# Patient Record
Sex: Male | Born: 1975 | Race: White | Hispanic: No | Smoking: Current every day smoker
Health system: Southern US, Community
[De-identification: ages and names within clinical notes are randomized; demographics above are authoritative.]

## PROBLEM LIST (undated history)

## (undated) DIAGNOSIS — K219 Gastro-esophageal reflux disease without esophagitis: Secondary | ICD-10-CM

## (undated) DIAGNOSIS — M533 Sacrococcygeal disorders, not elsewhere classified: Secondary | ICD-10-CM

## (undated) DIAGNOSIS — M5126 Other intervertebral disc displacement, lumbar region: Secondary | ICD-10-CM

## (undated) DIAGNOSIS — M47816 Spondylosis without myelopathy or radiculopathy, lumbar region: Secondary | ICD-10-CM

## (undated) DIAGNOSIS — IMO0002 Reserved for concepts with insufficient information to code with codable children: Secondary | ICD-10-CM

## (undated) DIAGNOSIS — M545 Low back pain, unspecified: Secondary | ICD-10-CM

## (undated) HISTORY — DX: Sacrococcygeal disorders, not elsewhere classified: M53.3

## (undated) HISTORY — DX: Reserved for concepts with insufficient information to code with codable children: IMO0002

## (undated) HISTORY — DX: Low back pain, unspecified: M54.50

## (undated) HISTORY — DX: Other intervertebral disc displacement, lumbar region: M51.26

## (undated) HISTORY — DX: Low back pain: M54.5

## (undated) HISTORY — DX: Spondylosis without myelopathy or radiculopathy, lumbar region: M47.816

---

## 2002-11-18 ENCOUNTER — Emergency Department (HOSPITAL_COMMUNITY): Admission: EM | Admit: 2002-11-18 | Discharge: 2002-11-19 | Payer: Self-pay | Admitting: Emergency Medicine

## 2002-11-19 ENCOUNTER — Encounter: Payer: Self-pay | Admitting: Emergency Medicine

## 2005-04-06 ENCOUNTER — Emergency Department (HOSPITAL_COMMUNITY): Admission: EM | Admit: 2005-04-06 | Discharge: 2005-04-06 | Payer: Self-pay | Admitting: Emergency Medicine

## 2005-07-11 ENCOUNTER — Ambulatory Visit (HOSPITAL_COMMUNITY): Admission: RE | Admit: 2005-07-11 | Discharge: 2005-07-11 | Payer: Self-pay | Admitting: Family Medicine

## 2005-10-04 ENCOUNTER — Ambulatory Visit: Payer: Self-pay | Admitting: Physical Medicine & Rehabilitation

## 2005-10-04 ENCOUNTER — Encounter
Admission: RE | Admit: 2005-10-04 | Discharge: 2006-01-02 | Payer: Self-pay | Admitting: Physical Medicine & Rehabilitation

## 2005-10-14 ENCOUNTER — Encounter
Admission: RE | Admit: 2005-10-14 | Discharge: 2005-11-18 | Payer: Self-pay | Admitting: Physical Medicine & Rehabilitation

## 2005-11-08 ENCOUNTER — Ambulatory Visit: Payer: Self-pay | Admitting: Physical Medicine & Rehabilitation

## 2005-12-09 ENCOUNTER — Ambulatory Visit: Payer: Self-pay | Admitting: Physical Medicine & Rehabilitation

## 2006-02-03 ENCOUNTER — Encounter
Admission: RE | Admit: 2006-02-03 | Discharge: 2006-05-04 | Payer: Self-pay | Admitting: Physical Medicine & Rehabilitation

## 2006-02-03 ENCOUNTER — Ambulatory Visit: Payer: Self-pay | Admitting: Physical Medicine & Rehabilitation

## 2006-05-01 ENCOUNTER — Ambulatory Visit: Payer: Self-pay | Admitting: Physical Medicine & Rehabilitation

## 2006-05-01 ENCOUNTER — Encounter
Admission: RE | Admit: 2006-05-01 | Discharge: 2006-07-30 | Payer: Self-pay | Admitting: Physical Medicine & Rehabilitation

## 2006-05-27 ENCOUNTER — Ambulatory Visit: Payer: Self-pay | Admitting: Physical Medicine & Rehabilitation

## 2006-07-23 ENCOUNTER — Ambulatory Visit: Payer: Self-pay | Admitting: Physical Medicine & Rehabilitation

## 2006-08-13 ENCOUNTER — Encounter
Admission: RE | Admit: 2006-08-13 | Discharge: 2006-11-11 | Payer: Self-pay | Admitting: Physical Medicine & Rehabilitation

## 2006-09-17 ENCOUNTER — Ambulatory Visit: Payer: Self-pay | Admitting: Physical Medicine & Rehabilitation

## 2006-11-12 ENCOUNTER — Encounter
Admission: RE | Admit: 2006-11-12 | Discharge: 2007-02-10 | Payer: Self-pay | Admitting: Physical Medicine & Rehabilitation

## 2006-11-12 ENCOUNTER — Ambulatory Visit: Payer: Self-pay | Admitting: Physical Medicine & Rehabilitation

## 2007-01-07 ENCOUNTER — Emergency Department (HOSPITAL_COMMUNITY): Admission: EM | Admit: 2007-01-07 | Discharge: 2007-01-07 | Payer: Self-pay | Admitting: Emergency Medicine

## 2007-01-09 ENCOUNTER — Ambulatory Visit: Payer: Self-pay | Admitting: Physical Medicine & Rehabilitation

## 2007-01-27 ENCOUNTER — Emergency Department (HOSPITAL_COMMUNITY): Admission: EM | Admit: 2007-01-27 | Discharge: 2007-01-27 | Payer: Self-pay | Admitting: Emergency Medicine

## 2007-02-01 ENCOUNTER — Ambulatory Visit (HOSPITAL_COMMUNITY): Admission: RE | Admit: 2007-02-01 | Discharge: 2007-02-01 | Payer: Self-pay | Admitting: Family Medicine

## 2007-02-10 ENCOUNTER — Inpatient Hospital Stay (HOSPITAL_COMMUNITY): Admission: RE | Admit: 2007-02-10 | Discharge: 2007-02-12 | Payer: Self-pay | Admitting: Orthopedic Surgery

## 2007-02-25 ENCOUNTER — Encounter
Admission: RE | Admit: 2007-02-25 | Discharge: 2007-03-02 | Payer: Self-pay | Admitting: Physical Medicine & Rehabilitation

## 2007-03-02 ENCOUNTER — Ambulatory Visit: Payer: Self-pay | Admitting: Physical Medicine & Rehabilitation

## 2007-03-30 ENCOUNTER — Encounter
Admission: RE | Admit: 2007-03-30 | Discharge: 2007-06-28 | Payer: Self-pay | Admitting: Physical Medicine & Rehabilitation

## 2007-03-30 ENCOUNTER — Ambulatory Visit: Payer: Self-pay | Admitting: Physical Medicine & Rehabilitation

## 2007-05-08 ENCOUNTER — Ambulatory Visit (HOSPITAL_COMMUNITY): Admission: RE | Admit: 2007-05-08 | Discharge: 2007-05-08 | Payer: Self-pay | Admitting: Orthopedic Surgery

## 2007-05-08 ENCOUNTER — Encounter (INDEPENDENT_AMBULATORY_CARE_PROVIDER_SITE_OTHER): Payer: Self-pay | Admitting: Orthopedic Surgery

## 2007-05-08 ENCOUNTER — Ambulatory Visit: Payer: Self-pay | Admitting: Surgery

## 2007-05-18 ENCOUNTER — Ambulatory Visit: Payer: Self-pay | Admitting: Physical Medicine & Rehabilitation

## 2007-06-29 ENCOUNTER — Encounter: Admission: RE | Admit: 2007-06-29 | Discharge: 2007-09-27 | Payer: Self-pay | Admitting: Urology

## 2007-07-01 ENCOUNTER — Ambulatory Visit: Payer: Self-pay | Admitting: Physical Medicine & Rehabilitation

## 2007-07-29 ENCOUNTER — Ambulatory Visit: Payer: Self-pay | Admitting: Physical Medicine & Rehabilitation

## 2007-08-27 ENCOUNTER — Ambulatory Visit: Payer: Self-pay | Admitting: Physical Medicine & Rehabilitation

## 2007-09-29 ENCOUNTER — Encounter
Admission: RE | Admit: 2007-09-29 | Discharge: 2007-11-27 | Payer: Self-pay | Admitting: Physical Medicine & Rehabilitation

## 2007-09-30 ENCOUNTER — Ambulatory Visit: Payer: Self-pay | Admitting: Physical Medicine & Rehabilitation

## 2007-10-28 ENCOUNTER — Ambulatory Visit: Payer: Self-pay | Admitting: Physical Medicine & Rehabilitation

## 2007-11-27 ENCOUNTER — Ambulatory Visit: Payer: Self-pay | Admitting: Physical Medicine & Rehabilitation

## 2007-12-25 ENCOUNTER — Encounter
Admission: RE | Admit: 2007-12-25 | Discharge: 2008-03-24 | Payer: Self-pay | Admitting: Physical Medicine & Rehabilitation

## 2007-12-28 ENCOUNTER — Ambulatory Visit: Payer: Self-pay | Admitting: Physical Medicine & Rehabilitation

## 2008-01-27 ENCOUNTER — Ambulatory Visit: Payer: Self-pay | Admitting: Physical Medicine & Rehabilitation

## 2008-02-24 ENCOUNTER — Ambulatory Visit: Payer: Self-pay | Admitting: Physical Medicine & Rehabilitation

## 2008-03-11 HISTORY — PX: KNEE SURGERY: SHX244

## 2008-03-30 ENCOUNTER — Ambulatory Visit: Payer: Self-pay | Admitting: Physical Medicine & Rehabilitation

## 2008-03-30 ENCOUNTER — Encounter
Admission: RE | Admit: 2008-03-30 | Discharge: 2008-06-15 | Payer: Self-pay | Admitting: Physical Medicine & Rehabilitation

## 2008-04-27 ENCOUNTER — Ambulatory Visit: Payer: Self-pay | Admitting: Physical Medicine & Rehabilitation

## 2008-05-25 ENCOUNTER — Ambulatory Visit: Payer: Self-pay | Admitting: Physical Medicine & Rehabilitation

## 2008-06-15 ENCOUNTER — Encounter
Admission: RE | Admit: 2008-06-15 | Discharge: 2008-06-21 | Payer: Self-pay | Admitting: Physical Medicine & Rehabilitation

## 2008-06-21 ENCOUNTER — Ambulatory Visit: Payer: Self-pay | Admitting: Physical Medicine & Rehabilitation

## 2008-08-10 ENCOUNTER — Encounter
Admission: RE | Admit: 2008-08-10 | Discharge: 2008-11-08 | Payer: Self-pay | Admitting: Physical Medicine & Rehabilitation

## 2008-08-10 ENCOUNTER — Ambulatory Visit: Payer: Self-pay | Admitting: Physical Medicine & Rehabilitation

## 2008-08-16 IMAGING — CR DG CHEST 2V
2 series · 2 of 2 positions shown · non-contrast
Comparison: none

CLINICAL DATA: PCL avulsion, preop. Tobacco use.

Chest 2 view:
Comparison CT 01/27/2007. The heart size and mediastinal contours are within
normal limits.  Both lungs are clear.  The visualized skeletal structures are
unremarkable.

[view not recorded (1 of 2)]
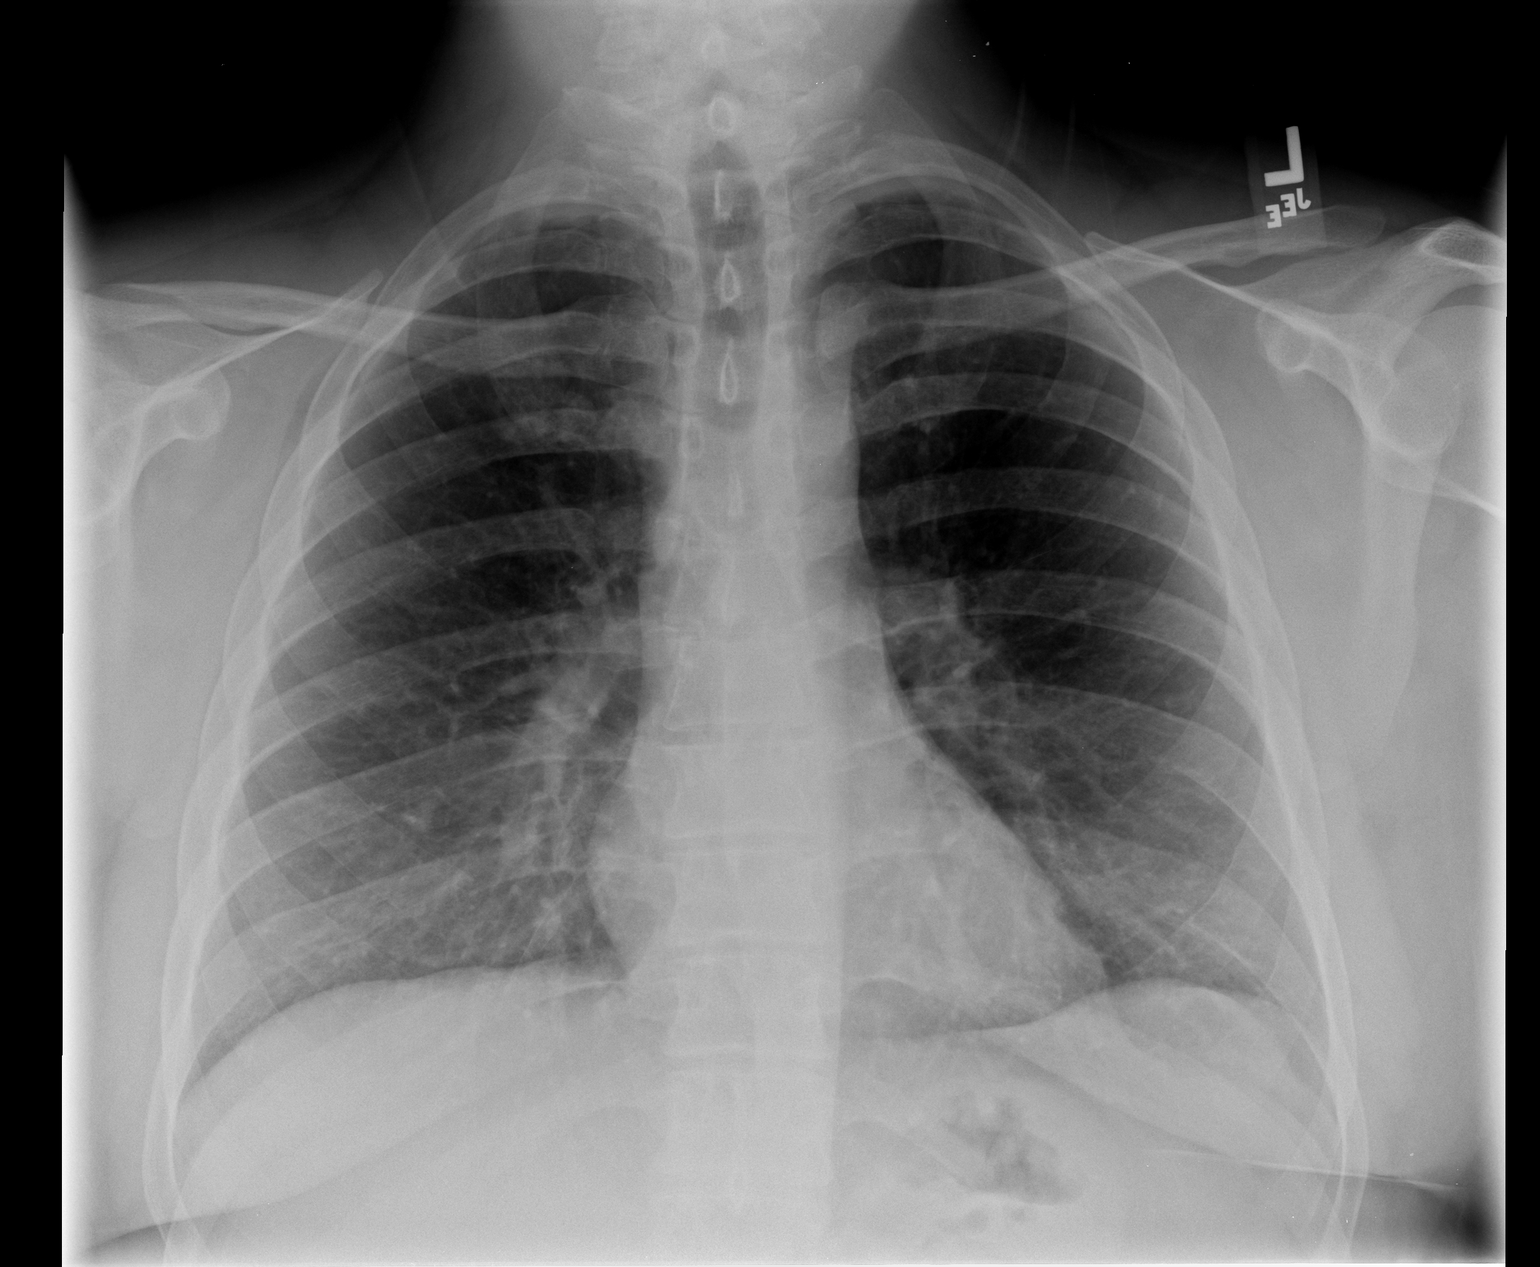

[view not recorded (2 of 2)]
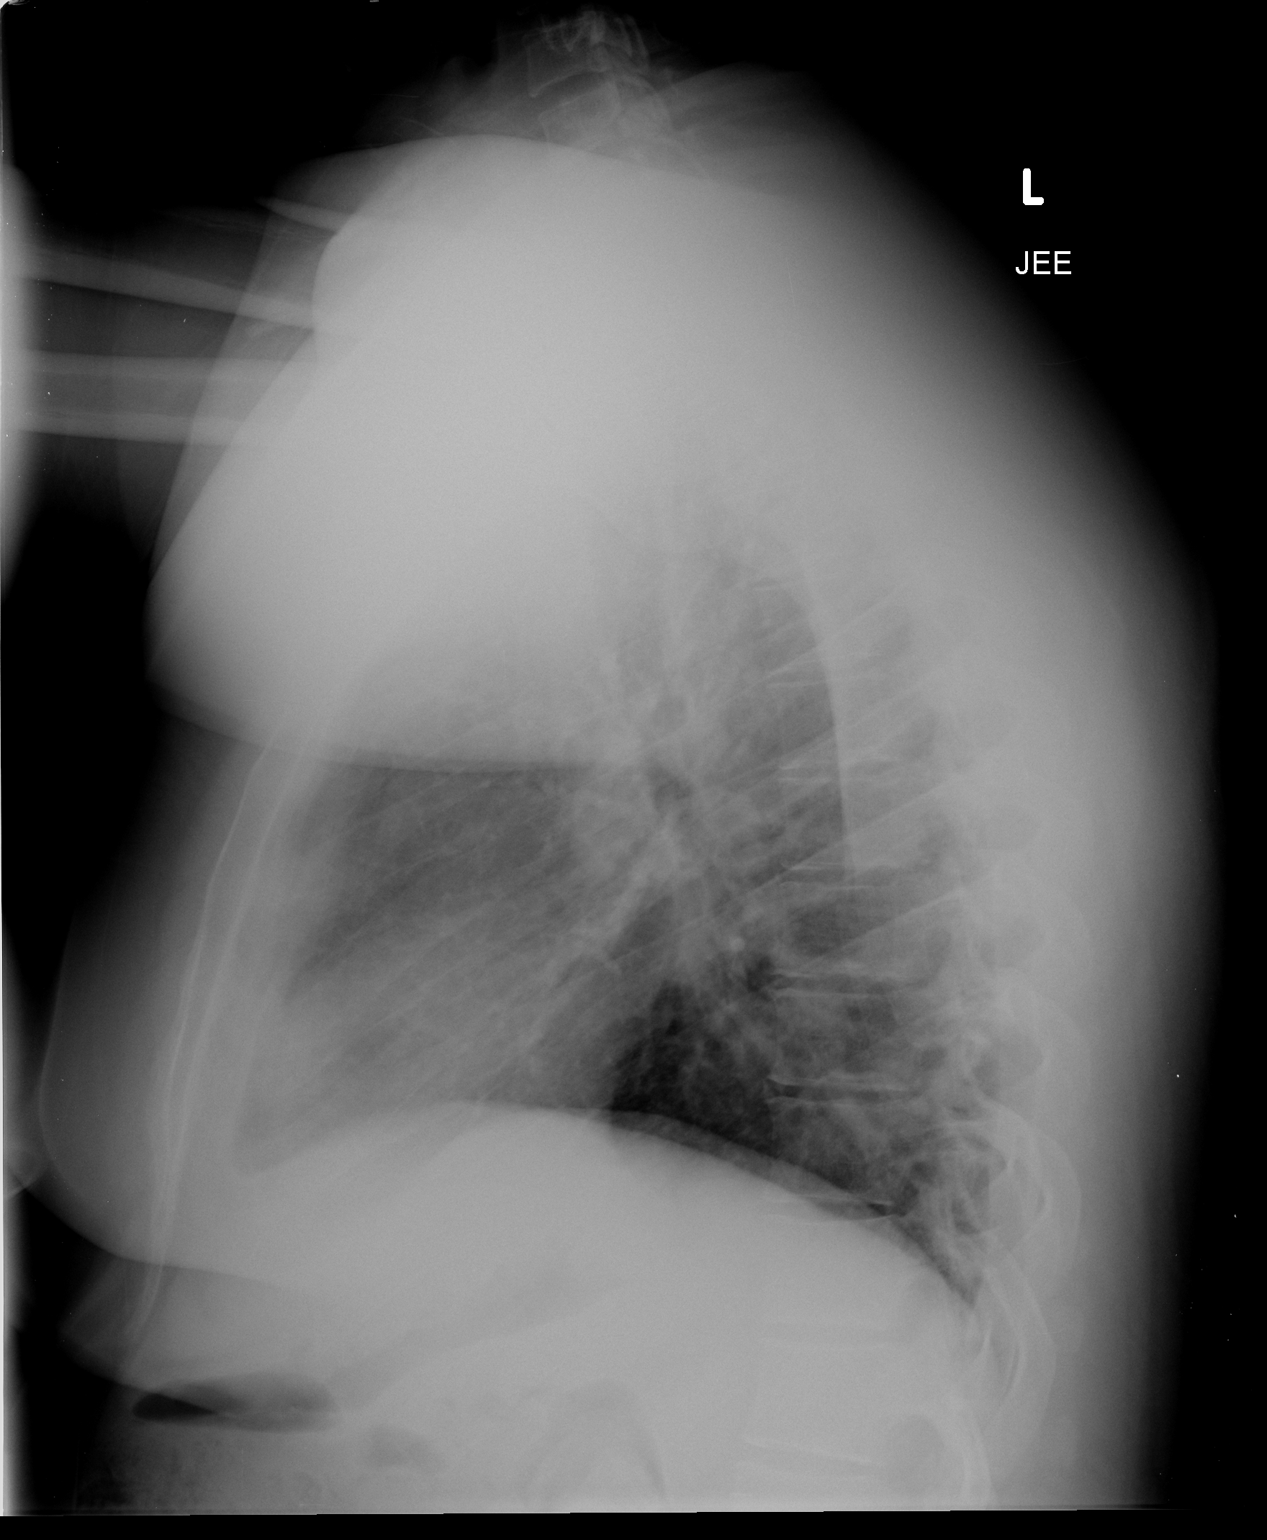

[2 of 2 positions shown; findings below may reference images not displayed]

IMPRESSION: 1. No active cardiopulmonary disease.

## 2008-08-22 IMAGING — CR DG KNEE 1-2V PORT*L*
2 series · 2 of 2 positions shown · non-contrast
Comparison: none

CLINICAL DATA: Left PCL avulsion.  Post-op.
 LEFT KNEE ? 2 VIEWS:
 Limb is in a plaster cast.  There is a fully-threaded screw extending from posterior to anterior within the proximal left tibial metaphysis.  Screw extends slightly downward and medially.

[ap/obl knee]
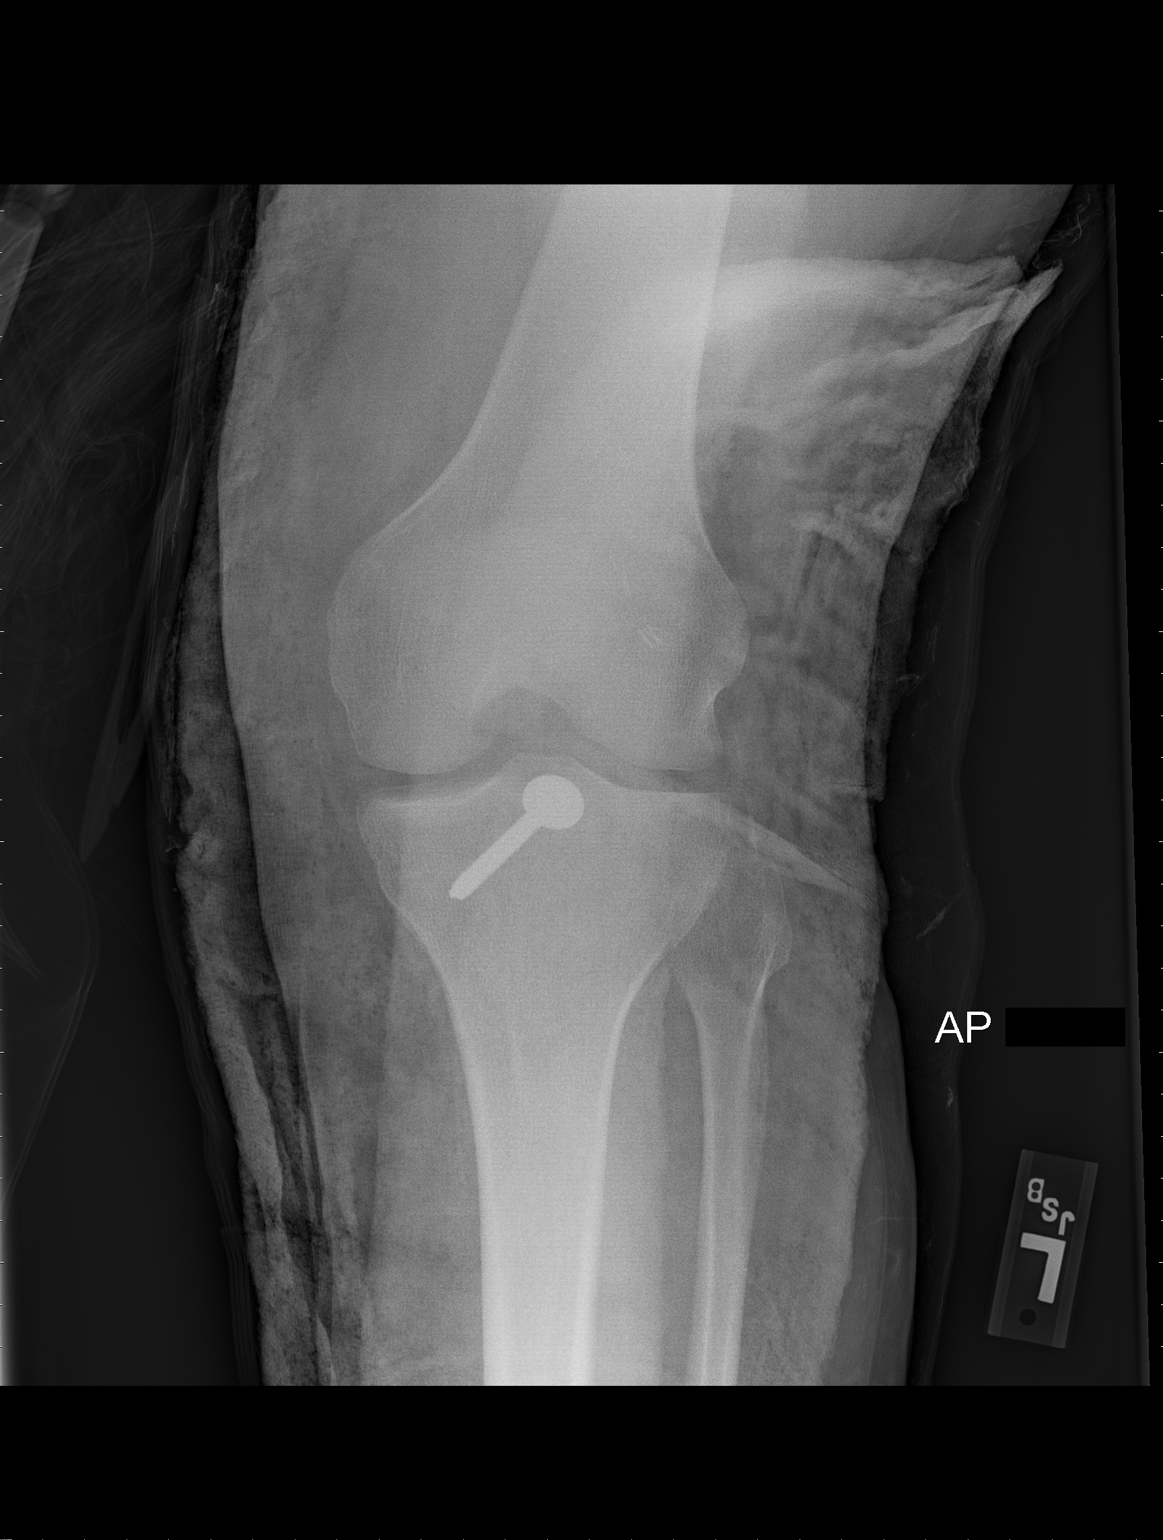

[knee lat]
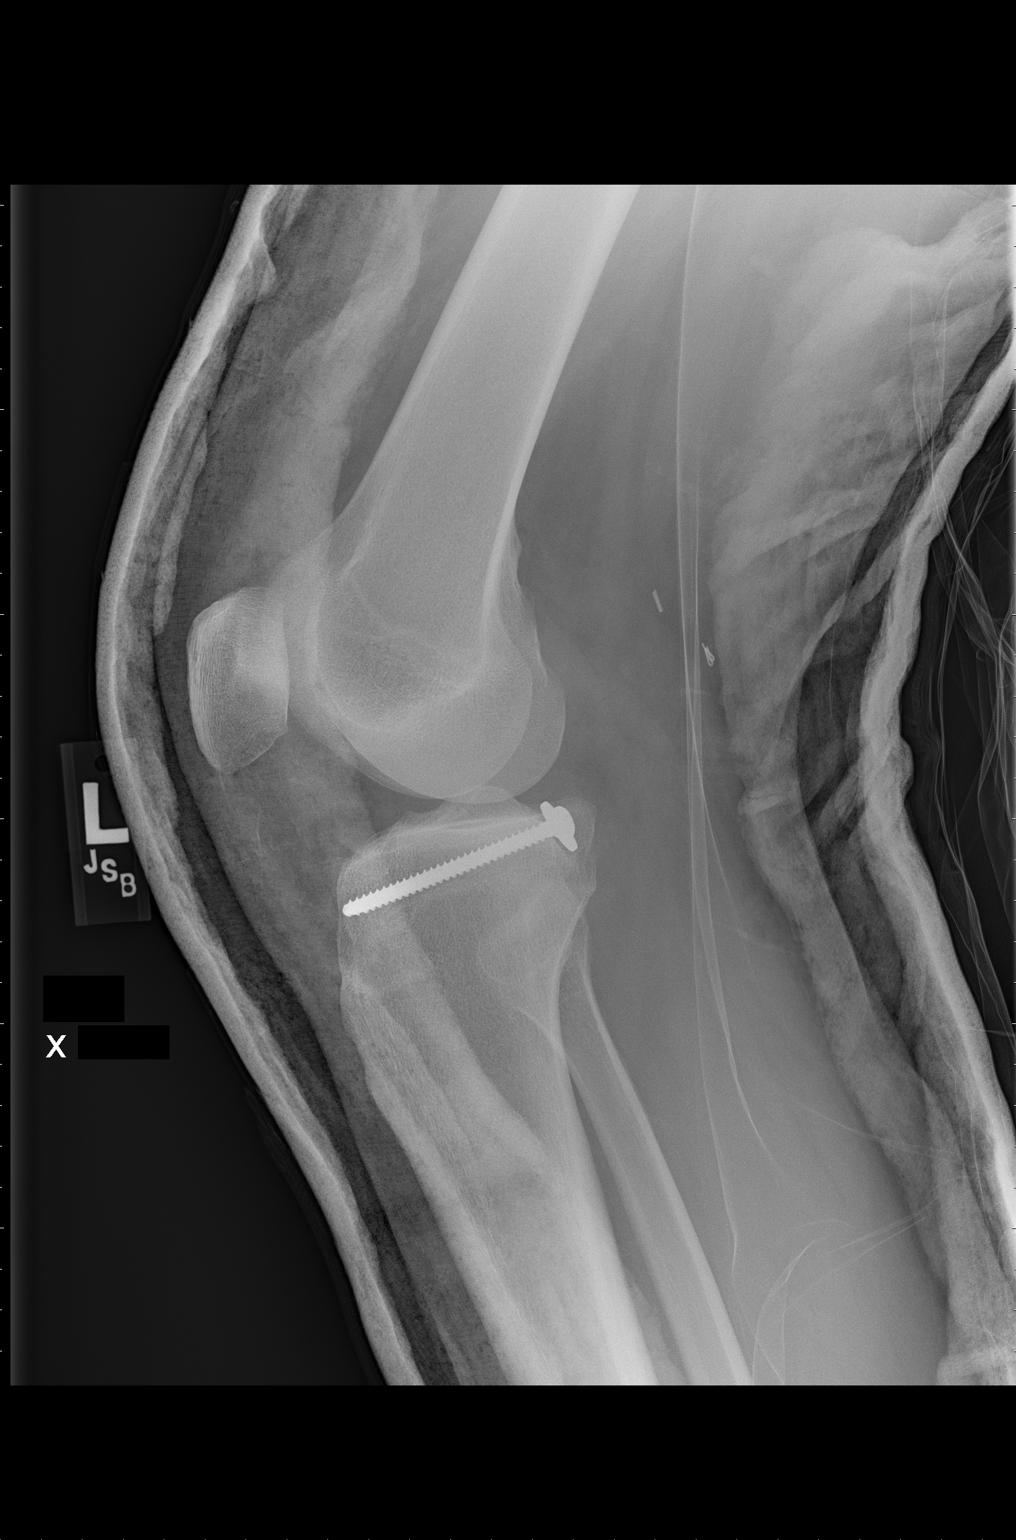

[2 of 2 positions shown; findings below may reference images not displayed]

IMPRESSION: Status post placement of proximal left tibial metaphyseal screw for reduction of reported PCL avulsion.

## 2008-08-22 IMAGING — RF DG TIBIA/FIBULA 2V*L*
1 series · 2 of 2 positions shown · non-contrast
Comparison: none

CLINICAL DATA: Posterior cruciate ligament avulsion.
 LEFT TIBIA/FIBULA ? 2 VIEW:

[Series 1: run · 2 of 2 slices shown]
[im 1/2]
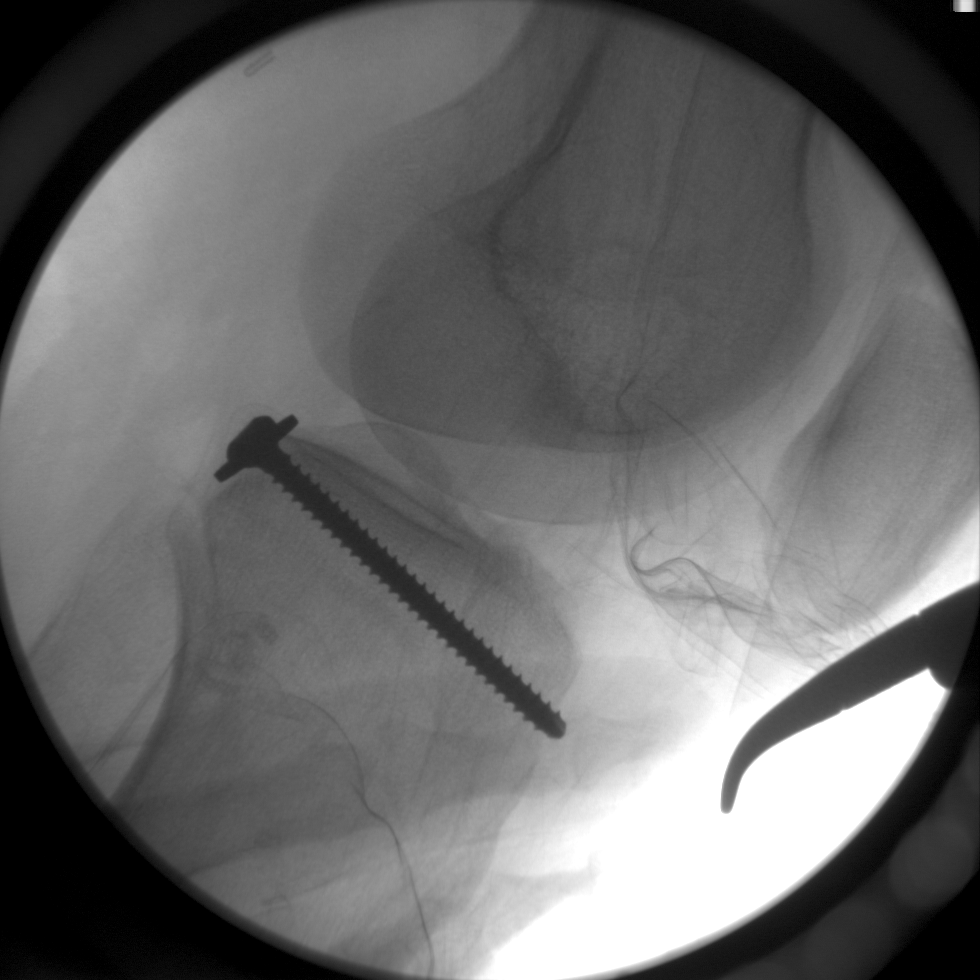
[im 2/2]
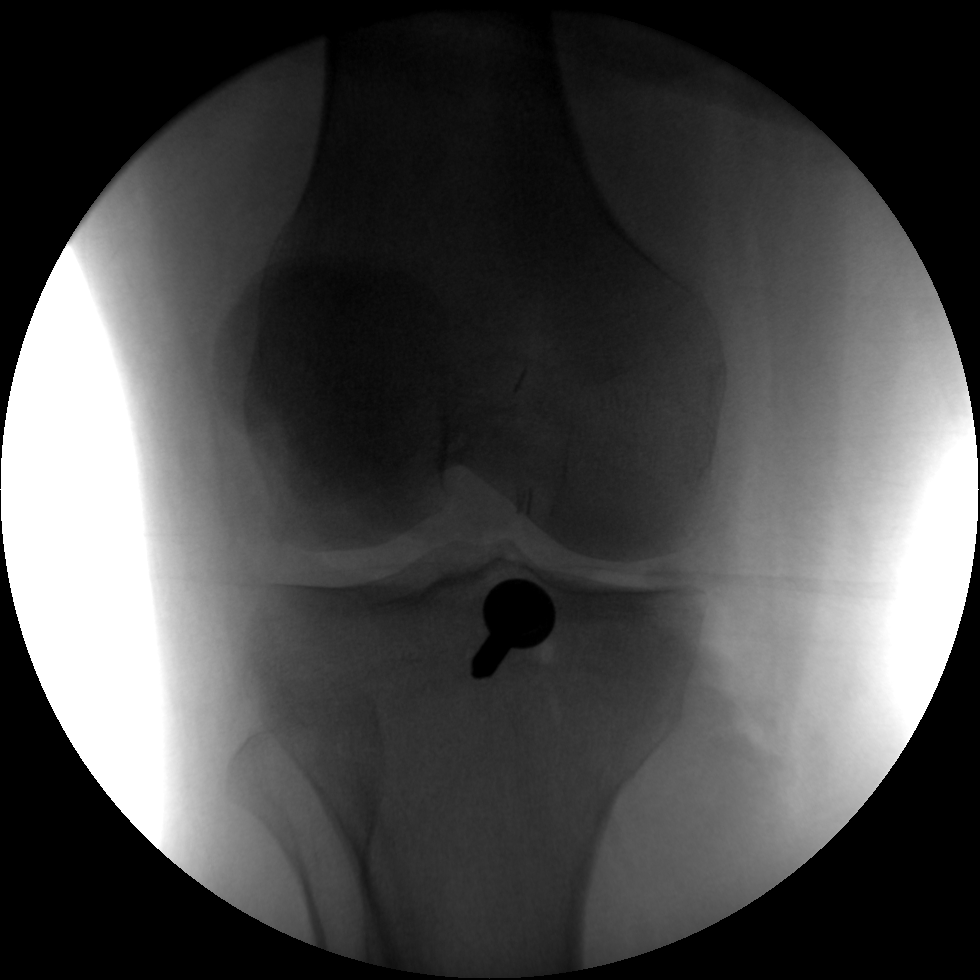

[2 of 2 positions shown; findings below may reference images not displayed]

FINDINGS: Two C-arm spot films of the left knee were obtained.  These show placement of screw for fixation of the left posterior cruciate ligament.
IMPRESSION: Fixation screw posteriorly within the proximal left tibia.

## 2008-09-07 ENCOUNTER — Ambulatory Visit: Payer: Self-pay | Admitting: Physical Medicine & Rehabilitation

## 2008-10-06 ENCOUNTER — Ambulatory Visit: Payer: Self-pay | Admitting: Physical Medicine & Rehabilitation

## 2008-11-04 ENCOUNTER — Encounter
Admission: RE | Admit: 2008-11-04 | Discharge: 2009-01-27 | Payer: Self-pay | Admitting: Physical Medicine & Rehabilitation

## 2008-11-07 ENCOUNTER — Ambulatory Visit: Payer: Self-pay | Admitting: Physical Medicine & Rehabilitation

## 2008-12-06 ENCOUNTER — Ambulatory Visit: Payer: Self-pay | Admitting: Physical Medicine & Rehabilitation

## 2009-01-03 ENCOUNTER — Ambulatory Visit: Payer: Self-pay | Admitting: Physical Medicine & Rehabilitation

## 2009-01-27 ENCOUNTER — Encounter
Admission: RE | Admit: 2009-01-27 | Discharge: 2009-03-01 | Payer: Self-pay | Admitting: Physical Medicine & Rehabilitation

## 2009-01-30 ENCOUNTER — Ambulatory Visit: Payer: Self-pay | Admitting: Physical Medicine & Rehabilitation

## 2009-03-01 ENCOUNTER — Ambulatory Visit: Payer: Self-pay | Admitting: Physical Medicine & Rehabilitation

## 2009-03-23 ENCOUNTER — Encounter
Admission: RE | Admit: 2009-03-23 | Discharge: 2009-06-20 | Payer: Self-pay | Admitting: Physical Medicine & Rehabilitation

## 2009-03-29 ENCOUNTER — Ambulatory Visit: Payer: Self-pay | Admitting: Physical Medicine & Rehabilitation

## 2009-04-26 ENCOUNTER — Ambulatory Visit: Payer: Self-pay | Admitting: Physical Medicine & Rehabilitation

## 2009-05-25 ENCOUNTER — Ambulatory Visit: Payer: Self-pay | Admitting: Physical Medicine & Rehabilitation

## 2009-06-20 ENCOUNTER — Encounter
Admission: RE | Admit: 2009-06-20 | Discharge: 2009-09-12 | Payer: Self-pay | Admitting: Physical Medicine & Rehabilitation

## 2009-06-22 ENCOUNTER — Ambulatory Visit: Payer: Self-pay | Admitting: Physical Medicine & Rehabilitation

## 2009-07-20 ENCOUNTER — Ambulatory Visit: Payer: Self-pay | Admitting: Physical Medicine & Rehabilitation

## 2009-08-21 ENCOUNTER — Ambulatory Visit: Payer: Self-pay | Admitting: Physical Medicine & Rehabilitation

## 2009-09-12 ENCOUNTER — Encounter
Admission: RE | Admit: 2009-09-12 | Discharge: 2009-12-11 | Payer: Self-pay | Admitting: Physical Medicine & Rehabilitation

## 2009-09-18 ENCOUNTER — Ambulatory Visit: Payer: Self-pay | Admitting: Physical Medicine & Rehabilitation

## 2009-11-02 ENCOUNTER — Ambulatory Visit: Payer: Self-pay | Admitting: Physical Medicine & Rehabilitation

## 2009-11-30 ENCOUNTER — Ambulatory Visit: Payer: Self-pay | Admitting: Physical Medicine & Rehabilitation

## 2009-12-21 ENCOUNTER — Encounter
Admission: RE | Admit: 2009-12-21 | Discharge: 2010-02-22 | Payer: Self-pay | Source: Home / Self Care | Attending: Physical Medicine & Rehabilitation | Admitting: Physical Medicine & Rehabilitation

## 2009-12-29 ENCOUNTER — Ambulatory Visit: Payer: Self-pay | Admitting: Physical Medicine & Rehabilitation

## 2010-01-25 ENCOUNTER — Ambulatory Visit: Payer: Self-pay | Admitting: Physical Medicine & Rehabilitation

## 2010-02-22 ENCOUNTER — Ambulatory Visit: Payer: Self-pay | Admitting: Physical Medicine & Rehabilitation

## 2010-03-26 ENCOUNTER — Encounter
Admission: RE | Admit: 2010-03-26 | Discharge: 2010-04-10 | Payer: Self-pay | Source: Home / Self Care | Attending: Physical Medicine & Rehabilitation | Admitting: Physical Medicine & Rehabilitation

## 2010-03-28 ENCOUNTER — Ambulatory Visit: Admit: 2010-03-28 | Payer: Self-pay | Admitting: Physical Medicine & Rehabilitation

## 2010-04-24 ENCOUNTER — Ambulatory Visit (HOSPITAL_BASED_OUTPATIENT_CLINIC_OR_DEPARTMENT_OTHER): Payer: Self-pay | Admitting: Physical Medicine & Rehabilitation

## 2010-04-24 ENCOUNTER — Encounter: Payer: Self-pay | Attending: Physical Medicine & Rehabilitation

## 2010-04-24 DIAGNOSIS — M5126 Other intervertebral disc displacement, lumbar region: Secondary | ICD-10-CM

## 2010-04-24 DIAGNOSIS — M79609 Pain in unspecified limb: Secondary | ICD-10-CM | POA: Insufficient documentation

## 2010-04-24 DIAGNOSIS — G5600 Carpal tunnel syndrome, unspecified upper limb: Secondary | ICD-10-CM | POA: Insufficient documentation

## 2010-04-24 DIAGNOSIS — E669 Obesity, unspecified: Secondary | ICD-10-CM | POA: Insufficient documentation

## 2010-04-24 DIAGNOSIS — IMO0002 Reserved for concepts with insufficient information to code with codable children: Secondary | ICD-10-CM

## 2010-04-24 DIAGNOSIS — G8929 Other chronic pain: Secondary | ICD-10-CM | POA: Insufficient documentation

## 2010-04-24 DIAGNOSIS — M545 Low back pain, unspecified: Secondary | ICD-10-CM | POA: Insufficient documentation

## 2010-04-24 DIAGNOSIS — M129 Arthropathy, unspecified: Secondary | ICD-10-CM | POA: Insufficient documentation

## 2010-04-24 DIAGNOSIS — M47817 Spondylosis without myelopathy or radiculopathy, lumbosacral region: Secondary | ICD-10-CM | POA: Insufficient documentation

## 2010-04-24 DIAGNOSIS — M533 Sacrococcygeal disorders, not elsewhere classified: Secondary | ICD-10-CM

## 2010-04-24 DIAGNOSIS — M5380 Other specified dorsopathies, site unspecified: Secondary | ICD-10-CM

## 2010-06-21 ENCOUNTER — Encounter: Payer: Self-pay | Attending: Physical Medicine & Rehabilitation

## 2010-06-21 DIAGNOSIS — M47817 Spondylosis without myelopathy or radiculopathy, lumbosacral region: Secondary | ICD-10-CM | POA: Insufficient documentation

## 2010-06-21 DIAGNOSIS — M5380 Other specified dorsopathies, site unspecified: Secondary | ICD-10-CM

## 2010-06-21 DIAGNOSIS — IMO0002 Reserved for concepts with insufficient information to code with codable children: Secondary | ICD-10-CM | POA: Insufficient documentation

## 2010-06-21 DIAGNOSIS — M5126 Other intervertebral disc displacement, lumbar region: Secondary | ICD-10-CM

## 2010-06-21 DIAGNOSIS — E669 Obesity, unspecified: Secondary | ICD-10-CM | POA: Insufficient documentation

## 2010-06-21 DIAGNOSIS — M533 Sacrococcygeal disorders, not elsewhere classified: Secondary | ICD-10-CM

## 2010-06-21 DIAGNOSIS — M549 Dorsalgia, unspecified: Secondary | ICD-10-CM | POA: Insufficient documentation

## 2010-07-24 NOTE — Assessment & Plan Note (Signed)
Joshua [name] is back regarding his low back pain.  Pain has been generally  stable.  He is out of work currently due to economy.  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  He is  keeping up with his exercise and activities.  Left knee is doing well.  He stopped taking the Flexeril due to cough and the fact that that  really was not helping him that much.  He rates his pain 5/10.  Described as stabbing and constant.  Pain in the low back with some  radiation to the left posterior leg.  Sleep is fair.  Pain interferes  with his general activity, relations with others, and enjoyment of life  on a moderate level.   REVIEW OF SYSTEMS:  Notable for the above.  He does report some  constipation which is managed with diet and occasional medications.  Full review is in the written health and history section.   SOCIAL HISTORY:  As noted above.  He is single and lives with male.  He does smoke about 1 pack a day.   PHYSICAL EXAMINATION:  VITAL SIGNS:  Blood pressure is 137/85, pulse 69,  respiratory rate 18, and he is saturating 97% on room air.  GENERAL:  The patient is pleasant, alert, and oriented x3.  Weight is  improved.  NEUROLOGIC:  He is walking more evenly with less antalgia to the left.  He had minimal crepitus at the left knee with full range of motion  today.  Strength is 5/5.  Sensory exam is intact.  Straight leg testing  was negative.  HEART:  Regular rate.  CHEST:  Clear.  ABDOMEN:  Soft and nontender.   ASSESSMENT:  1. Chronic low back pain related to facet arthropathy and lumbar      spondylosis.  He has a mild radiculitis in the left as well.  2. Obesity.  3. Carpal tunnel syndrome.  4. Anterior cruciate ligament tear with avulsion injury.   PLAN:  1. Continue range of motion exercising.  2. Refill methadone and Percocet 20 b.i.d. and 1 every 6 hours p.r.n.      scheduling, #120 and #90 respectively.  3. Follow up with nursing next  month with urine drug screen.  I will      see him back in 3 months.      Ranelle Oyster, M.D.  Electronically Signed     ZTS/MedQ  D:  09/30/2007 09:30:39  T:  10/01/2007 00:20:27  Job #:  [id number]

## 2010-07-24 NOTE — Assessment & Plan Note (Signed)
Joshua [name] is back regarding his back pain.  Over the last 2-1/2 weeks, he  notes that his back pain is increased with pain radiating more severely  down the back of the leg and now to his foot.  He does not note any  specific events that caused increase in his pain.  He has tried to  stretch and walk, but it seems to be making his pain worse.  He remains  on methadone 10 mg 2 tablets b.i.d. as well as Percocet 7.5 one q.6 h.  p.r.n.  He rates his pain as 7-9/10 described as stabbing, aching.  Pain  interferes with general activity, relations with others, enjoyment of  life on a moderate to severe level.  Sleep is poor.  He can walk about  20 minutes at a time without having to stop.  He last worked  approximately a year ago.   REVIEW OF SYSTEMS:  Notable for occasional spasms, limb swelling.  Weight is not changed dramatically.  Full review is in the written  health and history section of the chart.   SOCIAL HISTORY:  The patient is single, living with mother.  He smokes  one-pack of cigarettes per day.  He is not having luck in finding work  specifically due to the fact that he is on narcotic medications.   PHYSICAL EXAMINATION:  VITAL SIGNS:  Blood pressure 126/72, pulse is 80,  respiratory rate 18.  He is sating 98% on room air.  GENERAL:  The patient is generally alert and appropriate and he remains  significantly overweight.  EXTREMITIES:  Sensation is intact in both extremities with 2+ reflexes.  Coordination is normal.  The patient bents at the waist and had pain in  the low back and left leg.  He has pain also with lateral bending  rotating and extension to a lesser extent.  Straight leg testing was  positive on the left side today.  The patient had lower lumbosacral  paraspinal discomfort with palpation and stretching.  Left PSIS area was  tender today.  HEART:  Regular.  CHEST:  Clear.  ABDOMEN:  Soft, nontender.   ASSESSMENT:  1. Chronic lumbar spondylosis with facet  arthropathy.  The patient now      with increased left leg pain concerning for radiculitis.  He has      had some radicular symptoms the past, but they have not been as      persistent and in fact had improved as of recent times.  2. Obesity.  3. Carpal tunnel syndrome.  4. History of anterior cruciate ligament tear and avulsion injury,      left lower extremity.   PLAN:  1. The patient cannot afford MRI at this point.  We will check his      last MRI again to verify results.  2. We will place the patient on oral prednisone taper over 2 weeks'      time.  3. I will refill his methadone and Percocet for February.  4. He needs to continue working on weight loss and range of motion as      well as aerobic exercise tolerance.  5. 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  He may      require training and education to meet responsibilities of      sedentary job.      Ranelle Oyster, M.D.  Electronically Signed     ZTS/MedQ  D:  03/30/2008 11:05:26  T:  03/31/2008  00:22:13  Job #:  [id number]

## 2010-07-24 NOTE — Assessment & Plan Note (Signed)
HISTORY OF PRESENT ILLNESS:  Joshua [name] is back regarding his chronic low back  pain and radiculopathy.  He states he has been generally stable on his  current regimen.  He feels the Tegretol and Elavil helped his left leg  quite a bit with spasms.  He is sleeping a bit better at night.  His  pain is 5/10 today.  He is on methadone 20 mg b.i.d. and Percocet 1 q.6  h p.r.n.  He has not found any work yet.  He still is trying, however.  He is  trying to exercise on a regular basis in the form of stretches that he  has learned in therapy.   REVIEW OF SYSTEMS:  Notable for the above.  For full-14 point review is  in the written health and history section of the chart.   SOCIAL HISTORY:  Unchanged.  He is living alone with his children.   PHYSICAL EXAMINATION:  VITAL SIGNS:  Blood pressure is 160/75, pulse 94,  respiratory rate 18, he is sating 96% on room air.  GENERAL:  The patient is pleasant, alert, and oriented x3.  Affect is  bright and appropriate.  BACK:  He tends to lean a bit to the left to accommodate some of his  back pain.  He does stand with these.  He is able to nearly touch his  toe forward flexion still favoring a left side a bit.  Extension has  seemed to cause in fact more pain today in any other movements  particularly in the left side.  He had some pain with palpation of the  left lower lumbar paraspinal muscles.  Strength is 5/5 in all 4  extremities with 2+ reflexes and normal sensory exam.  HEART:  Regular.  CHEST:  Clear.  ABDOMEN:  Soft, nontender.  NEUROLOGIC:  Cognition is appropriate.   ASSESSMENT:  1. Chronic lumbar spondylosis with facet arthropathy in S1      radiculitis.  2. Obesity.  3. Carpal tunnel syndrome.  4. History of anterior cruciate ligament tear on the left.   PLAN:  1. We will continue with Tegretol, Elavil, methadone, and Percocet at      current doses.  I told the patient that I want to check a Tegretol      level and CBC in a few months.   I asked the staff to estimate an      cost for him out of pocket.  2. Encouraged exercise and weight loss.  3. I will see him back in 3 months with 40-month nursing followup.      Ranelle Oyster, M.D.  Electronically Signed     ZTS/MedQ  D:  11/07/2008 95:62:13  T:  11/08/2008 02:08:55  Job #:  [id number]
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
## 2010-07-24 NOTE — Assessment & Plan Note (Signed)
Joshua [name] is back regarding his back pain.  He has had increasing pain on the  left leg primarily in the posterior aspect.  He still is self-pay and is  unable to pay for MRI.  We had discussed the exercise and had actually  place him on a prednisone taper in January and this helped for a bit,  but nothing long lasting.  His pain ranges from a 5-9/10, described as  constant aching.  It interferes with the general activity, relations  with others, and enjoyment of life on a moderate-to-severe level.  Sleep  is poor.  He will often has to sleep with his knees bent in a pillow in  between.  Pain increases with walking, bending, and prolonged standing,  and sitting.  He remains on methadone 20 mg b.i.d. with Percocet 7.5,  one q.6 h. p.r.n. breakthrough pain.   REVIEW OF SYSTEMS:  Notable for occasional spasms in the left leg  particularly at nighttime.  The patient's Oswestry score is 58.  Full  review is in the written health and history section.   SOCIAL HISTORY:  The patient is single and living with his mother.  He  is still smoking half-a-pack of cigarettes per day.  He has not find any  work as he states that no one will hire him due to his disability and  pain medications.   PHYSICAL EXAMINATION:  VITAL SIGNS:  Blood pressure is 140/82, pulse is  74, respiratory rate is 16, and sating 98% on room air.  GENERAL:  The patient is pleasant, alert, and oriented x3.  Affect is  only bright appropriate.  He walks with some antalgia to the left side.  Weight is increased.  EXTREMITIES:  He had pain with lumbar flexion today as well as rotation  and lateral bending especially to the left side.  Straight leg raising  was positive on the left.  Reflexes, however, were 2+ in the left ankle  and knee with intact strength and no sensory deficits noted.  The  patient has pain with palpation over the left PSIS area and associated  lumbar sacral paraspinal muscles.  HEART:  Regular.  CHEST:  Clear.  ABDOMEN:  Soft and nontender.   ASSESSMENT:  1. Chronic lumbar spondylosis with facet arthropathy and likely S1      radiculitis now.  2. Obesity.  3. Carpal tunnel syndrome.  4. History of anterior cruciate ligament tear on the left.   PLAN:  1. We will begin trial of Tegretol 200 mg nightly titrating up to      b.i.d. after 5 days time.  2. Begin trial of Elavil 20 mg nightly.  3. Refill methadone 10 mg, 2 twice a day, #120 with Percocet 1 q.6 h.      p.r.n., #90.  4. UDS was collected today.  5. Encourage exercise and activity as tolerated.  6. I will see him back pending above.      Ranelle Oyster, M.D.  Electronically Signed     ZTS/MedQ  D:  04/27/2008 11:08:52  T:  04/27/2008 23:29:56  Job #:  [id number]

## 2010-07-24 NOTE — Assessment & Plan Note (Signed)
HISTORY:  Joshua [name] is back regarding his low back pain.  He has not noted  much change since I last saw him.  It has been fairly well controlled  with his methadone and Percocet.  He is using methadone 20 mg twice a  day and Percocet 7.5/325 one q.6 h. p.r.n.  Of concerning note is a  urine drug screen we collected at last visit, which was negative for  Percocet, although he stated that the day of the collection was  November 27, 2007.  He tells me that he has been taking his medications  as prescribed, did not do otherwise.  He has not returned to work, was  looking for work, and just finds job is hard to go by at this point.  The patient states that pain is stabbing, constant, aching, in the low  back, and sometimes in the left thigh.  Pain interferes with his general  activity, in relationship with others, and enjoyment of life on a  moderate level.  He states he can walk about 30-45 minutes at a time.  He has limited his driving and climbs steps still.  He does try  stretching exercises that we have reviewed in the past.   REVIEW OF SYSTEMS:  Notable for the above.  Full review is in the  written health and history section of the chart.   SOCIAL HISTORY:  The patient is single and living with a friend.   PHYSICAL EXAMINATION:  VITAL SIGNS:  Blood pressure is stable, pulse is  70 and respiratory rate is 16, he is sating 97% on room air.  GENERAL:  The patient is pleasant, alert, and oriented x3.  Weight seems  to be a bit better.  He is walking with minimal antalgia to the left.  He has good weight shifts.  Strength is 5/5 in both legs.  Sensory exam  is intact.  He is able to bend and nearly touch his toe with minimal  discomfort today.  He had some pain with palpation over the lumbar  paraspinals, but minimally so.  Extension causes minimal discomfort.  HEART:  Regular.  CHEST:  Clear.  ABDOMEN:  Soft and nontender.   ASSESSMENT:  1. Chronic low back pain related to lumbar  spondylosis/facet      arthropathy.  2. Obesity.  3. Carpal tunnel syndrome.  4. Anterior cruciate ligament tear with avulsion injury.   PLAN:  1. Refilled methadone and Percocet today.  We will collect urine drug      screen to confirm the presence of these medications.  2. Encouraged improved exercise tolerance and better range of motion      activities.  3. I will see him back in about 3 months' time with nurse check up and      1 month for refills and screening.      Ranelle Oyster, M.D.  Electronically Signed     ZTS/MedQ  D:  12/28/2007 16:25:01  T:  12/29/2007 03:44:52  Job #:  [id number]

## 2010-07-24 NOTE — Assessment & Plan Note (Signed)
Joshua [name] is back regarding his back pain.  Back pain has generally been  stable.  He has been back at work.  His left knee gives him more  problems than anything, particularly when he has been sitting for a  while in the car.  It gets tight as a flexion.  He rates his pain as a 5-  6/10.  He describes his pain as sharp, constant, aching.  Pain  interferes with his general activity, relations with others, enjoyment  of life on a moderate level.  Sleep is poor.   REVIEW OF SYSTEMS:  Notable for the above.  He does report occasional  poor appetite and elevated sugars at times.   SOCIAL HISTORY:  Patient is working full-time as a Hospital doctor.  He is living  with his mother.   PHYSICAL EXAM:  Blood pressure is 149/77, pulse is 110, respiratory rate  18.  He is satting 98% on room air.  Patient is pleasant, alert and oriented times three.  Affect is bright  and appropriate.  He walks with a bit of a hobble on the left side, but  is not frankly antalgic to the left.  He had a pop and some crepitus  with knee extension and flexion, but had intact motor function at 5/5.  Sensory exam was stable.  Weight was generally at his baseline.  He has an equivocal straight leg test and seated slump test today.  He  had some pain with extension of the back.   ASSESSMENT:  1. Chronic low back pain, related to facet arthropathy and lumbar      spondylosis.  2. Obesity.  3. Left carpal tunnel syndrome.  4. Questionable lumbar radiculitis on the left, which appears to be      less prominent.  5. Chronic tobacco abuse.  6. History of left ACL tear with avulsion injury.   PLAN:  1. Continue strengthening and range of motion.  This seems to be his      biggest problem at this point.  2. Advised smoking cessation.  3. Continue Percocet and methadone for baseline pain control.  4. I will see him back in three months with nurse clinic followup in      one month's time.      Ranelle Oyster, M.D.  Electronically Signed     ZTS/MedQ  D:  07/01/2007 12:40:15  T:  07/01/2007 12:58:13  Job #:  [id number]

## 2010-07-24 NOTE — Assessment & Plan Note (Signed)
Joshua [name] is back regarding his chronic low back pain.  Joshua [name] states he has  been just generally stable since I last saw him.  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 He usually works about 40 hours a  week.  The pain remains in the low back and into the left leg to a  certain extent, although the pain is predominately in the back and upper  gluteal region.  He rates his pain a 4 to 6 out of 10 on an average.  He  describes it as stabbing, constant aching.  The pain interferes with  general activity, relations with others and enjoyment of life on a mild  level.  He tries to do some walking and exercising at home, but is not  regular with it due to his schedule.  He remains on methadone 20 mg  b.i.d. and Percocet 5 one q.6 hours p.r.n. He also uses p.r.n. Tylenol,  naproxen and Flexeril.  He has made no progress with his weight at this  point.  He has made some adaptations to seating in his truck and this  seems to have helped a bit with his posture and pain to a small extent.   REVIEW OF SYSTEMS:  Is notable for the above.  Other issues are  mentioned in the health and history section in the chart.   SOCIAL HISTORY:  The patient is single and living with a companion.  He  still smokes 1 pack of cigarettes per day.  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   PHYSICAL EXAMINATION:  Blood pressure 132/47, pulse is 71, respiratory  rate 18, satting 98% room air.  The patient is generally pleasant, alert and oriented x3.  Affect is  bright and appropriate.  He walks with a fairly normal gait.  He remains  overweight, particularly abdominally.  He has good flexibility and flexion, being able to touch his toes.  He  does have pain with extension, particularly with facet maneuvers to the  left and right and with lateral bending.  Strength is preserved at 5/5  in all 4 extremities.  Reflexes 2+.  Sensation is grossly intact.  HEART:  Was regular.  CHEST:  Was clear.  ABDOMEN:  Is nontender.  Cognitively he is appropriate.  No cranial nerve abnormalities are noted  today.   ASSESSMENT:  1. Chronic low back pain consistent with lumbar spondylosis and facet      arthropathy.  2. Obesity.  3. Left carpal tunnel syndrome.  4. History of tobacco use.   PLAN:  1. Continue methadone and Percocet at current doses as mentioned      above.  2. The patient will use Tylenol, naproxen and Flexeril for      breakthrough spasms and pain.  3. The patient needs to focus again on weight loss and exercise on a      regular basis and make these a priority.  4. I will see the patient back in about 3 months' time.  I would like      him to have an EKG checked to rule out any conduction abnormalities      while on the methadone. He will have this done at his family      practitioner's office.      Ranelle Oyster, M.D.  Electronically Signed     ZTS/MedQ  D:  07/25/2006 11:54:17  T:  07/25/2006 12:47:29  Job #:  [id number]  cc:   Hilda Lias, M.D.  Fax: [phone]
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
## 2010-07-24 NOTE — Assessment & Plan Note (Signed)
Joshua [name] is back regarding his chronic low back pain.  His pain remains  about a 5 out of 10, which is average for him.  He feels that it is  acceptable and is able to perform his daily activities, range of motion,  etc.  The pain is described as constant and aching.  The pain interferes  with general activity, relationships with others, and enjoyment of life  on a moderate to moderate-plus level.  Sleep is fair.  The pain  increases with walking, sitting, and standing.  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   REVIEW OF SYSTEMS:  Notable for the above as well as occasional high  sugars and constipation.  A full review is noted in the Health and  History section.   SOCIAL HISTORY:  The patient is single and still smokes one pack of  cigarettes per day.   PHYSICAL EXAMINATION:  VITAL SIGNS:  Blood pressure is 141/78, pulse is  94, respiratory rate 18, his saturation is 98% on room air.  GENERAL:  The patient is pleasant, alert and oriented x3.  Affect is  generally bright and appropriate.  He is still overweight abdominally  particularly.  PAIN AND REHAB EVALUATION:  The patient has reasonable flexion,  extension, and rotation of the lumbar spine.  His strength is 5 out of  5.  Sensation is intact.  HEART:  Regular.  CHEST:  Clear.  ABDOMEN:  Soft and nontender.   ASSESSMENT:  1. Chronic low back pain with lumbar spondylosis and facet      arthropathy.  2. Obesity.  3. Left carpal tunnel syndrome.  4. Tobacco use.   PLAN:  1. Continue medications as above.  2. We discussed once again exercise and appropriate diet.  I think he      is doing better with his diet but still not getting much in the way      of exercise.  3. I discussed following up an EKG at some point here in the next few      months, and the patient agrees.      Ranelle Oyster, M.D.  Electronically Signed     ZTS/MedQ  D:   10/17/2006 13:15:16  T:  10/17/2006 20:52:26  Job #:  [id number]   cc:   Hilda Lias, M.D.  Fax: [phone]

## 2010-07-24 NOTE — Assessment & Plan Note (Signed)
Terris is back regarding his back pain.  He has done a bit better with the  Tegretol and Elavil regarding his leg and leg spasms.  He still has some  problems with sleeping at nighttime.  He rates his pain 5-8/10.  Pain  interferes with general activity, relations with others, and enjoyment  of life on a moderate-to-severe level.  Sleep is fair.   REVIEW OF SYSTEMS:  Notable for spasms.  Other pertinent positives are  above and full review is in the written health and history section of  the chart.   SOCIAL HISTORY:  The patient is single.  He is living alone with his  children apparently.  He still smokes.   PHYSICAL EXAMINATION:  VITAL SIGNS:  Blood pressure is 142/76, pulse 84,  respiratory rate 18.  He is sating 97% on room air.  GENERAL:  The patient is pleasant, alert, and oriented x3.  EXTREMITIES:  He has less antalgia to the left.  I thought overall his  range of motion has improved and he is nearly able to touch his toes  today.  He had some pain with extension and rotation is felt on either  side.  He remained abdominally obese.  Reflexes are 2+ throughout with  normal motor and sensory function in both lower extremities today.  I  felt he had less pain with palpation over the lower lumbar spine and  PSIS areas.  HEART:  Regular rate.  CHEST:  Clear.  ABDOMEN:  Soft, nontender.   ASSESSMENT:  1. Chronic lumbar spondylosis with facet arthropathy and likely S1      radiculitis.  2. Obesity.  3. Carpal tunnel syndrome.  4. History of anterior cruciate ligament tear in the left.   PLAN:  1. He will stay with Tegretol 200 mg b.i.d.  2. Increase Elavil to 25-50 mg nightly.  3. Refill methadone 10 mg 2 tablets twice a day #120 and Percocet 1      q.6 h p.r.n. #90.  4. Check UDS at next visit.  5. I encourage better range of motion and exercise activity as a      whole.  He needs to go beyond just walking.  6. We will see him back in 3 months with nursing followup in 1  months'      time.      Ranelle Oyster, M.D.  Electronically Signed     ZTS/MedQ  D:  08/10/2008 12:44:53  T:  08/11/2008 00:40:46  Job #:  [id number]

## 2010-07-24 NOTE — Op Note (Signed)
NAME:  BARTH, TRELLA NO.:  [id number]   MEDICAL RECORD NO.:  [id number]          PATIENT TYPE:  INP   LOCATION:  5013                         FACILITY:  [facility]   PHYSICIAN:  Burnard Bunting, M.D.    DATE OF BIRTH:  15-Sep-1975   DATE OF PROCEDURE:  02/10/2007  DATE OF DISCHARGE:                               OPERATIVE REPORT   PREOPERATIVE DIAGNOSIS:  Left knee posterior cruciate ligament evulsion  fracture.   POSTOPERATIVE DIAGNOSIS:  Left knee posterior cruciate ligament evulsion  fracture.   PROCEDURE:  Left knee posterior cruciate ligament evulsion fracture,  open reduction and internal fixation via posterior approach.   SURGEON:  Burnard Bunting, M.D.   ASSISTANT:  Wende Neighbors, P.A.   INDICATIONS:  Vergil Burby is a 35 year old patient with left knee PCL  evulsion fracture who presents now for operative management because of  instability.   PROCEDURE IN DETAIL:  The patient is brought to the operating room where  general endotracheal anesthesia was induced.  Preoperative IV  antibiotics were administered.  The patient was placed in the prone  position with the left leg prepped with DuraPrep solution and draped in  a sterile manner.  The topographic anatomy of the knee was identified  including the posterior flexion crease.  The leg was covered with Ioban.  The limb was then elevated and exsanguinated with the Esmarch wrap.  The  tourniquet was inflated.  An S shaped incision was made centered on the  flexion creased.  The subcutaneous tissue was sharply divided.  The  vesting fascia was divided longitudinally.  The lesser saphenous vein  and medial serocutaneous nerves were identified.  Medial and lateral  gastrocnemius heads were split.  Neurovascular bundle was traced from  the lesser saphenous vein and the medial serocutaneous nerve.  These  structures were mobilized laterally using blunt dissection.  Care was  taken to avoid injury to the  gastrocnemius branches.  The knee was  finally flexed to improve visualization.  The posterior capsule was  visualized.  Bruising around the injured area denoted the region of  injury.  Longitudinal incision of the capsule was made.  The bony  fragment with the attached PCL was mobilized.  In general, the PCL  attachment to this bony fragment was intact.  The bony bed was freed of  soft tissue and debris.  The bed was scraped and then the fracture was  secured into a trough using a single 45 bicortical screw.  Correct  placements confirmed the AP and lateral planes under fluoroscopy  visualization.  Good stable reduction was achieved.  It should be noted  that this is difficult dissection in the posterior aspect of the knee  and required significantly more dissection and high level of difficulty  than standard procedures in the posterior aspect of the knee because of  the mobilization of the neurovascular bundle.  The assistance of Maud Deed was required at all times during this case for this mobilization.  Her assistance was required for retraction of important neurovascular  structures.  Following screw fixation,  the tourniquet was released.  Bleeding points encountered controlled using bipolar electrocautery.  The incision was closed using interrupted, inverted 0 Vicryl sutures,  interlocking suture and 3-0 nylon suture.  The patient had good  perfusion and a palpable arterial pulse prior to closure.  The patient  was placed into a posterior splint with the knee flexed 20 degrees.      Burnard Bunting, M.D.  Electronically Signed     GSD/MEDQ  D:  02/10/2007  T:  02/11/2007  Job:  [id number]

## 2010-08-15 ENCOUNTER — Encounter: Payer: Self-pay | Attending: Physical Medicine & Rehabilitation | Admitting: Physical Medicine & Rehabilitation

## 2010-08-15 DIAGNOSIS — M47817 Spondylosis without myelopathy or radiculopathy, lumbosacral region: Secondary | ICD-10-CM | POA: Insufficient documentation

## 2010-08-15 DIAGNOSIS — G5600 Carpal tunnel syndrome, unspecified upper limb: Secondary | ICD-10-CM | POA: Insufficient documentation

## 2010-08-15 DIAGNOSIS — M5380 Other specified dorsopathies, site unspecified: Secondary | ICD-10-CM

## 2010-08-15 DIAGNOSIS — IMO0002 Reserved for concepts with insufficient information to code with codable children: Secondary | ICD-10-CM | POA: Insufficient documentation

## 2010-08-15 DIAGNOSIS — M545 Low back pain, unspecified: Secondary | ICD-10-CM | POA: Insufficient documentation

## 2010-08-15 DIAGNOSIS — E669 Obesity, unspecified: Secondary | ICD-10-CM | POA: Insufficient documentation

## 2010-08-15 DIAGNOSIS — M533 Sacrococcygeal disorders, not elsewhere classified: Secondary | ICD-10-CM

## 2010-08-15 DIAGNOSIS — M79609 Pain in unspecified limb: Secondary | ICD-10-CM | POA: Insufficient documentation

## 2010-08-15 DIAGNOSIS — M5126 Other intervertebral disc displacement, lumbar region: Secondary | ICD-10-CM

## 2010-08-15 DIAGNOSIS — M129 Arthropathy, unspecified: Secondary | ICD-10-CM | POA: Insufficient documentation

## 2010-08-15 NOTE — Assessment & Plan Note (Signed)
Eva is back regarding his chronic lumbar spondylosis and radiculitis. He generally has been doing fairly well.  His pain has been a bit more as he is doing more at work.  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 Pain is in the low back and left posterior leg.  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  REVIEW OF SYSTEMS:  Notable for the above.  He does report some constipation.  Full 12-point review is in the written H and P.  PHYSICAL EXAMINATION:  VITAL SIGNS:  Notable for blood pressure 144/85, pulse is 74, respiratory rate 18 and satting 99% on room air. GENERAL:  The patient is pleasant and alert.  He remains abdominally obese. EXTREMITIES:  He has limitations of movement with extension of the back particularly when he moves to the left side.  He is better on the right side.  He is able to flex still to about 70-75 degrees.  His strength remains 5/5 with normal sensory function and reflexes today.  ASSESSMENT: 1. 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 2. Obesity. 3. History of carpal tunnel syndrome.  PLAN: 1. I have refilled methadone #120, #90 with second prescription for     next month. 2. Continue working on better health hygiene overall including     stretching and diet. 3. Reviewed appropriate dietary supplements and over-the-counter     supplements for constipation as well today. 4. He will see my nurse practitioner back in about 2 months.     Ranelle Oyster, M.D. Electronically Signed    ZTS/MedQ D:  08/15/2010 11:19:31  T:  08/15/2010 13:49:41  Job #:  [id number]

## 2010-10-11 ENCOUNTER — Encounter: Payer: Medicaid Other | Attending: Physical Medicine & Rehabilitation | Admitting: Neurosurgery

## 2010-10-11 DIAGNOSIS — M129 Arthropathy, unspecified: Secondary | ICD-10-CM | POA: Insufficient documentation

## 2010-10-11 DIAGNOSIS — M545 Low back pain, unspecified: Secondary | ICD-10-CM | POA: Insufficient documentation

## 2010-10-11 DIAGNOSIS — M79609 Pain in unspecified limb: Secondary | ICD-10-CM | POA: Insufficient documentation

## 2010-10-11 DIAGNOSIS — IMO0002 Reserved for concepts with insufficient information to code with codable children: Secondary | ICD-10-CM | POA: Insufficient documentation

## 2010-10-11 DIAGNOSIS — G5600 Carpal tunnel syndrome, unspecified upper limb: Secondary | ICD-10-CM | POA: Insufficient documentation

## 2010-10-11 DIAGNOSIS — M47817 Spondylosis without myelopathy or radiculopathy, lumbosacral region: Secondary | ICD-10-CM | POA: Insufficient documentation

## 2010-10-11 DIAGNOSIS — E669 Obesity, unspecified: Secondary | ICD-10-CM | POA: Insufficient documentation

## 2010-10-11 NOTE — Assessment & Plan Note (Signed)
This is a patient of Dr. Rosalyn Charters seen for chronic low back pain.  He is going to be working out of town.  Dr. Riley Kill agreed to give him 2 months' prescriptions.  He did not report any change in his pain level at this point and the medicines well controlled, his pain is 5 or 6.  He does have chronic low back pain.  His activity level is a 4 to a 6. Pain is worse during the day and evening.  Sleep patterns are fair. Walking, sitting, standing, and most activities aggravate; rest, therapy, and medications tend to help.  He walks without assistance.  He can walk about half hour at a time.  He climbs steps and drives.  He works 40 hours a week.  REVIEW OF SYSTEMS:  Notable for those difficulties as well as some constipation, limb swelling, spasms.  Otherwise, no suicidal thoughts or aberrant behaviors.  Oswestry score is 52.  PAST MEDICAL HISTORY:  Unchanged.  SOCIAL HISTORY:  Unchanged.  FAMILY HISTORY:  Unchanged.  PHYSICAL EXAMINATION:  VITAL SIGNS:  His blood pressure is 144/83, pulse 75, respirations 16, O2 sats 99 on room air. CONSTITUTIONAL:  He is within normal limits. NEUROLOGIC:  He is alert and oriented x3, has normal gait.  Sensation and strength are intact in the lower extremities.  No acute changes.  ASSESSMENT: 1. Chronic low back pain. 2. Degenerative disk disease.  PLAN: 1. He was given 2 prescriptions for his Percocet 7.5/325 one p.o. q.6     h. p.r.n., 90 with no refill, one for this month and one for next     month, same with the methadone. 2. 10 mg 2 p.o. b.i.d., 120 with no refill.  He will follow up back     with Dr. Riley Kill in 2 months as directed.     Yoltzin Barg L. Blima Dessert Electronically Signed    RLW/MedQ D:  10/11/2010 09:09:56  T:  10/11/2010 09:52:22  Job #:  [id number]

## 2010-12-17 LAB — URINALYSIS, ROUTINE W REFLEX MICROSCOPIC
Bilirubin Urine: NEGATIVE
Glucose, UA: NEGATIVE
Hgb urine dipstick: NEGATIVE
Ketones, ur: 15 — AB
Leukocytes, UA: NEGATIVE
Nitrite: NEGATIVE
Protein, ur: 30 — AB
Specific Gravity, Urine: 1.027
Urobilinogen, UA: 0.2
pH: 5

## 2010-12-17 LAB — URINE MICROSCOPIC-ADD ON

## 2010-12-17 LAB — URINE CULTURE
Colony Count: NO GROWTH
Culture: NO GROWTH
Special Requests: NEGATIVE

## 2010-12-17 LAB — PROTIME-INR
INR: 1
INR: 1.4
Prothrombin Time: 13.8
Prothrombin Time: 17.5 — ABNORMAL HIGH

## 2010-12-17 LAB — ABO/RH: ABO/RH(D): A POS

## 2010-12-17 LAB — TYPE AND SCREEN
ABO/RH(D): A POS
Antibody Screen: NEGATIVE

## 2010-12-18 LAB — DIFFERENTIAL
Basophils Absolute: 0.1
Basophils Relative: 1
Eosinophils Absolute: 0.2
Eosinophils Relative: 1
Lymphocytes Relative: 31
Lymphs Abs: 4.4 — ABNORMAL HIGH
Monocytes Absolute: 1.2 — ABNORMAL HIGH
Monocytes Relative: 9
Neutro Abs: 8.3 — ABNORMAL HIGH
Neutrophils Relative %: 59

## 2010-12-18 LAB — POCT I-STAT CREATININE
Creatinine, Ser: 0.9
Operator id: 198171

## 2010-12-18 LAB — URINALYSIS, ROUTINE W REFLEX MICROSCOPIC
Glucose, UA: NEGATIVE
Hgb urine dipstick: NEGATIVE
Ketones, ur: NEGATIVE
Nitrite: NEGATIVE
Protein, ur: NEGATIVE
Specific Gravity, Urine: 1.043 — ABNORMAL HIGH
Urobilinogen, UA: 1
pH: 5.5

## 2010-12-18 LAB — CBC
HCT: 41
Hemoglobin: 14.1
MCHC: 34.3
MCV: 87.7
Platelets: 343
RBC: 4.67
RDW: 13.3
WBC: 14.1 — ABNORMAL HIGH

## 2010-12-18 LAB — BASIC METABOLIC PANEL
BUN: 24 — ABNORMAL HIGH
CO2: 28
Calcium: 9.5
Chloride: 102
Creatinine, Ser: 0.78
GFR calc Af Amer: >60
GFR calc non Af Amer: >60
Glucose, Bld: 106 — ABNORMAL HIGH
Potassium: 4
Sodium: 137

## 2010-12-18 LAB — I-STAT 8, (EC8 V) (CONVERTED LAB)
Acid-base deficit: 1
BUN: 21
Bicarbonate: 24.9 — ABNORMAL HIGH
Chloride: 111
Glucose, Bld: 99
HCT: 46
Hemoglobin: 15.6
Operator id: 198171
Potassium: 4.3
Sodium: 143
TCO2: 26
pCO2, Ven: 44.1 — ABNORMAL LOW
pH, Ven: 7.36 — ABNORMAL HIGH

## 2010-12-18 LAB — PROTIME-INR
INR: 0.9
Prothrombin Time: 12.3

## 2010-12-18 LAB — APTT: aPTT: 34

## 2010-12-28 ENCOUNTER — Encounter: Payer: Self-pay | Attending: Neurosurgery | Admitting: Neurosurgery

## 2010-12-28 ENCOUNTER — Ambulatory Visit: Payer: Medicaid Other | Admitting: Physical Medicine & Rehabilitation

## 2010-12-28 DIAGNOSIS — M545 Low back pain, unspecified: Secondary | ICD-10-CM | POA: Insufficient documentation

## 2010-12-28 DIAGNOSIS — M5137 Other intervertebral disc degeneration, lumbosacral region: Secondary | ICD-10-CM | POA: Insufficient documentation

## 2010-12-28 DIAGNOSIS — M51379 Other intervertebral disc degeneration, lumbosacral region without mention of lumbar back pain or lower extremity pain: Secondary | ICD-10-CM | POA: Insufficient documentation

## 2010-12-28 DIAGNOSIS — G8929 Other chronic pain: Secondary | ICD-10-CM | POA: Insufficient documentation

## 2010-12-28 DIAGNOSIS — G894 Chronic pain syndrome: Secondary | ICD-10-CM

## 2010-12-29 NOTE — Assessment & Plan Note (Signed)
This is a patient of Dr. Riley Kill seen for chronic low back pain.  He is still driving a truck.  The patient is asked for a note from [place] stating it is okay for him to take his narcotics and operate a commercial vehicle, which I declined to do.  I have explained to him that it was just not a legal thing that we could issue.  He reports his pain is unchanged at 4-5.  It is sharp to aching type pain.  General activity level 4.  Pain is worse during the day and the evening.  Pain is worse with all activity.  Rest and medication tend to help.  He walks without assistance.  He can walk up to 45 minutes at a time.  He climbs steps and drives.  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  REVIEW OF SYSTEMS:  Notable for difficulty described above, otherwise within normal limits.  Past medical history, social history, and family history unchanged.  PHYSICAL EXAMINATION:  Blood pressure 157/83, pulse 78, respirations 18, O2 sats 98 on room air.  His motor strength and sensation is intact. Constitutionally, he is obese.  He is alert and oriented times 3, has a normal gait.  ASSESSMENT: 1. Chronic low back pain. 2. Degenerative disk disease.  PLAN:  He is given two prescriptions, one for this month, one for next month for methadone 10 mg 2 p.o. b.i.d., 20 with no refill.  The other was Percocet 7.5/325 1 p.o. q.6 h. p.r.n. 90 with no refill.  His questions were encouraged and answered.  Again, I declined to give him a note so that he could operate a commercial vehicle taking narcotics. His Oswestry score today is 50.     Fatoumata Albaugh L. Blima Dessert Electronically Signed    RLW/MedQ D:  12/28/2010 14:23:53  T:  12/29/2010 01:40:53  Job #:  [id number]

## 2011-02-18 ENCOUNTER — Encounter: Payer: Self-pay | Attending: Neurosurgery | Admitting: Neurosurgery

## 2011-02-18 DIAGNOSIS — M545 Low back pain, unspecified: Secondary | ICD-10-CM

## 2011-02-18 DIAGNOSIS — G8929 Other chronic pain: Secondary | ICD-10-CM | POA: Insufficient documentation

## 2011-02-18 DIAGNOSIS — M51379 Other intervertebral disc degeneration, lumbosacral region without mention of lumbar back pain or lower extremity pain: Secondary | ICD-10-CM | POA: Insufficient documentation

## 2011-02-18 DIAGNOSIS — G894 Chronic pain syndrome: Secondary | ICD-10-CM

## 2011-02-18 DIAGNOSIS — M5137 Other intervertebral disc degeneration, lumbosacral region: Secondary | ICD-10-CM | POA: Insufficient documentation

## 2011-02-19 NOTE — Assessment & Plan Note (Signed)
This is a patient of Dr. Riley Kill seen for chronic low back pain.  He is still working full time as a Naval architect.  He rates his average pain unchanged at 3-5 to sharp, stabbing and aching type pain.  General activity level is 2-3.  Pain is worse in the morning and evening.  Sleep patterns are fair.  Walking, sitting, standing and activity aggravate; rest, heat, therapy and medication tend to help.  He walks without assistance.  He can walk about an hour and a half.  He climbs steps and drives.  Functionally again, he is a full-time Naval architect.  REVIEW OF SYSTEMS:  Notable for difficulties described above, otherwise within normal limits.  PAST MEDICAL HISTORY, SOCIAL HISTORY AND FAMILY HISTORY:  Unchanged.  PHYSICAL EXAMINATION:  VITAL SIGNS:  His blood pressure is 147/74, pulse 65, respirations 16 and O2 sats 97 on room air.  EXTREMITIES:  His motor strength and sensation are intact. NEUROLOGICAL:  Constitutionally, he is obese.  He is alert and oriented x3.  He has normal gait.  ASSESSMENT: 1. Chronic low back pain 2. Degenerative disk disease lumbar spine.  PLAN: 1. He is given 2 prescriptions for each of these medicines 1 for this     month and 1 for next month.  He will follow up here in 2 months.     First the methadone 10 mg 2 b.i.d. 120 with no refill 2. Percocet 7.5/325 one p.o. q.6 h. p.r.n. 90 with no refill.  His     questions were encouraged and answered.  We will see him in 2     months.     Oseias Horsey L. Blima Dessert Electronically Signed    RLW/MedQ D:  02/18/2011 15:28:40  T:  02/19/2011 03:05:30  Job #:  [id number]

## 2011-02-27 ENCOUNTER — Ambulatory Visit: Payer: Self-pay | Admitting: Physical Medicine & Rehabilitation

## 2011-04-22 ENCOUNTER — Encounter: Payer: Self-pay | Admitting: Physical Medicine & Rehabilitation

## 2011-04-22 ENCOUNTER — Encounter: Payer: Self-pay | Attending: Neurosurgery

## 2011-04-22 DIAGNOSIS — M545 Low back pain, unspecified: Secondary | ICD-10-CM | POA: Insufficient documentation

## 2011-04-22 DIAGNOSIS — M51379 Other intervertebral disc degeneration, lumbosacral region without mention of lumbar back pain or lower extremity pain: Secondary | ICD-10-CM | POA: Insufficient documentation

## 2011-04-22 DIAGNOSIS — G8929 Other chronic pain: Secondary | ICD-10-CM | POA: Insufficient documentation

## 2011-04-22 DIAGNOSIS — IMO0002 Reserved for concepts with insufficient information to code with codable children: Secondary | ICD-10-CM

## 2011-04-22 DIAGNOSIS — M5380 Other specified dorsopathies, site unspecified: Secondary | ICD-10-CM

## 2011-04-22 DIAGNOSIS — M5137 Other intervertebral disc degeneration, lumbosacral region: Secondary | ICD-10-CM | POA: Insufficient documentation

## 2011-04-22 DIAGNOSIS — M533 Sacrococcygeal disorders, not elsewhere classified: Secondary | ICD-10-CM

## 2011-05-02 ENCOUNTER — Telehealth: Payer: Self-pay | Admitting: Physical Medicine & Rehabilitation

## 2011-05-02 NOTE — Telephone Encounter (Signed)
Pt states they are no longer making Percocet 7.5/325.  Called 5 different pharmacies, they all said they are no longer making this.  Please advise.

## 2011-05-03 MED ORDER — OXYCODONE-ACETAMINOPHEN 7.5-500 MG PO TABS: 1.0000 | ORAL_TABLET | Freq: Three times a day (TID) | ORAL | Status: DC | PRN

## 2011-05-03 NOTE — Telephone Encounter (Signed)
Pt aware that will increase his med to Percocet 7.5/500. I advised him to call around and see if any pharmacy has this dose. He will call me back and let me know so I can get the rx ready for him.

## 2011-05-03 NOTE — Telephone Encounter (Signed)
How about 7.5/500?  i still see the 325 as being available FYI

## 2011-05-03 NOTE — Telephone Encounter (Signed)
LM for pt to call back.

## 2011-05-03 NOTE — Telephone Encounter (Signed)
ok 

## 2011-05-03 NOTE — Telephone Encounter (Signed)
Pt called back and he has found a pharmacy with this new dose. Aware rx is ready for him to pick up.

## 2011-05-03 NOTE — Telephone Encounter (Signed)
Do you have any recommendations for this patient? 

## 2011-06-17 ENCOUNTER — Encounter: Payer: Self-pay | Admitting: Physical Medicine & Rehabilitation

## 2011-06-17 ENCOUNTER — Encounter: Payer: Self-pay | Attending: Neurosurgery | Admitting: Physical Medicine & Rehabilitation

## 2011-06-17 VITALS — BP 136/79 | HR 69 | Resp 16 | Ht 68.0 in | Wt 264.0 lb

## 2011-06-17 DIAGNOSIS — M545 Low back pain, unspecified: Secondary | ICD-10-CM | POA: Insufficient documentation

## 2011-06-17 DIAGNOSIS — M5116 Intervertebral disc disorders with radiculopathy, lumbar region: Secondary | ICD-10-CM

## 2011-06-17 DIAGNOSIS — M5126 Other intervertebral disc displacement, lumbar region: Secondary | ICD-10-CM | POA: Insufficient documentation

## 2011-06-17 MED ORDER — METHADONE HCL 10 MG PO TABS: 20.0000 mg | ORAL_TABLET | Freq: Two times a day (BID) | ORAL | Status: DC

## 2011-06-17 MED ORDER — OXYCODONE-ACETAMINOPHEN 7.5-500 MG PO TABS: 1.0000 | ORAL_TABLET | Freq: Three times a day (TID) | ORAL | Status: DC | PRN

## 2011-06-17 NOTE — Patient Instructions (Signed)
Continue good posture and stretching exercises

## 2011-06-17 NOTE — Progress Notes (Signed)
  Subjective:    Patient ID: Joshua [name], male    DOB: 06-Feb-1976, 36 y.o.   MRN: [id number]  HPI  Joshua [name] is back regarding his chronic pain.  Things have remained fairly steady. 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 Work has been pretty steady. 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 He's been getting positive feedback from his bosses.  He tolerates th work load without a problem.   Bowels and bladder steady.  Weight has been about the same.    Pain Inventory Average Pain 5 Pain Right Now 5 My pain is constant, sharp and aching  In the last 24 hours, has pain interfered with the following? General activity 4 Relation with others 4 Enjoyment of life 4 What TIME of day is your pain at its worst? Daytime, Evening and Night Sleep (in general) Fair  Pain is worse with: walking, sitting, standing and some activites Pain improves with: rest, therapy/exercise and medication Relief from Meds: 5  Mobility walk without assistance how many minutes can you walk? 30 - 60 minutes Do you have any goals in this area?  no  Function employed # of hrs/week 20-40 hours Do you have any goals in this area?  no  Neuro/Psych spasms  Prior Studies Any changes since last visit?  no  Physicians involved in your care Any changes since last visit?  no  Review of Systems  Constitutional: Negative.   HENT: Negative.   Eyes: Negative.   Respiratory: Negative.   Cardiovascular: Negative.   Gastrointestinal: Negative.   Genitourinary: Negative.   Musculoskeletal: Negative.   Skin: Negative.   Neurological: Negative.   Hematological: Negative.   Psychiatric/Behavioral: Negative.        Objective:   Physical Exam  Constitutional: He is oriented to person, place, and time. He appears well-developed and well-nourished.  HENT:  Head: Normocephalic and atraumatic.  Eyes: Conjunctivae and EOM are normal. Pupils are equal, round, and reactive to light.  Neck:  Normal range of motion. Neck supple.  Cardiovascular: Normal rate and regular rhythm.   Pulmonary/Chest: Effort normal and breath sounds normal.  Abdominal: Soft. Bowel sounds are normal.  Musculoskeletal:       Lumbar back: He exhibits decreased range of motion and tenderness. He exhibits no edema and no deformity.       Remains limited somewhat with forward lumbar flexion due to pain. Weight is about the same. cogntively he's alert an appropriate.  Neurological: He is alert and oriented to person, place, and time.          Assessment & Plan:  ASSESSMENT:  1. Chronic low back pain  2. Degenerative disk disease lumbar spine.  PLAN:  1. He is given 2 prescriptions for each of these medicines 1 for this  month and 1 for next month. He will follow up here in 2 months.  First the methadone 10 mg 2 b.i.d. 120 with no refill. He continues to be compliant with our protocol. 2. Percocet 7.5/325 one p.o. q.6 h. p.r.n. 90 with no refill. His  questions were encouraged and answered.  3. We will see him in 2  months

## 2011-06-18 ENCOUNTER — Encounter: Payer: Self-pay | Admitting: Physical Medicine & Rehabilitation

## 2011-07-31 ENCOUNTER — Telehealth: Payer: Self-pay

## 2011-07-31 NOTE — Telephone Encounter (Signed)
Confirmed with Janelle Floor that Torrie was given 2 RX for each medication because he is allowed to come every 2 months rather than monthly.  They did not have the second set of RX's.  Janelle Floor said that Kinta called them back and he only gave them the first set and is holding the second set for the following month.

## 2011-07-31 NOTE — Telephone Encounter (Signed)
Pt told pharmacy he had a second script for methadone and oxycodone.  Pharmacy would like a callback for claification.

## 2011-08-16 ENCOUNTER — Encounter: Payer: Self-pay | Admitting: Physical Medicine and Rehabilitation

## 2011-08-16 ENCOUNTER — Encounter: Payer: Self-pay | Attending: Physical Medicine & Rehabilitation | Admitting: Physical Medicine and Rehabilitation

## 2011-08-16 VITALS — BP 166/83 | HR 75 | Ht 68.0 in | Wt 260.0 lb

## 2011-08-16 DIAGNOSIS — M545 Low back pain, unspecified: Secondary | ICD-10-CM | POA: Insufficient documentation

## 2011-08-16 DIAGNOSIS — G8929 Other chronic pain: Secondary | ICD-10-CM | POA: Insufficient documentation

## 2011-08-16 DIAGNOSIS — M79609 Pain in unspecified limb: Secondary | ICD-10-CM | POA: Insufficient documentation

## 2011-08-16 DIAGNOSIS — M79605 Pain in left leg: Secondary | ICD-10-CM

## 2011-08-16 DIAGNOSIS — M51379 Other intervertebral disc degeneration, lumbosacral region without mention of lumbar back pain or lower extremity pain: Secondary | ICD-10-CM | POA: Insufficient documentation

## 2011-08-16 DIAGNOSIS — M5137 Other intervertebral disc degeneration, lumbosacral region: Secondary | ICD-10-CM | POA: Insufficient documentation

## 2011-08-16 DIAGNOSIS — M5126 Other intervertebral disc displacement, lumbar region: Secondary | ICD-10-CM

## 2011-08-16 DIAGNOSIS — M5116 Intervertebral disc disorders with radiculopathy, lumbar region: Secondary | ICD-10-CM

## 2011-08-16 MED ORDER — OXYCODONE-ACETAMINOPHEN 7.5-500 MG PO TABS: 1.0000 | ORAL_TABLET | Freq: Three times a day (TID) | ORAL | Status: DC | PRN

## 2011-08-16 MED ORDER — METHADONE HCL 10 MG PO TABS: 20.0000 mg | ORAL_TABLET | Freq: Two times a day (BID) | ORAL | Status: DC

## 2011-08-16 NOTE — Progress Notes (Signed)
Subjective:    Patient ID: Joshua [name], male    DOB: Jun 22, 1975, 36 y.o.   MRN: [id number]  HPI The patient complains about chronic low back pain which radiates into his posterior left LE.  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   Pain Inventory Average Pain 6 Pain Right Now 6 My pain is intermittent, burning and stabbing  In the last 24 hours, has pain interfered with the following? General activity 4 Relation with others 4 Enjoyment of life 5 What TIME of day is your pain at its worst? morning and evening Sleep (in general) Good  Pain is worse with: walking, bending, sitting and standing Pain improves with: rest, therapy/exercise and medication Relief from Meds: 5  Mobility walk without assistance how many minutes can you walk? 30 ability to climb steps?  yes do you drive?  yes Do you have any goals in this area?  no  Function employed # of hrs/week 40 Do you have any goals in this area?  no  Neuro/Psych No problems in this area  Prior Studies Any changes since last visit?  no  Physicians involved in your care Any changes since last visit?  no   Family History  Problem Relation Age of Onset  . Hypertension Mother   . Diabetes Father    History   Social History  . Marital Status: Single    Spouse Name: N/A    Number of Children: N/A  . Years of Education: N/A   Social History Main Topics  . Smoking status: Current Everyday Smoker -- 1.0 packs/day  . Smokeless tobacco: None  . Alcohol Use:   . Drug Use:   . Sexually Active:    Other Topics Concern  . None   Social History Narrative  . None   Past Surgical History  Procedure Date  . Knee surgery    Past Medical History  Diagnosis Date  . Disorders of sacrum   . Displacement of lumbar intervertebral disc without myelopathy   .  Thoracic or lumbosacral neuritis or radiculitis, unspecified   . Facet syndrome, lumbar   . Lumbago    BP 166/83  Pulse 75  Ht 5\' 8"  (1.727 m)  Wt 260 lb (117.935 kg)  BMI 39.53 kg/m2  SpO2 97%     Review of Systems  All other systems reviewed and are negative.       Objective:   Physical Exam  Constitutional: He is oriented to person, place, and time. He appears well-developed and well-nourished.  HENT:  Head: Normocephalic.  Neck: Neck supple.  Musculoskeletal: He exhibits tenderness.  Neurological: He is alert and oriented to person, place, and time.  Skin: Skin is warm and dry.  Psychiatric: He has a normal mood and affect.     Symmetric normal motor tone is noted throughout. Normal muscle bulk. Muscle testing reveals 5/5 muscle strength of the upper extremity, and 5/5 of the lower extremity , except left gastrocnemius 4/5. Full range of motion in upper and lower extremities. ROM of spine is  restricted.  DTR in the upper and lower extremity are present and symmetric 2+. No clonus is noted.  Patient arises from chair without difficulty. Narrow based gait with normal arm swing bilateral , able to stand on heels and toes . Tandem walk  is stable.       Assessment & Plan:  This is a 36 year old  male with 1. Back pain radiating into his left leg 2. lumbar degenerative disc disease  Plan : Continue with medication, continue with core exercises. Advised patient to work out in the pool of his boss. 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 Refilled his pain medication for 2 months, because he is out of town a lot. Follow up in 2 month.

## 2011-08-16 NOTE — Patient Instructions (Signed)
Continue with medication, continue with walking and exercising. Advised patient to work out in his boss's pool. Patient could wear a brace when driving for longer distances.

## 2011-08-16 NOTE — Progress Notes (Signed)
Addended by: Judd Gaudier on: 08/16/2011 01:07 PM   Modules accepted: Orders

## 2011-10-29 ENCOUNTER — Encounter: Payer: Self-pay | Attending: Physical Medicine & Rehabilitation | Admitting: Physical Medicine & Rehabilitation

## 2011-10-29 ENCOUNTER — Encounter: Payer: Self-pay | Admitting: Physical Medicine & Rehabilitation

## 2011-10-29 VITALS — BP 146/71 | HR 77 | Resp 14 | Ht 68.0 in | Wt 258.8 lb

## 2011-10-29 DIAGNOSIS — M545 Low back pain, unspecified: Secondary | ICD-10-CM | POA: Insufficient documentation

## 2011-10-29 DIAGNOSIS — M5126 Other intervertebral disc displacement, lumbar region: Secondary | ICD-10-CM | POA: Insufficient documentation

## 2011-10-29 DIAGNOSIS — M5116 Intervertebral disc disorders with radiculopathy, lumbar region: Secondary | ICD-10-CM

## 2011-10-29 MED ORDER — OXYCODONE-ACETAMINOPHEN 7.5-500 MG PO TABS: 1.0000 | ORAL_TABLET | Freq: Three times a day (TID) | ORAL | Status: DC | PRN

## 2011-10-29 MED ORDER — METHADONE HCL 10 MG PO TABS: 20.0000 mg | ORAL_TABLET | Freq: Two times a day (BID) | ORAL | Status: DC

## 2011-10-29 NOTE — Progress Notes (Signed)
Subjective:    Patient ID: Joshua [name], male    DOB: 03-Oct-1975, 36 y.o.   MRN: [id number]  HPI  Joshua [name] is back regarding his chronic back pain. He's down in [place], [place] currently working. He has been down there for about a month on a job which will last 3 years. He is exercising via walking, stretching at home. His weight is steady.    Pain Inventory Average Pain 5 Pain Right Now 5 My pain is constant, burning, stabbing and aching  In the last 24 hours, has pain interfered with the following? General activity 5 Relation with others 5 Enjoyment of life 5 What TIME of day is your pain at its worst? morning daytime and evening Sleep (in general) Fair  Pain is worse with: walking, bending, sitting, standing and some activites Pain improves with: rest, therapy/exercise and medication Relief from Meds: 5  Mobility walk without assistance how many minutes can you walk? 30 ability to climb steps?  yes do you drive?  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  no  Physicians involved in your care Any changes since last visit?  no   Family History  Problem Relation Age of Onset  . Hypertension Mother   . Diabetes Father    History   Social History  . Marital Status: Single    Spouse Name: N/A    Number of Children: N/A  . Years of Education: N/A   Social History Main Topics  . Smoking status: Current Everyday Smoker -- 1.0 packs/day  . Smokeless tobacco: Never Used  . Alcohol Use: None  . Drug Use: None  . Sexually Active: None   Other Topics Concern  . None   Social History Narrative  . None   Past Surgical History  Procedure Date  . Knee surgery    Past Medical History  Diagnosis Date  . Disorders of sacrum   . Displacement of lumbar intervertebral disc without myelopathy   . Thoracic or lumbosacral neuritis or radiculitis,  unspecified   . Facet syndrome, lumbar   . Lumbago    BP 146/71  Pulse 77  Resp 14  Ht 5\' 8"  (1.727 m)  Wt 258 lb 12.8 oz (117.391 kg)  BMI 39.35 kg/m2  SpO2 98%    Review of Systems  Musculoskeletal: Positive for back pain.  All other systems reviewed and are negative.       Objective:   Physical Exam Constitutional: He is oriented to person, place, and time. He appears well-developed and well-nourished.  HENT:  Head: Normocephalic and atraumatic.  Eyes: Conjunctivae and EOM are normal. Pupils are equal, round, and reactive to light.  Neck: Normal range of motion. Neck supple.  Cardiovascular: Normal rate and regular rhythm.  Pulmonary/Chest: Effort normal and breath sounds normal.  Abdominal: Soft. Bowel sounds are normal.  Musculoskeletal:  Lumbar back: He exhibits decreased range of motion and tenderness. He exhibits no edema and no deformity.  Remains limited somewhat with forward lumbar flexion and extension due to pain. I felt he had more pain with extension and facet tests today. Weight is unchanged. cogntively he's alert an appropriate.  Neurological: He is alert and oriented to person, place, and time.    Assessment & Plan:   ASSESSMENT:  1. Chronic low back pain  2. Degenerative disk disease lumbar spine.   PLAN:  1.  He is given 2 prescriptions for each of these medicines 1 for this  month and 1 for next month. He will follow up here in 2 months.  First the methadone 10 mg 2 b.i.d. 120 with no refill. He continues to be compliant with our protocol.  2. Percocet 7.5/325 one p.o. q.6 h. p.r.n. 90 with no refill. His  questions were encouraged and answered.  3. My PA will see him in 2  Months

## 2011-10-29 NOTE — Patient Instructions (Signed)
CONTINUE YOUR EXERCISES AS MUCH AS YOU CAN.

## 2011-12-27 ENCOUNTER — Encounter: Payer: Self-pay | Attending: Physical Medicine & Rehabilitation | Admitting: Physical Medicine & Rehabilitation

## 2011-12-27 ENCOUNTER — Encounter: Payer: Self-pay | Admitting: Physical Medicine & Rehabilitation

## 2011-12-27 VITALS — BP 137/59 | HR 69 | Resp 14 | Ht 68.0 in | Wt 259.4 lb

## 2011-12-27 DIAGNOSIS — M5116 Intervertebral disc disorders with radiculopathy, lumbar region: Secondary | ICD-10-CM

## 2011-12-27 DIAGNOSIS — M545 Low back pain, unspecified: Secondary | ICD-10-CM | POA: Insufficient documentation

## 2011-12-27 DIAGNOSIS — M5126 Other intervertebral disc displacement, lumbar region: Secondary | ICD-10-CM | POA: Insufficient documentation

## 2011-12-27 MED ORDER — OXYCODONE-ACETAMINOPHEN 7.5-500 MG PO TABS: 1.0000 | ORAL_TABLET | Freq: Three times a day (TID) | ORAL | Status: DC | PRN

## 2011-12-27 MED ORDER — METHADONE HCL 10 MG PO TABS: 20.0000 mg | ORAL_TABLET | Freq: Two times a day (BID) | ORAL | Status: DC

## 2011-12-27 NOTE — Progress Notes (Signed)
Subjective:    Patient ID: Joshua [name], male    DOB: 03-18-1975, 36 y.o.   MRN: [id number]  HPI  Joshua [name] is back regarding his chronic pain syndrome. He states that pain has been fairly steady. His meds are working well. He continues to truck drive full time. He is currently working in [place], [place]. He tries to keep with general walking, core muscle exercises, etc. His weight is unchanged. Bowel and bladder function has been normal.   Pain Inventory Average Pain 5 Pain Right Now 5 My pain is constant, sharp, stabbing and aching  In the last 24 hours, has pain interfered with the following? General activity 4 Relation with others 4 Enjoyment of life 4 What TIME of day is your pain at its worst? morning and daytime Sleep (in general) Fair  Pain is worse with: walking, bending, sitting, standing and some activites Pain improves with: rest, therapy/exercise and medication Relief from Meds: 5  Mobility walk without assistance how many minutes can you walk? 30 ability to climb steps?  yes do you drive?  yes  Function employed # of hrs/week 30  Neuro/Psych No problems in this area  Prior Studies Any changes since last visit?  no  Physicians involved in your care Any changes since last visit?  no   Family History  Problem Relation Age of Onset  . Hypertension Mother   . Diabetes Father    History   Social History  . Marital Status: Single    Spouse Name: N/A    Number of Children: N/A  . Years of Education: N/A   Social History Main Topics  . Smoking status: Current Every Day Smoker -- 1.0 packs/day  . Smokeless tobacco: Never Used  . Alcohol Use: None  . Drug Use: None  . Sexually Active: None   Other Topics Concern  . None   Social History Narrative  . None   Past Surgical History  Procedure Date  . Knee surgery    Past Medical History  Diagnosis Date  . Disorders of sacrum   . Displacement of lumbar intervertebral disc without myelopathy     . Thoracic or lumbosacral neuritis or radiculitis, unspecified   . Facet syndrome, lumbar   . Lumbago    BP 137/59  Pulse 69  Resp 14  Ht 5\' 8"  (1.727 m)  Wt 259 lb 6.4 oz (117.663 kg)  BMI 39.44 kg/m2  SpO2 96%    Review of Systems  Musculoskeletal: Positive for back pain.  All other systems reviewed and are negative.       Objective:   Physical Exam  Constitutional: He is oriented to person, place, and time. He appears well-developed and well-nourished.  HENT:  Head: Normocephalic and atraumatic.  Eyes: Conjunctivae and EOM are normal. Pupils are equal, round, and reactive to light.  Neck: Normal range of motion. Neck supple.  Cardiovascular: Normal rate and regular rhythm.  Pulmonary/Chest: Effort normal and breath sounds normal.  Abdominal: Soft. Bowel sounds are normal.  Musculoskeletal:  Lumbar back: He exhibits decreased range of motion and tenderness. He exhibits no edema and no deformity.  Remains limited somewhat with forward lumbar flexion and extension due to pain. I felt he had more pain with extension and facet tests today. Weight is unchanged. cogntively he's alert an appropriate.  Neurological: He is alert and oriented to person, place, and time.  Assessment & Plan:   ASSESSMENT:  1. Chronic low back pain  2. Degenerative disk disease lumbar  spine.   PLAN:  1. He is given 2 prescriptions for each of these medicines 1 for this  month and 1 for next month. He will follow up here in 2 months.  First the methadone 10 mg 2 b.i.d. 120 with no refill. He continues to be compliant with our protocol.  2. Percocet 7.5/325 one p.o. q.6 h. p.r.n. 90 with no refill. His  questions were encouraged and answered.  3. My PA will see him in about 2  Months. 15 minutes of face to face patient care time were spent during this visit. All questions were encouraged and answered.

## 2011-12-27 NOTE — Patient Instructions (Signed)
Call with questions.

## 2012-01-27 ENCOUNTER — Telehealth: Payer: Self-pay | Admitting: *Deleted

## 2012-01-27 MED ORDER — OXYCODONE-ACETAMINOPHEN 7.5-325 MG PO TABS: 1.0000 | ORAL_TABLET | Freq: Three times a day (TID) | ORAL | Status: DC | PRN

## 2012-01-27 NOTE — Telephone Encounter (Signed)
There is a Chartered loss adjuster of the 7.5/500 percocet. His pharmacy only has 72 and they don't know when they will get more.  He is requesting to get a new rx for the 7.5/325 and take add tylenol if needed. They will bring the old rx back.  New rx printed

## 2012-01-27 NOTE — Telephone Encounter (Signed)
Previous rx for 7.5/500 returned and destroyed when new rx for 7.5/325 picked up by fiance Charleston Ropes.

## 2012-02-21 ENCOUNTER — Encounter: Payer: Self-pay | Admitting: Physical Medicine and Rehabilitation

## 2012-02-21 ENCOUNTER — Encounter: Payer: Self-pay | Attending: Physical Medicine and Rehabilitation | Admitting: Physical Medicine and Rehabilitation

## 2012-02-21 VITALS — BP 129/66 | HR 70 | Resp 14 | Ht 68.0 in | Wt 259.0 lb

## 2012-02-21 DIAGNOSIS — G8929 Other chronic pain: Secondary | ICD-10-CM | POA: Insufficient documentation

## 2012-02-21 DIAGNOSIS — Z5181 Encounter for therapeutic drug level monitoring: Secondary | ICD-10-CM

## 2012-02-21 DIAGNOSIS — M545 Low back pain, unspecified: Secondary | ICD-10-CM

## 2012-02-21 DIAGNOSIS — M5136 Other intervertebral disc degeneration, lumbar region: Secondary | ICD-10-CM

## 2012-02-21 DIAGNOSIS — M549 Dorsalgia, unspecified: Secondary | ICD-10-CM

## 2012-02-21 DIAGNOSIS — M5126 Other intervertebral disc displacement, lumbar region: Secondary | ICD-10-CM

## 2012-02-21 DIAGNOSIS — M51379 Other intervertebral disc degeneration, lumbosacral region without mention of lumbar back pain or lower extremity pain: Secondary | ICD-10-CM

## 2012-02-21 DIAGNOSIS — M79609 Pain in unspecified limb: Secondary | ICD-10-CM | POA: Insufficient documentation

## 2012-02-21 DIAGNOSIS — M5137 Other intervertebral disc degeneration, lumbosacral region: Secondary | ICD-10-CM

## 2012-02-21 DIAGNOSIS — M5116 Intervertebral disc disorders with radiculopathy, lumbar region: Secondary | ICD-10-CM

## 2012-02-21 MED ORDER — METHADONE HCL 10 MG PO TABS: 20.0000 mg | ORAL_TABLET | Freq: Two times a day (BID) | ORAL | Status: DC

## 2012-02-21 MED ORDER — OXYCODONE-ACETAMINOPHEN 7.5-325 MG PO TABS: 1.0000 | ORAL_TABLET | Freq: Three times a day (TID) | ORAL | Status: DC | PRN

## 2012-02-21 MED ORDER — OXYCODONE-ACETAMINOPHEN 7.5-325 MG PO TABS
1.0000 | ORAL_TABLET | Freq: Three times a day (TID) | ORAL | Status: DC | PRN
Start: 2012-02-21 — End: 2012-04-29

## 2012-02-21 NOTE — Patient Instructions (Signed)
Continue with doing your exercises.

## 2012-02-21 NOTE — Progress Notes (Signed)
Subjective:    Patient ID: Joshua [name], male    DOB: 08-Oct-1975, 36 y.o.   MRN: [id number]  HPI The patient complains about chronic low back pain which radiates into his posterior left LE.  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   Pain Inventory Average Pain 5 Pain Right Now 6 My pain is sharp, burning, stabbing and aching  In the last 24 hours, has pain interfered with the following? General activity 5 Relation with others 4 Enjoyment of life 6 What TIME of day is your pain at its worst? day, evening and night Sleep (in general) Fair  Pain is worse with: walking, bending, sitting, standing and some activites Pain improves with: rest, therapy/exercise and medication Relief from Meds: 6  Mobility walk without assistance how many minutes can you walk? 30 ability to climb steps?  yes do you drive?  yes Do you have any goals in this area?  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  no  Neuro/Psych No problems in this area  Prior Studies Any changes since last visit?  no  Physicians involved in your care Any changes since last visit?  no   Family History  Problem Relation Age of Onset  . Hypertension Mother   . Diabetes Father    History   Social History  . Marital Status: Single    Spouse Name: N/A    Number of Children: N/A  . Years of Education: N/A   Social History Main Topics  . Smoking status: Current Every Day Smoker -- 1.0 packs/day  . Smokeless tobacco: Never Used  . Alcohol Use: None  . Drug Use: None  . Sexually Active: None   Other Topics Concern  . None   Social History Narrative  . None   Past Surgical History  Procedure Date  . Knee surgery    Past Medical History  Diagnosis Date  . Disorders of sacrum   . Displacement of  lumbar intervertebral disc without myelopathy   . Thoracic or lumbosacral neuritis or radiculitis, unspecified   . Facet syndrome, lumbar   . Lumbago    BP 129/66  Pulse 70  Resp 14  Ht 5\' 8"  (1.727 m)  Wt 259 lb (117.482 kg)  BMI 39.38 kg/m2  SpO2 99%     Review of Systems  Musculoskeletal: Positive for back pain.  All other systems reviewed and are negative.       Objective:   Physical Exam Constitutional: He is oriented to person, place, and time. He appears well-developed and well-nourished.  HENT:  Head: Normocephalic.  Neck: Neck supple.  Musculoskeletal: He exhibits tenderness.  Neurological: He is alert and oriented to person, place, and time.  Skin: Skin is warm and dry.  Psychiatric: He has a normal mood and affect.   Symmetric normal motor tone is noted throughout. Normal muscle bulk. Muscle testing reveals 5/5 muscle strength of the upper extremity, and 5/5 of the lower extremity , except left gastrocnemius 4/5. Full range of motion in upper and lower extremities. ROM of spine is restricted.  DTR in the upper and lower extremity are present and symmetric 2+. No clonus is noted.  Patient arises from chair without difficulty. Narrow based gait with normal arm  swing bilateral , able to stand on heels and toes . Tandem walk is stable.         Assessment & Plan:  This is a 36 year old male with  1. Back pain radiating into his left leg in S1 distribution  2. lumbar degenerative disc disease  Plan :  Continue with medication, continue with core exercises. Advised patient to work out in the pool of his boss. 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 Refilled his pain medication for 2 months, because he is out of town a lot.  Follow up in 2 month.

## 2012-02-24 ENCOUNTER — Telehealth: Payer: Self-pay

## 2012-02-24 NOTE — Telephone Encounter (Signed)
Why does he need this now?

## 2012-02-24 NOTE — Telephone Encounter (Signed)
Letter faxed.

## 2012-02-24 NOTE — Telephone Encounter (Signed)
He needs a new letter for his job because of insurance.

## 2012-02-24 NOTE — Telephone Encounter (Signed)
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  Please advise.  Fax letter to [phone]

## 2012-04-22 ENCOUNTER — Telehealth: Payer: Self-pay

## 2012-04-22 NOTE — Telephone Encounter (Signed)
Patient called to make sure we were going to be open on the day of his appointment due to weather.  Advised him we plan to open.  He said he will keep appointment.

## 2012-04-24 ENCOUNTER — Encounter: Payer: Self-pay | Admitting: Physical Medicine and Rehabilitation

## 2012-04-27 ENCOUNTER — Telehealth: Payer: Self-pay

## 2012-04-27 NOTE — Telephone Encounter (Signed)
Patient called to say he will be out of methadone before his appointment.  He will make an earlier appointment.

## 2012-04-29 ENCOUNTER — Encounter: Payer: Self-pay | Admitting: Physical Medicine and Rehabilitation

## 2012-04-29 ENCOUNTER — Encounter: Payer: Self-pay | Attending: Physical Medicine and Rehabilitation | Admitting: Physical Medicine and Rehabilitation

## 2012-04-29 VITALS — BP 131/64 | HR 73 | Resp 14 | Ht 68.0 in | Wt 258.0 lb

## 2012-04-29 DIAGNOSIS — M51379 Other intervertebral disc degeneration, lumbosacral region without mention of lumbar back pain or lower extremity pain: Secondary | ICD-10-CM | POA: Insufficient documentation

## 2012-04-29 DIAGNOSIS — M545 Low back pain, unspecified: Secondary | ICD-10-CM | POA: Insufficient documentation

## 2012-04-29 DIAGNOSIS — M533 Sacrococcygeal disorders, not elsewhere classified: Secondary | ICD-10-CM | POA: Insufficient documentation

## 2012-04-29 DIAGNOSIS — G8929 Other chronic pain: Secondary | ICD-10-CM | POA: Insufficient documentation

## 2012-04-29 DIAGNOSIS — M79609 Pain in unspecified limb: Secondary | ICD-10-CM | POA: Insufficient documentation

## 2012-04-29 DIAGNOSIS — M5137 Other intervertebral disc degeneration, lumbosacral region: Secondary | ICD-10-CM | POA: Insufficient documentation

## 2012-04-29 DIAGNOSIS — M47817 Spondylosis without myelopathy or radiculopathy, lumbosacral region: Secondary | ICD-10-CM

## 2012-04-29 DIAGNOSIS — M5126 Other intervertebral disc displacement, lumbar region: Secondary | ICD-10-CM

## 2012-04-29 DIAGNOSIS — M5116 Intervertebral disc disorders with radiculopathy, lumbar region: Secondary | ICD-10-CM

## 2012-04-29 MED ORDER — METHADONE HCL 10 MG PO TABS: 20.0000 mg | ORAL_TABLET | Freq: Two times a day (BID) | ORAL | Status: DC

## 2012-04-29 MED ORDER — OXYCODONE-ACETAMINOPHEN 7.5-325 MG PO TABS
1.0000 | ORAL_TABLET | Freq: Three times a day (TID) | ORAL | Status: DC | PRN
Start: 2012-04-29 — End: 2012-05-21

## 2012-04-29 NOTE — Progress Notes (Signed)
Subjective:    Patient ID: Joshua [name], male    DOB: Jun 26, 1975, 37 y.o.   MRN: [id number]  HPI The patient complains about chronic low back pain which radiates into his posterior left LE.  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   Pain Inventory Average Pain 5 Pain Right Now 6 My pain is sharp, stabbing and aching  In the last 24 hours, has pain interfered with the following? General activity 4 Relation with others 5 Enjoyment of life 6 What TIME of day is your pain at its worst? morning and evening Sleep (in general) Fair  Pain is worse with: walking, bending, sitting, standing and some activites Pain improves with: rest, therapy/exercise and medication Relief from Meds: 6  Mobility how many minutes can you walk? 30 ability to climb steps?  yes do you drive?  yes Do you have any goals in this area?  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  no  Physicians involved in your care Any changes since last visit?  no   Family History  Problem Relation Age of Onset  . Hypertension Mother   . Diabetes Father    History   Social History  . Marital Status: Single    Spouse Name: N/A    Number of Children: N/A  . Years of Education: N/A   Social History Main Topics  . Smoking status: Current Every Day Smoker -- 1.00 packs/day  . Smokeless tobacco: Never Used  . Alcohol Use: None  . Drug Use: None  . Sexually Active: None   Other Topics Concern  . None   Social History Narrative  . None   Past Surgical History  Procedure Laterality Date  . Knee surgery     Past Medical History  Diagnosis Date  . Disorders of sacrum   . Displacement of lumbar intervertebral disc without myelopathy   . Thoracic or  lumbosacral neuritis or radiculitis, unspecified   . Facet syndrome, lumbar   . Lumbago    BP 131/64  Pulse 73  Resp 14  Ht 5\' 8"  (1.727 m)  Wt 258 lb (117.028 kg)  BMI 39.24 kg/m2  SpO2 99%      Review of Systems  Musculoskeletal: Positive for back pain.  All other systems reviewed and are negative.       Objective:   Physical Exam Constitutional: He is oriented to person, place, and time. He appears well-developed and well-nourished.  HENT:  Head: Normocephalic.  Neck: Neck supple.  Musculoskeletal: He exhibits tenderness.  Neurological: He is alert and oriented to person, place, and time.  Skin: Skin is warm and dry.  Psychiatric: He has a normal mood and affect.  Symmetric normal motor tone is noted throughout. Normal muscle bulk. Muscle testing reveals 5/5 muscle strength of the upper extremity, and 5/5 of the lower extremity , except left gastrocnemius 4/5. Full range of motion in upper and lower extremities. ROM of spine is restricted.  DTR in the upper and lower extremity are present and symmetric 2+. No clonus is noted.  Patient arises from chair without difficulty. Narrow based gait with normal arm swing bilateral , able to stand on heels and toes . Tandem walk  is stable.         Assessment & Plan:  This is a 37 year old male with  1. Back pain radiating into his left leg in S1 distribution  2. lumbar degenerative disc disease  Plan :  Continue with medication, continue with core exercises. Advised patient to work out in the pool of his boss. 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 Refilled his pain medication for 1 months, he will pick up prescription next month. He does not have any insurance. Follow up in 2 month.

## 2012-04-29 NOTE — Patient Instructions (Signed)
Continue with your core exercises.

## 2012-04-30 ENCOUNTER — Ambulatory Visit: Payer: Self-pay | Admitting: Physical Medicine and Rehabilitation

## 2012-05-21 ENCOUNTER — Telehealth: Payer: Self-pay

## 2012-05-21 DIAGNOSIS — M545 Low back pain, unspecified: Secondary | ICD-10-CM

## 2012-05-21 DIAGNOSIS — M5116 Intervertebral disc disorders with radiculopathy, lumbar region: Secondary | ICD-10-CM

## 2012-05-21 MED ORDER — OXYCODONE-ACETAMINOPHEN 7.5-325 MG PO TABS: 1.0000 | ORAL_TABLET | Freq: Three times a day (TID) | ORAL | Status: DC | PRN

## 2012-05-21 MED ORDER — METHADONE HCL 10 MG PO TABS: 20.0000 mg | ORAL_TABLET | Freq: Two times a day (BID) | ORAL | Status: DC

## 2012-05-21 NOTE — Telephone Encounter (Signed)
Patient needs medication refill.  Did not say which one.

## 2012-05-21 NOTE — Telephone Encounter (Signed)
Last fill on his methadone and percocet was 04/29/12. Prescriptions can be picked up on Monday 05/25/12 not to be filled before 05/26/12. RX printed for Clydie Braun to sign.

## 2012-05-22 NOTE — Telephone Encounter (Signed)
Patients fiance Joshua [name] will pick up his scripts.

## 2012-06-24 ENCOUNTER — Encounter: Payer: Self-pay | Attending: Physical Medicine and Rehabilitation | Admitting: Physical Medicine and Rehabilitation

## 2012-06-24 ENCOUNTER — Encounter: Payer: Self-pay | Admitting: Physical Medicine and Rehabilitation

## 2012-06-24 VITALS — BP 156/84 | HR 76 | Resp 14 | Ht 68.0 in | Wt 252.0 lb

## 2012-06-24 DIAGNOSIS — M545 Low back pain, unspecified: Secondary | ICD-10-CM | POA: Insufficient documentation

## 2012-06-24 DIAGNOSIS — M5116 Intervertebral disc disorders with radiculopathy, lumbar region: Secondary | ICD-10-CM

## 2012-06-24 DIAGNOSIS — M5126 Other intervertebral disc displacement, lumbar region: Secondary | ICD-10-CM | POA: Insufficient documentation

## 2012-06-24 DIAGNOSIS — M47817 Spondylosis without myelopathy or radiculopathy, lumbosacral region: Secondary | ICD-10-CM

## 2012-06-24 MED ORDER — OXYCODONE-ACETAMINOPHEN 7.5-325 MG PO TABS: 1.0000 | ORAL_TABLET | Freq: Three times a day (TID) | ORAL | Status: DC | PRN

## 2012-06-24 MED ORDER — METHADONE HCL 10 MG PO TABS: 20.0000 mg | ORAL_TABLET | Freq: Two times a day (BID) | ORAL | Status: DC

## 2012-06-24 NOTE — Progress Notes (Signed)
Subjective:    Patient ID: Joshua [name], male    DOB: [birth year], 37 y.o.   MRN: [id number]  HPI The patient complains about chronic low back pain which radiates into his posterior left LE.  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   Pain Inventory Average Pain 5 Pain Right Now 5 My pain is sharp, stabbing, tingling and aching  In the last 24 hours, has pain interfered with the following? General activity 5 Relation with others 5 Enjoyment of life 5 What TIME of day is your pain at its worst? morning and evening Sleep (in general) Fair  Pain is worse with: walking, bending, sitting and standing Pain improves with: rest, therapy/exercise and medication Relief from Meds: 6  Mobility walk without assistance how many minutes can you walk? 30 ability to climb steps?  yes do you drive?  yes Do you have any goals in this area?  no  Function employed # of hrs/week 20-40 Do you have any goals in this area?  no  Neuro/Psych tingling  Prior Studies Any changes since last visit?  no  Physicians involved in your care Any changes since last visit?  no   Family History  Problem Relation Age of Onset  . Hypertension Mother   . Diabetes Father    History   Social History  . Marital Status: Single    Spouse Name: N/A    Number of Children: N/A  . Years of Education: N/A   Social History Main Topics  . Smoking status: Current Every Day Smoker -- 1.00 packs/day  . Smokeless tobacco: Never Used  . Alcohol Use: None  . Drug Use: None  . Sexually Active: None   Other Topics Concern  . None   Social History Narrative  . None   Past Surgical History  Procedure Laterality Date  . Knee surgery     Past Medical History  Diagnosis Date  . Disorders of sacrum   . Displacement of lumbar intervertebral disc  without myelopathy   . Thoracic or lumbosacral neuritis or radiculitis, unspecified   . Facet syndrome, lumbar   . Lumbago    BP 156/84  Pulse 76  Resp 14  Ht 5\' 8"  (1.727 m)  Wt 252 lb (114.306 kg)  BMI 38.33 kg/m2  SpO2 98%     Review of Systems  Musculoskeletal: Positive for back pain.  All other systems reviewed and are negative.       Objective:   Physical Exam Constitutional: He is oriented to person, place, and time. He appears well-developed and well-nourished.  HENT:  Head: Normocephalic.  Neck: Neck supple.  Musculoskeletal: He exhibits tenderness.  Neurological: He is alert and oriented to person, place, and time.  Skin: Skin is warm and dry.  Psychiatric: He has a normal mood and affect.  Symmetric normal motor tone is noted throughout. Normal muscle bulk. Muscle testing reveals 5/5 muscle strength of the upper extremity, and 5/5 of the lower extremity , except left gastrocnemius 4/5. Full range of motion in upper and lower extremities. ROM of spine is restricted.  DTR in the upper and lower extremity are present and symmetric 2+. No clonus is noted.  Patient arises from chair without difficulty. Narrow based gait with normal arm swing bilateral , able to stand on heels  and toes . Tandem walk is stable.         Assessment & Plan:  This is a 37 year old male with  1. Back pain radiating into his left leg in S1 distribution  2. lumbar degenerative disc disease  Plan :  Continue with medication, continue with core exercises. Advised patient to work out in the pool of his boss. 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 Refilled his pain medication for 2 months, to be filled when due. Marland Kitchen He does not have any insurance.  Follow up in 2 month

## 2012-06-24 NOTE — Patient Instructions (Signed)
Continue with your stretching and exercise program

## 2012-06-29 ENCOUNTER — Ambulatory Visit: Payer: Self-pay | Admitting: Physical Medicine and Rehabilitation

## 2012-06-30 ENCOUNTER — Ambulatory Visit: Payer: Self-pay | Admitting: Physical Medicine and Rehabilitation

## 2012-08-21 ENCOUNTER — Encounter: Payer: Self-pay | Admitting: Physical Medicine and Rehabilitation

## 2012-08-21 ENCOUNTER — Encounter: Payer: Self-pay | Attending: Physical Medicine and Rehabilitation | Admitting: Physical Medicine and Rehabilitation

## 2012-08-21 VITALS — BP 127/78 | HR 78 | Resp 14 | Ht 68.0 in | Wt 255.0 lb

## 2012-08-21 DIAGNOSIS — M5137 Other intervertebral disc degeneration, lumbosacral region: Secondary | ICD-10-CM | POA: Insufficient documentation

## 2012-08-21 DIAGNOSIS — M5126 Other intervertebral disc displacement, lumbar region: Secondary | ICD-10-CM

## 2012-08-21 DIAGNOSIS — Z79899 Other long term (current) drug therapy: Secondary | ICD-10-CM

## 2012-08-21 DIAGNOSIS — Z5181 Encounter for therapeutic drug level monitoring: Secondary | ICD-10-CM

## 2012-08-21 DIAGNOSIS — M5116 Intervertebral disc disorders with radiculopathy, lumbar region: Secondary | ICD-10-CM

## 2012-08-21 DIAGNOSIS — M545 Low back pain, unspecified: Secondary | ICD-10-CM

## 2012-08-21 DIAGNOSIS — M47817 Spondylosis without myelopathy or radiculopathy, lumbosacral region: Secondary | ICD-10-CM

## 2012-08-21 DIAGNOSIS — M51379 Other intervertebral disc degeneration, lumbosacral region without mention of lumbar back pain or lower extremity pain: Secondary | ICD-10-CM | POA: Insufficient documentation

## 2012-08-21 MED ORDER — OXYCODONE-ACETAMINOPHEN 7.5-325 MG PO TABS: 1.0000 | ORAL_TABLET | Freq: Three times a day (TID) | ORAL | Status: DC | PRN

## 2012-08-21 MED ORDER — METHADONE HCL 10 MG PO TABS: 20.0000 mg | ORAL_TABLET | Freq: Two times a day (BID) | ORAL | Status: DC

## 2012-08-21 NOTE — Progress Notes (Signed)
Subjective:    Patient ID: Joshua [name], male    DOB: [birth year], 37 y.o.   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   Pain Inventory Average Pain 5 Pain Right Now 6 My pain is sharp, stabbing and aching  In the last 24 hours, has pain interfered with the following? General activity 5 Relation with others 5 Enjoyment of life 5 What TIME of day is your pain at its worst? all the time Sleep (in general) Fair  Pain is worse with: walking, bending, sitting, standing and some activites Pain improves with: rest, therapy/exercise and medication Relief from Meds: 5  Mobility walk without assistance how many minutes can you walk? 30 transfers alone Do you have any goals in this area?  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  no  Neuro/Psych No problems in this area  Prior Studies Any changes since last visit?  no  Physicians involved in your care Any changes since last visit?  no   Family History  Problem Relation Age of Onset  . Hypertension Mother   . Diabetes Father    History   Social History  . Marital Status: Single    Spouse Name: N/A    Number of Children: N/A  . Years of Education: N/A   Social History Main Topics  . Smoking status: Current Every Day Smoker -- 1.00 packs/day  . Smokeless tobacco: Never Used  . Alcohol Use: None  . Drug Use: None  . Sexually Active: None   Other Topics Concern  . None   Social History Narrative  . None   Past Surgical History  Procedure Laterality Date  . Knee surgery     Past Medical History  Diagnosis Date  . Disorders of sacrum   . Displacement of lumbar intervertebral disc without  myelopathy   . Thoracic or lumbosacral neuritis or radiculitis, unspecified   . Facet syndrome, lumbar   . Lumbago    BP 127/78  Pulse 78  Resp 14  Ht 5\' 8"  (1.727 m)  Wt 255 lb (115.667 kg)  BMI 38.78 kg/m2  SpO2 99%     Review of Systems  Musculoskeletal: Positive for back pain.  All other systems reviewed and are negative.       Objective:   Physical Exam Constitutional: He is oriented to person, place, and time. He appears well-developed and well-nourished.  HENT:  Head: Normocephalic.  Neck: Neck supple.  Musculoskeletal: He exhibits tenderness.  Neurological: He is alert and oriented to person, place, and time.  Skin: Skin is warm and dry.  Psychiatric: He has a normal mood and affect.  Symmetric normal motor tone is noted throughout. Normal muscle bulk. Muscle testing reveals 5/5 muscle strength of the upper extremity, and 5/5 of the lower extremity , except left gastrocnemius 4/5. Full range of motion in upper and lower extremities. ROM of spine is restricted.  DTR in the upper and lower extremity are present and symmetric 2+. No clonus is noted.  Patient arises from chair without difficulty. Narrow based gait with normal arm swing bilateral , able to stand  on heels and toes . Tandem walk is stable.        Assessment & Plan:  This is a 37 year old male with  1. Back pain radiating into his left leg in S1 distribution  2. lumbar degenerative disc disease  Plan :  Continue with medication, continue with core exercises. Advised patient to work out in the pool and walk, when he is staying out of town and does not have much to do in the evening anyhow.. 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 Refilled his pain medication for 2 months, to be filled when due. 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  Follow up in 2 month

## 2012-08-21 NOTE — Patient Instructions (Signed)
Try to walk or get into the pool if possible.

## 2012-10-23 ENCOUNTER — Encounter: Payer: Self-pay | Attending: Physical Medicine and Rehabilitation | Admitting: Physical Medicine and Rehabilitation

## 2012-10-23 ENCOUNTER — Encounter: Payer: Self-pay | Admitting: Physical Medicine & Rehabilitation

## 2012-10-23 ENCOUNTER — Encounter: Payer: Self-pay | Admitting: Physical Medicine and Rehabilitation

## 2012-10-23 VITALS — BP 137/82 | HR 70 | Resp 14 | Ht 68.0 in | Wt 262.2 lb

## 2012-10-23 DIAGNOSIS — M51379 Other intervertebral disc degeneration, lumbosacral region without mention of lumbar back pain or lower extremity pain: Secondary | ICD-10-CM | POA: Insufficient documentation

## 2012-10-23 DIAGNOSIS — M545 Low back pain, unspecified: Secondary | ICD-10-CM

## 2012-10-23 DIAGNOSIS — M79609 Pain in unspecified limb: Secondary | ICD-10-CM | POA: Insufficient documentation

## 2012-10-23 DIAGNOSIS — M549 Dorsalgia, unspecified: Secondary | ICD-10-CM | POA: Insufficient documentation

## 2012-10-23 DIAGNOSIS — M5137 Other intervertebral disc degeneration, lumbosacral region: Secondary | ICD-10-CM | POA: Insufficient documentation

## 2012-10-23 DIAGNOSIS — M5126 Other intervertebral disc displacement, lumbar region: Secondary | ICD-10-CM

## 2012-10-23 DIAGNOSIS — M5116 Intervertebral disc disorders with radiculopathy, lumbar region: Secondary | ICD-10-CM

## 2012-10-23 MED ORDER — OXYCODONE-ACETAMINOPHEN 7.5-325 MG PO TABS: 1.0000 | ORAL_TABLET | Freq: Three times a day (TID) | ORAL | Status: DC | PRN

## 2012-10-23 MED ORDER — METHADONE HCL 10 MG PO TABS: 20.0000 mg | ORAL_TABLET | Freq: Two times a day (BID) | ORAL | Status: DC

## 2012-10-23 NOTE — Patient Instructions (Signed)
Try to walk some inbetween you long driving

## 2012-10-23 NOTE — Progress Notes (Signed)
Subjective:    Patient ID: Joshua [name], male    DOB: Sep 24, 1975, 37 y.o.   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   Pain Inventory Average Pain 5 Pain Right Now 6 My pain is sharp, stabbing and aching  In the last 24 hours, has pain interfered with the following? General activity 5 Relation with others 6 Enjoyment of life 6 What TIME of day is your pain at its worst? morning, day, evening, night Sleep (in general) Fair  Pain is worse with: walking, bending, sitting, standing and some activites Pain improves with: rest, therapy/exercise and medication Relief from Meds: 6  Mobility walk without assistance  Function employed # of hrs/week 40 Do you have any goals in this area?  yes  Neuro/Psych No problems in this area  Prior Studies Any changes since last visit?  no  Physicians involved in your care Any changes since last visit?  no   Family History  Problem Relation Age of Onset  . Hypertension Mother   . Diabetes Father    History   Social History  . Marital Status: Single    Spouse Name: N/A    Number of Children: N/A  . Years of Education: N/A   Social History Main Topics  . Smoking status: Current Every Day Smoker -- 1.00 packs/day  . Smokeless tobacco: Never Used  . Alcohol Use: None  . Drug Use: None  . Sexual Activity: None   Other Topics Concern  . None   Social History Narrative  . None   Past Surgical History  Procedure Laterality Date  . Knee surgery     Past Medical History  Diagnosis Date  . Disorders of sacrum   . Displacement of lumbar intervertebral disc without myelopathy   . Thoracic or lumbosacral neuritis or radiculitis, unspecified   . Facet  syndrome, lumbar   . Lumbago    BP 137/82  Pulse 70  Resp 14  Ht 5\' 8"  (1.727 m)  Wt 262 lb 3.2 oz (118.933 kg)  BMI 39.88 kg/m2  SpO2 96%    Review of Systems  All other systems reviewed and are negative.       Objective:   Physical Exam Constitutional: He is oriented to person, place, and time. He appears well-developed and well-nourished.  HENT:  Head: Normocephalic.  Neck: Neck supple.  Musculoskeletal: He exhibits tenderness.  Neurological: He is alert and oriented to person, place, and time.  Skin: Skin is warm and dry.  Psychiatric: He has a normal mood and affect.  Symmetric normal motor tone is noted throughout. Normal muscle bulk. Muscle testing reveals 5/5 muscle strength of the upper extremity, and 5/5 of the lower extremity , except left gastrocnemius 4/5. Full range of motion in upper and lower extremities. ROM of spine is restricted.  DTR in the upper and lower extremity are present and symmetric 2+. No clonus is noted.  Patient arises from chair without difficulty. Narrow based gait with normal arm swing bilateral , able to stand on heels and toes . Tandem walk is stable.        Assessment & Plan:  This is a 37 year  old male with  1. Back pain radiating into his left leg in S1 distribution  2. lumbar degenerative disc disease  Plan :  Continue with medication, continue with core exercises. Advised patient to work out in the pool and walk, when he is staying out of town and does not have much to do in the evening anyhow.. 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 Refilled his pain medication for 2 months, to be filled when due. 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  Follow up in 2 month

## 2012-12-21 ENCOUNTER — Encounter: Payer: Self-pay | Admitting: Physical Medicine and Rehabilitation

## 2012-12-21 ENCOUNTER — Encounter
Payer: BC Managed Care – PPO | Attending: Physical Medicine and Rehabilitation | Admitting: Physical Medicine and Rehabilitation

## 2012-12-21 ENCOUNTER — Encounter: Payer: Self-pay | Admitting: Physical Medicine & Rehabilitation

## 2012-12-21 VITALS — BP 143/77 | HR 89 | Resp 14 | Ht 68.0 in | Wt 263.0 lb

## 2012-12-21 DIAGNOSIS — M545 Low back pain, unspecified: Secondary | ICD-10-CM

## 2012-12-21 DIAGNOSIS — M51379 Other intervertebral disc degeneration, lumbosacral region without mention of lumbar back pain or lower extremity pain: Secondary | ICD-10-CM | POA: Insufficient documentation

## 2012-12-21 DIAGNOSIS — M5126 Other intervertebral disc displacement, lumbar region: Secondary | ICD-10-CM

## 2012-12-21 DIAGNOSIS — M5137 Other intervertebral disc degeneration, lumbosacral region: Secondary | ICD-10-CM | POA: Insufficient documentation

## 2012-12-21 DIAGNOSIS — M5116 Intervertebral disc disorders with radiculopathy, lumbar region: Secondary | ICD-10-CM

## 2012-12-21 DIAGNOSIS — M47817 Spondylosis without myelopathy or radiculopathy, lumbosacral region: Secondary | ICD-10-CM

## 2012-12-21 MED ORDER — OXYCODONE-ACETAMINOPHEN 7.5-325 MG PO TABS: 1.0000 | ORAL_TABLET | Freq: Three times a day (TID) | ORAL | Status: DC | PRN

## 2012-12-21 MED ORDER — METHADONE HCL 10 MG PO TABS: 20.0000 mg | ORAL_TABLET | Freq: Two times a day (BID) | ORAL | Status: DC

## 2012-12-21 NOTE — Progress Notes (Signed)
Subjective:    Patient ID: Joshua [name], male    DOB: 15-Oct-1975, 37 y.o.   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 The problem has been stable.  Pain Inventory Average Pain 4 Pain Right Now 5 My pain is sharp, stabbing, tingling and aching  In the last 24 hours, has pain interfered with the following? General activity 5 Relation with others 5 Enjoyment of life 6 What TIME of day is your pain at its worst? day, evening and night Sleep (in general) Fair  Pain is worse with: walking, bending, sitting, standing and some activites Pain improves with: rest, therapy/exercise and medication Relief from Meds: 6  Mobility how many minutes can you walk? 30 Do you have any goals in this area?  no  Function employed # of hrs/week 30-40 Do you have any goals in this area?  no  Neuro/Psych No problems in this area  Prior Studies Any changes since last visit?  no  Physicians involved in your care Any changes since last visit?  no   Family History  Problem Relation Age of Onset  . Hypertension Mother   . Diabetes Father    History   Social History  . Marital Status: Single    Spouse Name: N/A    Number of Children: N/A  . Years of Education: N/A   Social History Main Topics  . Smoking status: Current Every Day Smoker -- 1.00 packs/day  . Smokeless tobacco: Never Used  . Alcohol Use: None  . Drug Use: None  . Sexual Activity: None   Other Topics Concern  . None   Social History Narrative  . None   Past Surgical History  Procedure Laterality Date  . Knee surgery     Past Medical History  Diagnosis Date  . Disorders of sacrum   . Displacement of lumbar intervertebral disc without myelopathy    . Thoracic or lumbosacral neuritis or radiculitis, unspecified   . Facet syndrome, lumbar   . Lumbago    BP 143/77  Pulse 89  Resp 14  Ht 5\' 8"  (1.727 m)  Wt 263 lb (119.296 kg)  BMI 40 kg/m2  SpO2 98%     Review of Systems  All other systems reviewed and are negative.       Objective:   Physical Exam Constitutional: He is oriented to person, place, and time. He appears well-developed and well-nourished.  HENT:  Head: Normocephalic.  Neck: Neck supple.  Musculoskeletal: He exhibits tenderness.  Neurological: He is alert and oriented to person, place, and time.  Skin: Skin is warm and dry.  Psychiatric: He has a normal mood and affect.  Symmetric normal motor tone is noted throughout. Normal muscle bulk. Muscle testing reveals 5/5 muscle strength of the upper extremity, and 5/5 of the lower extremity , except left gastrocnemius 4/5. Full range of motion in upper and lower extremities. ROM of spine is restricted.  DTR in the upper and lower extremity are present and symmetric 2+. No clonus is noted.  Patient arises from chair without difficulty. Narrow based gait with normal arm swing bilateral , able to stand on heels and toes . Tandem walk  is stable.        Assessment & Plan:  This is a 37 year old male with  1. Back pain radiating into his left leg in S1 distribution  2. lumbar degenerative disc disease  Plan :  Continue with medication, continue with core exercises. Advised patient to work out in the pool and walk, when he is staying out of town and does not have much to do in the evening anyhow.. 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 Refilled his pain medication for 2 months, to be filled when due. 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  Follow up in 2 month

## 2012-12-21 NOTE — Patient Instructions (Signed)
Continue with your walking program. 

## 2013-02-10 ENCOUNTER — Other Ambulatory Visit: Payer: Self-pay | Admitting: *Deleted

## 2013-02-10 DIAGNOSIS — M545 Low back pain, unspecified: Secondary | ICD-10-CM

## 2013-02-10 DIAGNOSIS — M5116 Intervertebral disc disorders with radiculopathy, lumbar region: Secondary | ICD-10-CM

## 2013-02-10 MED ORDER — METHADONE HCL 10 MG PO TABS: 20.0000 mg | ORAL_TABLET | Freq: Two times a day (BID) | ORAL | Status: DC

## 2013-02-10 MED ORDER — OXYCODONE-ACETAMINOPHEN 7.5-325 MG PO TABS: 1.0000 | ORAL_TABLET | Freq: Three times a day (TID) | ORAL | Status: DC | PRN

## 2013-02-10 NOTE — Telephone Encounter (Signed)
2 months rx printed early for controlled medication for the visit with RN on 02/17/13 (to be signed by MD) 

## 2013-02-15 ENCOUNTER — Ambulatory Visit: Payer: Self-pay | Admitting: Physical Medicine and Rehabilitation

## 2013-02-16 ENCOUNTER — Encounter: Payer: BC Managed Care – PPO | Attending: Physical Medicine & Rehabilitation | Admitting: *Deleted

## 2013-02-16 ENCOUNTER — Encounter: Payer: Self-pay | Admitting: *Deleted

## 2013-02-16 VITALS — BP 138/69 | HR 72 | Resp 14 | Ht 68.0 in | Wt 263.0 lb

## 2013-02-16 DIAGNOSIS — M545 Low back pain, unspecified: Secondary | ICD-10-CM

## 2013-02-16 DIAGNOSIS — M51379 Other intervertebral disc degeneration, lumbosacral region without mention of lumbar back pain or lower extremity pain: Secondary | ICD-10-CM | POA: Insufficient documentation

## 2013-02-16 DIAGNOSIS — M5137 Other intervertebral disc degeneration, lumbosacral region: Secondary | ICD-10-CM | POA: Insufficient documentation

## 2013-02-16 DIAGNOSIS — M5116 Intervertebral disc disorders with radiculopathy, lumbar region: Secondary | ICD-10-CM

## 2013-02-16 NOTE — Patient Instructions (Signed)
Follow up in 2 months with RN for med refill and pill count

## 2013-02-16 NOTE — Progress Notes (Signed)
Here for pill count and medication refills. Methadone 10 mg # 120 Fill date 01/21/13     Today NV#18  Oxycodone 7.5/325 # 90  Fill date 01/21/13 Today NV# 10. Pill counts appropriate and refills given for each med for 2 months.  Follow up with RN in 2 months for med refill and pill count. No changes in pain levels or to any medication on his list. VSS

## 2013-04-13 ENCOUNTER — Other Ambulatory Visit: Payer: Self-pay | Admitting: *Deleted

## 2013-04-13 DIAGNOSIS — M5116 Intervertebral disc disorders with radiculopathy, lumbar region: Secondary | ICD-10-CM

## 2013-04-13 DIAGNOSIS — M545 Low back pain, unspecified: Secondary | ICD-10-CM

## 2013-04-13 MED ORDER — METHADONE HCL 10 MG PO TABS: 20.0000 mg | ORAL_TABLET | Freq: Two times a day (BID) | ORAL | Status: DC

## 2013-04-13 MED ORDER — OXYCODONE-ACETAMINOPHEN 7.5-325 MG PO TABS: 1.0000 | ORAL_TABLET | Freq: Three times a day (TID) | ORAL | Status: DC | PRN

## 2013-04-13 NOTE — Telephone Encounter (Signed)
RX printed early for controlled medication for the visit with RN on 04/19/13 (to be signed by MD) 

## 2013-04-20 ENCOUNTER — Encounter: Payer: Self-pay | Admitting: *Deleted

## 2013-04-20 ENCOUNTER — Encounter: Payer: BC Managed Care – PPO | Attending: Physical Medicine & Rehabilitation | Admitting: *Deleted

## 2013-04-20 VITALS — BP 130/46 | HR 69 | Resp 14

## 2013-04-20 DIAGNOSIS — F17200 Nicotine dependence, unspecified, uncomplicated: Secondary | ICD-10-CM | POA: Insufficient documentation

## 2013-04-20 DIAGNOSIS — M5126 Other intervertebral disc displacement, lumbar region: Secondary | ICD-10-CM

## 2013-04-20 DIAGNOSIS — Z5181 Encounter for therapeutic drug level monitoring: Secondary | ICD-10-CM

## 2013-04-20 DIAGNOSIS — M79609 Pain in unspecified limb: Secondary | ICD-10-CM | POA: Insufficient documentation

## 2013-04-20 DIAGNOSIS — M5137 Other intervertebral disc degeneration, lumbosacral region: Secondary | ICD-10-CM | POA: Insufficient documentation

## 2013-04-20 DIAGNOSIS — M549 Dorsalgia, unspecified: Secondary | ICD-10-CM | POA: Insufficient documentation

## 2013-04-20 DIAGNOSIS — M51379 Other intervertebral disc degeneration, lumbosacral region without mention of lumbar back pain or lower extremity pain: Secondary | ICD-10-CM | POA: Insufficient documentation

## 2013-04-20 DIAGNOSIS — Z79899 Other long term (current) drug therapy: Secondary | ICD-10-CM

## 2013-04-20 NOTE — Progress Notes (Signed)
Here for pill count and medication refills. Methadone 10 mg # 120  Fill date 03/22/13    Today NV# 6 Oxycodone 7.5/325 # 90 Today NV# 5   VSS   No changes since last visit.  He is still driving a truck for a living. He has had no falls in past 6 months and is a low fall risk.   His opioid risk score is 1 due to his age.  His pill counts are appropriate.  He will return to the clinic for an appointment with Dr Riley KillSwartz in 2 months.  He was given rx for both methadone and oxycodone for 2 months. A random UDS was done today.

## 2013-04-20 NOTE — Patient Instructions (Signed)
Follow up in two months with DR Riley KillSwartz

## 2013-04-23 ENCOUNTER — Ambulatory Visit: Payer: BC Managed Care – PPO | Admitting: Physical Medicine & Rehabilitation

## 2013-05-05 ENCOUNTER — Telehealth: Payer: Self-pay | Admitting: Physical Medicine & Rehabilitation

## 2013-05-05 NOTE — Telephone Encounter (Signed)
Message copied by Ranelle OysterSWARTZ, Chyane Greer T on Wed May 05, 2013 10:40 AM ------      Message from: Doreene ElandSHUMAKER, SYBIL W      Created: Wed Apr 28, 2013  8:23 AM       Urine drug screen is INCONSISTENT because he has a positive for tramadol and there is not record of him being prescribed tramadol and this was not reported. There is no record on [facility]. ------

## 2013-05-05 NOTE — Telephone Encounter (Signed)
I spoke with Joshua [name]. He says that he did take a tramadol because his keg was hurting one day.  I warned him not to do this again because it is not a prescribed medication.  He says he will not. His UDS have always been consistent so this is a warning this time.

## 2013-05-05 NOTE — Telephone Encounter (Signed)
I don't recall that Joshua [name] has been inconsistent before, correct?  Given that, I will give him one more chance, but please formally warn him that the next discrepancy will mean discharge from the clinic  Thanks.

## 2013-06-18 ENCOUNTER — Encounter
Payer: BC Managed Care – PPO | Attending: Physical Medicine & Rehabilitation | Admitting: Physical Medicine & Rehabilitation

## 2013-06-18 ENCOUNTER — Encounter: Payer: Self-pay | Admitting: Physical Medicine & Rehabilitation

## 2013-06-18 VITALS — BP 145/85 | HR 74 | Resp 14 | Ht 68.0 in | Wt 265.0 lb

## 2013-06-18 DIAGNOSIS — M545 Low back pain, unspecified: Secondary | ICD-10-CM

## 2013-06-18 DIAGNOSIS — M5116 Intervertebral disc disorders with radiculopathy, lumbar region: Secondary | ICD-10-CM

## 2013-06-18 DIAGNOSIS — M5126 Other intervertebral disc displacement, lumbar region: Secondary | ICD-10-CM

## 2013-06-18 MED ORDER — OXYCODONE-ACETAMINOPHEN 7.5-325 MG PO TABS: 1.0000 | ORAL_TABLET | Freq: Three times a day (TID) | ORAL | Status: DC | PRN

## 2013-06-18 MED ORDER — METHADONE HCL 10 MG PO TABS: 20.0000 mg | ORAL_TABLET | Freq: Two times a day (BID) | ORAL | Status: DC

## 2013-06-18 NOTE — Progress Notes (Signed)
Subjective:    Patient ID: Joshua [name], male    DOB: 12/18/1975, 38 y.o.   MRN: [id number]  HPI  Joshua [name] is back regarding his chronic pain. His pain levels have been fairly steady. He quit smoking about 3 weeks ago. He doesn't notice any difference as it pertains to his pain.   Methadone and percocet remain fairly effective for pain control.   His bowel and bladder are regulated.  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 He doesn't have to lift as part of his job. 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    Pain Inventory Average Pain 5 Pain Right Now 6 My pain is constant  In the last 24 hours, has pain interfered with the following? General activity 5 Relation with others 4 Enjoyment of life 5 What TIME of day is your pain at its worst? morning daytime and evening Sleep (in general) Fair  Pain is worse with: walking, bending, sitting and standing Pain improves with: rest, therapy/exercise and medication Relief from Meds: 5  Mobility walk without assistance do you drive?  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  no  Physicians involved in your care Any changes since last visit?  no   Family History  Problem Relation Age of Onset  . Hypertension Mother   . Diabetes Father    History   Social History  . Marital Status: Single    Spouse Name: N/A    Number of Children: N/A  . Years of Education: N/A   Social History Main Topics  . Smoking status: Current Every Day Smoker -- 1.00 packs/day  . Smokeless tobacco: Never Used  . Alcohol Use: None  . Drug Use: None  . Sexual Activity: None   Other Topics Concern  . None   Social History Narrative  . None   Past Surgical History  Procedure Laterality Date  . Knee surgery     Past Medical History  Diagnosis Date  . Disorders of sacrum   . Displacement of lumbar intervertebral  disc without myelopathy   . Thoracic or lumbosacral neuritis or radiculitis, unspecified   . Facet syndrome, lumbar   . Lumbago    BP 145/85  Pulse 74  Resp 14  Ht 5\' 8"  (1.727 m)  Wt 265 lb (120.203 kg)  BMI 40.30 kg/m2  SpO2 97%  Opioid Risk Score:   Fall Risk Score: Low Fall Risk (0-5 points) (educated and handout on fall prevention in the home given at previous visit) Review of Systems  Musculoskeletal:       Spasms  All other systems reviewed and are negative.      Objective:   Physical Exam  Constitutional: He is oriented to person, place, and time. He appears well-developed and well-nourished.  HENT:  Head: Normocephalic and atraumatic.  Eyes: Conjunctivae and EOM are normal. Pupils are equal, round, and reactive to light.  Neck: Normal range of motion. Neck supple.  Cardiovascular: Normal rate and regular rhythm.  Pulmonary/Chest: Effort normal and breath sounds normal.  Abdominal: Soft. Bowel sounds are normal.  Musculoskeletal:  Lumbar back: He exhibits decreased range of motion and tenderness. He exhibits no edema and no deformity. He is able to bend and touch his toes with some pain in the lower lumbar spine.   Weight is unchanged. cogntively he's alert an appropriate.  Neurological:  He is alert and oriented to person, place, and time.   Assessment & Plan:   ASSESSMENT:  1. Chronic low back pain  2. Degenerative disk disease lumbar spine.    PLAN:  1. methadone 10 mg 2 b.i.d. 120 with no refill.    2. Percocet 7.5/325 one p.o. q.6 h. p.r.n. 90 with no refill   3. My NP will see him in about 2 Months. 15 minutes of face to face patient care time were spent during this visit. All questions were encouraged and answered. Second rx's were provided for next month. Pt remains compliant with our program.

## 2013-06-18 NOTE — Patient Instructions (Signed)
WORK ON REGULAR STRETCHING WHILE YOUR ARE AT WORK!!!

## 2013-08-16 ENCOUNTER — Encounter
Payer: BC Managed Care – PPO | Attending: Physical Medicine & Rehabilitation | Admitting: Physical Medicine & Rehabilitation

## 2013-08-16 ENCOUNTER — Encounter: Payer: Self-pay | Admitting: Physical Medicine & Rehabilitation

## 2013-08-16 VITALS — BP 159/83 | HR 75 | Resp 14 | Wt 270.8 lb

## 2013-08-16 DIAGNOSIS — M5116 Intervertebral disc disorders with radiculopathy, lumbar region: Secondary | ICD-10-CM

## 2013-08-16 DIAGNOSIS — M5126 Other intervertebral disc displacement, lumbar region: Secondary | ICD-10-CM | POA: Insufficient documentation

## 2013-08-16 DIAGNOSIS — M545 Low back pain, unspecified: Secondary | ICD-10-CM | POA: Insufficient documentation

## 2013-08-16 MED ORDER — METHADONE HCL 10 MG PO TABS: 20.0000 mg | ORAL_TABLET | Freq: Two times a day (BID) | ORAL | Status: DC

## 2013-08-16 MED ORDER — OXYCODONE-ACETAMINOPHEN 7.5-325 MG PO TABS: 1.0000 | ORAL_TABLET | Freq: Three times a day (TID) | ORAL | Status: DC | PRN

## 2013-08-16 NOTE — Patient Instructions (Signed)
PLEASE CALL ME WITH ANY PROBLEMS OR QUESTIONS (#[phone]).      

## 2013-08-16 NOTE — Progress Notes (Signed)
Subjective:    Patient ID: Joshua [name], male    DOB: 08-Nov-1975, 38 y.o.   MRN: [id number]  HPI  Refael is back regarding his chronic back pain. He continues to drive a truck. He has been doing more stretching before and during work and this seems to help his back. His medications continue to be effective for him.   He does a little exercise and walking when he's not working.   Pain Inventory Average Pain 5 Pain Right Now 5 My pain is constant  In the last 24 hours, has pain interfered with the following? General activity 5 Relation with others 5 Enjoyment of life 5 What TIME of day is your pain at its worst? morning and daytime Sleep (in general) Fair  Pain is worse with: walking, bending, sitting and standing Pain improves with: rest, therapy/exercise and medication Relief from Meds: 5  Mobility walk without assistance ability to climb steps?  yes do you drive?  yes  Function employed # of hrs/week 30-40  Neuro/Psych No problems in this area  Prior Studies Any changes since last visit?  no  Physicians involved in your care Any changes since last visit?  no   Family History  Problem Relation Age of Onset  . Hypertension Mother   . Diabetes Father    History   Social History  . Marital Status: Single    Spouse Name: N/A    Number of Children: N/A  . Years of Education: N/A   Social History Main Topics  . Smoking status: Current Every Day Smoker -- 1.00 packs/day  . Smokeless tobacco: Never Used  . Alcohol Use: None  . Drug Use: None  . Sexual Activity: None   Other Topics Concern  . None   Social History Narrative  . None   Past Surgical History  Procedure Laterality Date  . Knee surgery     Past Medical History  Diagnosis Date  . Disorders of sacrum   . Displacement of lumbar intervertebral disc without myelopathy   . Thoracic or lumbosacral neuritis or radiculitis, unspecified   . Facet syndrome, lumbar   . Lumbago    BP 159/83   Pulse 75  Resp 14  Wt 270 lb 12.8 oz (122.834 kg)  SpO2 100%  Opioid Risk Score:   Fall Risk Score: Low Fall Risk (0-5 points) (educated and handout given for fall prevention in the home at previous visit)  Review of Systems  Musculoskeletal: Positive for back pain.  All other systems reviewed and are negative.      Objective:   Physical Exam Constitutional: He is oriented to person, place, and time. He appears well-developed and well-nourished. Obese  HENT:  Head: Normocephalic and atraumatic.  Eyes: Conjunctivae and EOM are normal. Pupils are equal, round, and reactive to light.  Neck: Normal range of motion. Neck supple.  Cardiovascular: Normal rate and regular rhythm.  Pulmonary/Chest: Effort normal and breath sounds normal.  Abdominal: Soft. Bowel sounds are normal.  Musculoskeletal:  Lumbar back: He exhibits decreased range of motion and tenderness. He exhibits no edema and no deformity. He is able to bend and touch his toes with some pain in the lower lumbar spine.   Cogntively he's alert an appropriate.  Neurological: He is alert and oriented to person, place, and time.    Assessment & Plan:   ASSESSMENT:  1. Chronic low back pain  2. Degenerative disk disease lumbar spine.    PLAN:  1. methadone 10  mg 2 b.i.d. 120 with no refill.  2. Percocet 7.5/325 one p.o. q.6 h. p.r.n. 90 with no refill  3. My NP will see him in about 2 Months. 15 minutes of face to face patient care time were spent during this visit. All questions were encouraged and answered.   Second rx's were provided for next month. Pt remains compliant and consistent. Reminded Gohan that i want him to keep up with exercise and regular stretching

## 2013-08-20 ENCOUNTER — Telehealth: Payer: Self-pay | Admitting: Family Medicine

## 2013-08-20 NOTE — Telephone Encounter (Signed)
Patient aware we do not have any appts today or tomorrow [facility]

## 2013-10-11 ENCOUNTER — Encounter: Payer: BC Managed Care – PPO | Attending: Physical Medicine & Rehabilitation | Admitting: Registered Nurse

## 2013-10-11 ENCOUNTER — Encounter: Payer: Self-pay | Admitting: Registered Nurse

## 2013-10-11 VITALS — BP 145/71 | HR 65 | Resp 14 | Ht 68.0 in | Wt 273.0 lb

## 2013-10-11 DIAGNOSIS — M545 Low back pain, unspecified: Secondary | ICD-10-CM | POA: Diagnosis not present

## 2013-10-11 DIAGNOSIS — Z79899 Other long term (current) drug therapy: Secondary | ICD-10-CM | POA: Diagnosis not present

## 2013-10-11 DIAGNOSIS — M5126 Other intervertebral disc displacement, lumbar region: Secondary | ICD-10-CM | POA: Diagnosis present

## 2013-10-11 DIAGNOSIS — Z5181 Encounter for therapeutic drug level monitoring: Secondary | ICD-10-CM | POA: Diagnosis not present

## 2013-10-11 DIAGNOSIS — M5116 Intervertebral disc disorders with radiculopathy, lumbar region: Secondary | ICD-10-CM

## 2013-10-11 MED ORDER — OXYCODONE-ACETAMINOPHEN 7.5-325 MG PO TABS: 1.0000 | ORAL_TABLET | Freq: Three times a day (TID) | ORAL | Status: DC | PRN

## 2013-10-11 MED ORDER — METHADONE HCL 10 MG PO TABS: 20.0000 mg | ORAL_TABLET | Freq: Two times a day (BID) | ORAL | Status: DC

## 2013-10-11 MED ORDER — OXYCODONE-ACETAMINOPHEN 7.5-325 MG PO TABS
1.0000 | ORAL_TABLET | Freq: Three times a day (TID) | ORAL | Status: DC | PRN
Start: 2013-10-11 — End: 2013-10-11

## 2013-10-11 NOTE — Progress Notes (Signed)
Subjective:    Patient ID: Joshua [name], male    DOB: 26-Apr-1975, 38 y.o.   MRN: [id number]  HPI: Joshua [name] is a 38 year old male who returns for follow up for chronic pain and medication refill. He says his pain is in his lower back. He rates his pain 5. His current exercise regime is performing stretching exercises and walking. 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  Pain Inventory Average Pain 4 Pain Right Now 5 My pain is sharp, stabbing and aching  In the last 24 hours, has pain interfered with the following? General activity 5 Relation with others 6 Enjoyment of life 5 What TIME of day is your pain at its worst? morning, daytime, evening Sleep (in general) Fair  Pain is worse with: walking, bending, sitting, standing and some activites Pain improves with: rest, therapy/exercise and medication Relief from Meds: 5  Mobility walk without assistance how many minutes can you walk? 30 ability to climb steps?  yes do you drive?  yes transfers alone  Function employed # of hrs/week 40  Neuro/Psych No problems in this area  Prior Studies Any changes since last visit?  no  Physicians involved in your care Any changes since last visit?  no   Family History  Problem Relation Age of Onset  . Hypertension Mother   . Diabetes Father    History   Social History  . Marital Status: Single    Spouse Name: N/A    Number of Children: N/A  . Years of Education: N/A   Social History Main Topics  . Smoking status: Current Every Day Smoker -- 1.00 packs/day  . Smokeless tobacco: Never Used  . Alcohol Use: None  . Drug Use: None  . Sexual Activity: None   Other Topics Concern  . None   Social History Narrative  . None   Past Surgical History  Procedure Laterality Date  . Knee surgery     Past Medical History  Diagnosis Date  . Disorders of sacrum   . Displacement of lumbar intervertebral disc without myelopathy   . Thoracic or  lumbosacral neuritis or radiculitis, unspecified   . Facet syndrome, lumbar   . Lumbago    BP 145/71  Pulse 65  Resp 14  Ht 5\' 8"  (1.727 m)  Wt 273 lb (123.832 kg)  BMI 41.52 kg/m2  SpO2 98%  Opioid Risk Score:   Fall Risk Score: Low Fall Risk (0-5 points) (pt educated on fall risk, brochure given to pt previously)    Review of Systems  Musculoskeletal: Positive for back pain.  All other systems reviewed and are negative.      Objective:   Physical Exam  Nursing note and vitals reviewed. Constitutional: He is oriented to person, place, and time. He appears well-developed and well-nourished.  HENT:  Head: Normocephalic and atraumatic.  Neck: Normal range of motion. Neck supple.  Cardiovascular: Normal rate and regular rhythm.   Pulmonary/Chest: Effort normal and breath sounds normal.  Musculoskeletal:  Normal Muscle Bulk and Muscle Testing Reveals: Upper Extremities: Full ROM and Muscle Strength 5/5 Spinal Forward Flexion: 80 Degrees and Extension 20 Degrees Lower Extremities: Full ROM and Muscle Strength 5/5 Arises from chair with ease Narrow Based Gait  Neurological: He is alert and oriented to person, place, and time.  Skin: Skin is warm and dry.  Psychiatric: He has a normal mood and affect.  Assessment & Plan:  1. Chronic low back pain: Continue with Exercise Regime 2. Degenerative disk disease lumbar spine:  Refilled Methadone 10 mg take two tablets Twice a day #120 and Percocet 7.5/325 mg one tablet every 8 hours prn #90. Second scripts given for the following month.  20 minutes of face to face patient care time was spent during this visit. All questions were encouraged and answered.  F/U in 2 Months

## 2013-12-13 ENCOUNTER — Encounter: Payer: BC Managed Care – PPO | Attending: Physical Medicine & Rehabilitation | Admitting: Registered Nurse

## 2013-12-13 ENCOUNTER — Encounter: Payer: Self-pay | Admitting: Registered Nurse

## 2013-12-13 VITALS — BP 149/96 | HR 67 | Resp 14 | Wt 278.0 lb

## 2013-12-13 DIAGNOSIS — M5116 Intervertebral disc disorders with radiculopathy, lumbar region: Secondary | ICD-10-CM | POA: Insufficient documentation

## 2013-12-13 DIAGNOSIS — Z5181 Encounter for therapeutic drug level monitoring: Secondary | ICD-10-CM | POA: Diagnosis present

## 2013-12-13 DIAGNOSIS — M545 Low back pain: Secondary | ICD-10-CM | POA: Diagnosis not present

## 2013-12-13 DIAGNOSIS — Z79899 Other long term (current) drug therapy: Secondary | ICD-10-CM | POA: Diagnosis present

## 2013-12-13 MED ORDER — METHADONE HCL 10 MG PO TABS: 20.0000 mg | ORAL_TABLET | Freq: Two times a day (BID) | ORAL | Status: DC

## 2013-12-13 MED ORDER — OXYCODONE-ACETAMINOPHEN 7.5-325 MG PO TABS: 1.0000 | ORAL_TABLET | Freq: Three times a day (TID) | ORAL | Status: DC | PRN

## 2013-12-13 NOTE — Progress Notes (Signed)
Subjective:    Patient ID: Joshua DauerBrad D Kilpatrick, male    DOB: [birth year], 38 y.o.   MRN: [id number]  HPI: [name] is a 38 year old male who returns for follow up for chronic pain and medication refill. He says his pain is in his lower back. He rates his pain 6. His current exercise regime is performing stretching and core exercises and walking. 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   Pain Inventory Average Pain 6 Pain Right Now 6 My pain is sharp, stabbing and aching  In the last 24 hours, has pain interfered with the following? General activity 6 Relation with others 5 Enjoyment of life 6 What TIME of day is your pain at its worst? varies Sleep (in general) Fair  Pain is worse with: walking, bending, sitting, standing and some activites Pain improves with: rest, therapy/exercise and medication Relief from Meds: 6  Mobility walk without assistance how many minutes can you walk? 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  no  Physicians involved in your care Any changes since last visit?  no   Family History  Problem Relation Age of Onset  . Hypertension Mother   . Diabetes Father    History   Social History  . Marital Status: Single    Spouse Name: N/A    Number of Children: N/A  . Years of Education: N/A   Social History Main Topics  . Smoking status: Current Every Day Smoker -- 1.00 packs/day  . Smokeless tobacco: Never Used  . Alcohol Use: None  . Drug Use: None  . Sexual Activity: None   Other Topics Concern  . None   Social History Narrative  . None   Past Surgical History  Procedure Laterality Date  . Knee surgery     Past Medical History  Diagnosis Date  . Disorders of sacrum   . Displacement of lumbar intervertebral disc without myelopathy   . Thoracic or lumbosacral neuritis or radiculitis,  unspecified   . Facet syndrome, lumbar   . Lumbago    BP 149/96  Pulse 67  Resp 14  Wt 126.1 kg (278 lb)  SpO2 100%  Opioid Risk Score:   Fall Risk Score: Low Fall Risk (0-5 points) (previoulsy educated and given handout)  Review of Systems  Musculoskeletal: Positive for back pain.  All other systems reviewed and are negative.      Objective:   Physical Exam  Nursing note and vitals reviewed. Constitutional: He is oriented to person, place, and time. He appears well-developed and well-nourished.  HENT:  Head: Normocephalic and atraumatic.  Neck: Normal range of motion. Neck supple.  Cardiovascular: Normal rate and regular rhythm.   Pulmonary/Chest: Effort normal and breath sounds normal.  Musculoskeletal:  Normal Muscle Bulk and Muscle testing Reveals: Upper Extremities: Full ROM and Muscle strength 5/5 Back without Spinal or Paraspinal Tenderness Spinal Forward Flexion 80 Degrees and Extension 20 Degrees Lower Extremities: Full ROM and Muscle Strength 5/5 Arises from chair with ease Narrow Based Gait  Neurological: He is alert and oriented to person, place, and time.  Skin: Skin is warm and dry.  Psychiatric: He has a normal mood and affect.          Assessment & Plan:  1. Chronic low back pain: Continue with Exercise Regime  2.  Degenerative disk disease lumbar spine:  Refilled Methadone 10 mg take two tablets Twice a day #120 and Percocet 7.5/325 mg one tablet every 8 hours prn #90.  Second scripts given for the following month.   20 minutes of face to face patient care time was spent during this visit. All questions were encouraged and answered.   F/U in 2 Months

## 2014-02-14 ENCOUNTER — Encounter: Payer: Self-pay | Admitting: Registered Nurse

## 2014-02-14 ENCOUNTER — Encounter: Payer: BC Managed Care – PPO | Attending: Physical Medicine & Rehabilitation | Admitting: Registered Nurse

## 2014-02-14 VITALS — BP 142/50 | HR 61 | Resp 14 | Ht 68.0 in | Wt 276.0 lb

## 2014-02-14 DIAGNOSIS — M5116 Intervertebral disc disorders with radiculopathy, lumbar region: Secondary | ICD-10-CM | POA: Diagnosis present

## 2014-02-14 DIAGNOSIS — Z5181 Encounter for therapeutic drug level monitoring: Secondary | ICD-10-CM | POA: Diagnosis not present

## 2014-02-14 DIAGNOSIS — M545 Low back pain: Secondary | ICD-10-CM | POA: Diagnosis not present

## 2014-02-14 DIAGNOSIS — Z79899 Other long term (current) drug therapy: Secondary | ICD-10-CM | POA: Insufficient documentation

## 2014-02-14 MED ORDER — OXYCODONE-ACETAMINOPHEN 7.5-325 MG PO TABS: 1.0000 | ORAL_TABLET | Freq: Three times a day (TID) | ORAL | Status: DC | PRN

## 2014-02-14 MED ORDER — METHADONE HCL 10 MG PO TABS: 20.0000 mg | ORAL_TABLET | Freq: Two times a day (BID) | ORAL | Status: DC

## 2014-02-14 NOTE — Progress Notes (Signed)
Subjective:    Patient ID: Joshua [name], male    DOB: August 28, 1975, 38 y.o.   MRN: [id number]  HPI: [name] is a 38 year old male who returns for follow up for chronic pain and medication refill. He says his pain is in his lower back. He rates his pain 6. His current exercise regime is performing stretching and core exercises daily and walking. 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 Working 40 + hours at this time.  Pain Inventory Average Pain 5 Pain Right Now 6 My pain is constant, sharp and stabbing  In the last 24 hours, has pain interfered with the following? General activity 5 Relation with others 4 Enjoyment of life 6 What TIME of day is your pain at its worst? daytime, evening Sleep (in general) Fair  Pain is worse with: walking, bending, sitting and standing Pain improves with: rest, therapy/exercise and medication Relief from Meds: 5  Mobility walk without assistance how many minutes can you walk? 30 ability to climb steps?  yes do you drive?  yes  Function employed # of hrs/week 30- 40  Neuro/Psych No problems in this area  Prior Studies Any changes since last visit?  no  Physicians involved in your care Any changes since last visit?  no   Family History  Problem Relation Age of Onset  . Hypertension Mother   . Diabetes Father    History   Social History  . Marital Status: Single    Spouse Name: N/A    Number of Children: N/A  . Years of Education: N/A   Social History Main Topics  . Smoking status: Current Every Day Smoker -- 1.00 packs/day  . Smokeless tobacco: Never Used  . Alcohol Use: None  . Drug Use: None  . Sexual Activity: None   Other Topics Concern  . None   Social History Narrative   Past Surgical History  Procedure Laterality Date  . Knee surgery     Past Medical History  Diagnosis Date  . Disorders of sacrum   . Displacement of lumbar intervertebral disc without myelopathy   . Thoracic  or lumbosacral neuritis or radiculitis, unspecified   . Facet syndrome, lumbar   . Lumbago    BP 142/50 mmHg  Pulse 61  Resp 14  Ht 5\' 8"  (1.727 m)  Wt 276 lb (125.193 kg)  BMI 41.98 kg/m2  SpO2 98%  Opioid Risk Score:   Fall Risk Score: Low Fall Risk (0-5 points) (pt was given pamphlet during previous visit) Review of Systems  All other systems reviewed and are negative.      Objective:   Physical Exam  Constitutional: He is oriented to person, place, and time. He appears well-developed and well-nourished.  HENT:  Head: Normocephalic and atraumatic.  Neck: Normal range of motion. Neck supple.  Cardiovascular: Normal rate and regular rhythm.   Pulmonary/Chest: Effort normal and breath sounds normal.  Musculoskeletal:  Normal Muscle Bulk and Muscle Testing Reveals: Upper Extremities: Full ROM and Muscle Strength 5/5 Spinal Forward Flexion" 80 Degrees and Extension 20 Degrees Lumbar Paraspinal Tenderness: L-3- L-5 Lower extremities: Full ROM and Muscle Strength 5/5 Arises from chair with ease Narrow Based Gait  Neurological: He is alert and oriented to person, place, and time.  Skin: Skin is warm and dry.  Psychiatric: He has a normal mood and affect.  Nursing note and vitals reviewed.         Assessment &  Plan:  1. Chronic low back pain: Continue with Exercise Regime  2. Degenerative disk disease lumbar spine:  Refilled Methadone 10 mg take two tablets Twice a day #120 and Percocet 7.5/325 mg one tablet every 8 hours prn #90. Second scripts given for the following month.   20 minutes of face to face patient care time was spent during this visit. All questions were encouraged and answered.   F/U in 2 Months

## 2014-03-31 ENCOUNTER — Ambulatory Visit (INDEPENDENT_AMBULATORY_CARE_PROVIDER_SITE_OTHER): Payer: BLUE CROSS/BLUE SHIELD | Admitting: Family

## 2014-03-31 ENCOUNTER — Encounter: Payer: Self-pay | Admitting: Family

## 2014-03-31 VITALS — BP 135/79 | HR 77 | Temp 99.0°F | Ht 68.0 in | Wt 278.2 lb

## 2014-03-31 DIAGNOSIS — R05 Cough: Secondary | ICD-10-CM

## 2014-03-31 DIAGNOSIS — R0683 Snoring: Secondary | ICD-10-CM

## 2014-03-31 DIAGNOSIS — R059 Cough, unspecified: Secondary | ICD-10-CM

## 2014-03-31 DIAGNOSIS — J069 Acute upper respiratory infection, unspecified: Secondary | ICD-10-CM

## 2014-03-31 MED ORDER — AZITHROMYCIN 250 MG PO TABS: ORAL_TABLET | ORAL | Status: DC

## 2014-03-31 MED ORDER — BENZONATATE 200 MG PO CAPS: 200.0000 mg | ORAL_CAPSULE | Freq: Three times a day (TID) | ORAL | Status: DC | PRN

## 2014-03-31 NOTE — Progress Notes (Signed)
Subjective:    Patient ID: Joshua [name], male    DOB: Jul 15, 1975, 39 y.o.   MRN: [id number]  Cough This is a new problem. The current episode started yesterday. The problem has been gradually worsening. The problem occurs hourly. The cough is productive of purulent sputum. Associated symptoms include a sore throat and shortness of breath. Pertinent negatives include no chills, ear congestion, ear pain, fever, headaches, hemoptysis, myalgias, nasal congestion, postnasal drip, rhinorrhea or wheezing. The symptoms are aggravated by exercise. He has tried rest for the symptoms. The treatment provided mild relief. There is no history of asthma or COPD.   *Pt states his wife says he snores very loudly and at times it seems that he "stops breathing".    Review of Systems  Constitutional: Negative.  Negative for fever and chills.  HENT: Positive for sore throat. Negative for ear pain, postnasal drip and rhinorrhea.   Respiratory: Positive for cough and shortness of breath. Negative for hemoptysis and wheezing.   Cardiovascular: Negative.   Gastrointestinal: Negative.   Endocrine: Negative.   Genitourinary: Negative.   Musculoskeletal: Negative.  Negative for myalgias.  Neurological: Negative.  Negative for headaches.  Hematological: Negative.   Psychiatric/Behavioral: Negative.   All other systems reviewed and are negative.      Objective:   Physical Exam  Constitutional: He is oriented to person, place, and time. He appears well-developed and well-nourished. No distress.  HENT:  Head: Normocephalic.  Right Ear: External ear normal.  Left Ear: External ear normal.  Nasal passage erythemas with mild swelling    Eyes: Pupils are equal, round, and reactive to light. Right eye exhibits no discharge. Left eye exhibits no discharge.  Neck: Normal range of motion. Neck supple. No thyromegaly present.  Cardiovascular: Normal rate, regular rhythm, normal heart sounds and intact distal  pulses.   No murmur heard. Pulmonary/Chest: Effort normal and breath sounds normal. No respiratory distress. He has no wheezes.  Abdominal: Soft. Bowel sounds are normal. He exhibits no distension. There is no tenderness.  Musculoskeletal: Normal range of motion. He exhibits no edema or tenderness.  Neurological: He is alert and oriented to person, place, and time. He has normal reflexes. No cranial nerve deficit.  Skin: Skin is warm and dry. No rash noted. No erythema.  Psychiatric: He has a normal mood and affect. His behavior is normal. Judgment and thought content normal.  Vitals reviewed.     BP 135/79 mmHg  Pulse 77  Temp(Src) 99 F (37.2 C) (Oral)  Ht 5\' 8"  (1.727 m)  Wt 278 lb 3.2 oz (126.191 kg)  BMI 42.31 kg/m2     Assessment & Plan:  1. Acute upper respiratory infection -- Take meds as prescribed - Use a cool mist humidifier  -Use saline nose sprays frequently -Saline irrigations of the nose can be very helpful if done frequently.  * 4X daily for 1 week*  * Use of a nettie pot can be helpful with this. Follow directions with this* -Force fluids -For any cough or congestion  Use plain Mucinex- regular strength or max strength is fine   * Children- consult with Pharmacist for dosing -For fever or aces or pains- take tylenol or ibuprofen appropriate for age and weight.  * for fevers greater than 101 orally you may alternate ibuprofen and tylenol every  3 hours. -Throat lozenges if help - azithromycin (ZITHROMAX) 250 MG tablet; Take 500 mg once, then 250 mg for four days  Dispense: 6 tablet; Refill:  0  2. Cough - azithromycin (ZITHROMAX) 250 MG tablet; Take 500 mg once, then 250 mg for four days  Dispense: 6 tablet; Refill: 0 - benzonatate (TESSALON) 200 MG capsule; Take 1 capsule (200 mg total) by mouth 3 (three) times daily as needed.  Dispense: 30 capsule; Refill: 1  3. Snorin - Ambulatory referral to Sleep Studies  Jannifer Rodney, FNP

## 2014-03-31 NOTE — Patient Instructions (Addendum)
Upper Respiratory Infection, Adult An upper respiratory infection (URI) is also sometimes known as the common cold. The upper respiratory tract includes the nose, sinuses, throat, trachea, and bronchi. Bronchi are the airways leading to the lungs. Most people improve within 1 week, but symptoms can last up to 2 weeks. A residual cough may last even longer.  CAUSES Many different viruses can infect the tissues lining the upper respiratory tract. The tissues become irritated and inflamed and often become very moist. Mucus production is also common. A cold is contagious. You can easily spread the virus to others by oral contact. This includes kissing, sharing a glass, coughing, or sneezing. Touching your mouth or nose and then touching a surface, which is then touched by another person, can also spread the virus. SYMPTOMS  Symptoms typically develop 1 to 3 days after you come in contact with a cold virus. Symptoms vary from person to person. They may include:  Runny nose.  Sneezing.  Nasal congestion.  Sinus irritation.  Sore throat.  Loss of voice (laryngitis).  Cough.  Fatigue.  Muscle aches.  Loss of appetite.  Headache.  Low-grade fever. DIAGNOSIS  You might diagnose your own cold based on familiar symptoms, since most people get a cold 2 to 3 times a year. Your caregiver can confirm this based on your exam. Most importantly, your caregiver can check that your symptoms are not due to another disease such as strep throat, sinusitis, pneumonia, asthma, or epiglottitis. Blood tests, throat tests, and X-rays are not necessary to diagnose a common cold, but they may sometimes be helpful in excluding other more serious diseases. Your caregiver will decide if any further tests are required. RISKS AND COMPLICATIONS  You may be at risk for a more severe case of the common cold if you smoke cigarettes, have chronic heart disease (such as heart failure) or lung disease (such as asthma), or if  you have a weakened immune system. The very young and very old are also at risk for more serious infections. Bacterial sinusitis, middle ear infections, and bacterial pneumonia can complicate the common cold. The common cold can worsen asthma and chronic obstructive pulmonary disease (COPD). Sometimes, these complications can require emergency medical care and may be life-threatening. PREVENTION  The best way to protect against getting a cold is to practice good hygiene. Avoid oral or hand contact with people with cold symptoms. Wash your hands often if contact occurs. There is no clear evidence that vitamin C, vitamin E, echinacea, or exercise reduces the chance of developing a cold. However, it is always recommended to get plenty of rest and practice good nutrition. TREATMENT  Treatment is directed at relieving symptoms. There is no cure. Antibiotics are not effective, because the infection is caused by a virus, not by bacteria. Treatment may include:  Increased fluid intake. Sports drinks offer valuable electrolytes, sugars, and fluids.  Breathing heated mist or steam (vaporizer or shower).  Eating chicken soup or other clear broths, and maintaining good nutrition.  Getting plenty of rest.  Using gargles or lozenges for comfort.  Controlling fevers with ibuprofen or acetaminophen as directed by your caregiver.  Increasing usage of your inhaler if you have asthma. Zinc gel and zinc lozenges, taken in the first 24 hours of the common cold, can shorten the duration and lessen the severity of symptoms. Pain medicines may help with fever, muscle aches, and throat pain. A variety of non-prescription medicines are available to treat congestion and runny nose. Your caregiver   can make recommendations and may suggest nasal or lung inhalers for other symptoms.  HOME CARE INSTRUCTIONS   Only take over-the-counter or prescription medicines for pain, discomfort, or fever as directed by your  caregiver.  Use a warm mist humidifier or inhale steam from a shower to increase air moisture. This may keep secretions moist and make it easier to breathe.  Drink enough water and fluids to keep your urine clear or pale yellow.  Rest as needed.  Return to work when your temperature has returned to normal or as your caregiver advises. You may need to stay home longer to avoid infecting others. You can also use a face mask and careful hand washing to prevent spread of the virus. SEEK MEDICAL CARE IF:   After the first few days, you feel you are getting worse rather than better.  You need your caregiver's advice about medicines to control symptoms.  You develop chills, worsening shortness of breath, or brown or red sputum. These may be signs of pneumonia.  You develop yellow or brown nasal discharge or pain in the face, especially when you bend forward. These may be signs of sinusitis.  You develop a fever, swollen neck glands, pain with swallowing, or white areas in the back of your throat. These may be signs of strep throat. SEEK IMMEDIATE MEDICAL CARE IF:   You have a fever.  You develop severe or persistent headache, ear pain, sinus pain, or chest pain.  You develop wheezing, a prolonged cough, cough up blood, or have a change in your usual mucus (if you have chronic lung disease).  You develop sore muscles or a stiff neck. Document Released: 08/21/2000 Document Revised: 05/20/2011 Document Reviewed: 06/02/2013 Exit[employer] Patient Information 2015 [employer]. This information is not intended to replace advice given to you by your health care provider. Make sure you discuss any questions you have with your health care provider.  - Take meds as prescribed - Use a cool mist humidifier  -Use saline nose sprays frequently -Saline irrigations of the nose can be very helpful if done frequently.  * 4X daily for 1 week*  * Use of a nettie pot can be helpful with this. Follow  directions with this* -Force fluids -For any cough or congestion  Use plain Mucinex- regular strength or max strength is fine   * Children- consult with Pharmacist for dosing -For fever or aces or pains- take tylenol or ibuprofen appropriate for age and weight.  * for fevers greater than 101 orally you may alternate ibuprofen and tylenol every  3 hours. -Throat lozenges if help   Lakindra Wible, FNP   

## 2014-04-11 ENCOUNTER — Encounter: Payer: Self-pay | Admitting: Registered Nurse

## 2014-04-11 ENCOUNTER — Encounter: Payer: BLUE CROSS/BLUE SHIELD | Attending: Physical Medicine & Rehabilitation | Admitting: Registered Nurse

## 2014-04-11 VITALS — BP 132/64 | HR 64 | Resp 14

## 2014-04-11 DIAGNOSIS — M5116 Intervertebral disc disorders with radiculopathy, lumbar region: Secondary | ICD-10-CM

## 2014-04-11 DIAGNOSIS — M545 Low back pain: Secondary | ICD-10-CM

## 2014-04-11 DIAGNOSIS — Z79899 Other long term (current) drug therapy: Secondary | ICD-10-CM | POA: Diagnosis not present

## 2014-04-11 DIAGNOSIS — Z5181 Encounter for therapeutic drug level monitoring: Secondary | ICD-10-CM

## 2014-04-11 MED ORDER — OXYCODONE-ACETAMINOPHEN 7.5-325 MG PO TABS: 1.0000 | ORAL_TABLET | Freq: Three times a day (TID) | ORAL | Status: DC | PRN

## 2014-04-11 MED ORDER — METHADONE HCL 10 MG PO TABS: 20.0000 mg | ORAL_TABLET | Freq: Two times a day (BID) | ORAL | Status: DC

## 2014-04-11 NOTE — Progress Notes (Signed)
Subjective:    Patient ID: Joshua [name], male    DOB: February 21, 1976, 39 y.o.   MRN: [id number]  HPI: Joshua [name] is a 39 year old male who returns for follow up for chronic pain and medication refill. He says his pain is in his lower back. He rates his pain 6. His current exercise regime is performing stretching and core exercises daily and walking. 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   Pain Inventory Average Pain 5 Pain Right Now 6 My pain is sharp, stabbing and aching  In the last 24 hours, has pain interfered with the following? General activity 5 Relation with others 6 Enjoyment of life 6 What TIME of day is your pain at its worst? daytime, evening Sleep (in general) Fair  Pain is worse with: walking, bending, sitting, standing and some activites Pain improves with: rest, therapy/exercise and medication Relief from Meds: 6  Mobility walk without assistance how many minutes can you walk? 30 ability to climb steps?  yes do you drive?  yes transfers alone Do you have any goals in this area?  no  Function employed # of hrs/week 30-40 Do you have any goals in this area?  no  Neuro/Psych No problems in this area  Prior Studies Any changes since last visit?  no  Physicians involved in your care Any changes since last visit?  no   Family History  Problem Relation Age of Onset  . Hypertension Mother   . Diabetes Father    History   Social History  . Marital Status: Single    Spouse Name: N/A    Number of Children: N/A  . Years of Education: N/A   Social History Main Topics  . Smoking status: Former Smoker -- 1.00 packs/day    Quit date: 10/23/2013  . Smokeless tobacco: Never Used  . Alcohol Use: No  . Drug Use: No  . Sexual Activity: None   Other Topics Concern  . None   Social History Narrative   Past Surgical History  Procedure Laterality Date  . Knee surgery     Past Medical History  Diagnosis Date  . Disorders of  sacrum   . Displacement of lumbar intervertebral disc without myelopathy   . Thoracic or lumbosacral neuritis or radiculitis, unspecified   . Facet syndrome, lumbar   . Lumbago    BP 132/64 mmHg  Pulse 64  Resp 14  SpO2 97%  Opioid Risk Score:   Fall Risk Score: Low Fall Risk (0-5 points) Review of Systems  All other systems reviewed and are negative.      Objective:   Physical Exam  Constitutional: He is oriented to person, place, and time. He appears well-developed and well-nourished.  HENT:  Head: Normocephalic and atraumatic.  Neck: Normal range of motion. Neck supple.  Cardiovascular: Normal rate and regular rhythm.   Pulmonary/Chest: Effort normal and breath sounds normal.  Musculoskeletal:  Normal Muscle Bulk and Muscle Testing Reveals: Upper Extremities: Full ROM and Muscle strength 5/5 Lumbar Forward Flexion 45 Degrees and Extension 20 Degrees Lower extremities: Full ROM and Muscle strength 5/5 Arises from chair with ease Narrow Based Gait  Neurological: He is alert and oriented to person, place, and time.  Skin: Skin is warm and dry.  Psychiatric: He has a normal mood and affect.  Nursing note and vitals reviewed.         Assessment & Plan:  1. Chronic low back pain:  Continue with Exercise Regime  2. Degenerative disk disease lumbar spine:  Refilled Methadone 10 mg take two tablets Twice a day #120 and Percocet 7.5/325 mg one tablet every 8 hours prn #90. Second scripts given for the following month.   20 minutes of face to face patient care time was spent during this visit. All questions were encouraged and answered.   F/U in 2 Months

## 2014-06-10 ENCOUNTER — Encounter: Payer: BLUE CROSS/BLUE SHIELD | Attending: Physical Medicine & Rehabilitation | Admitting: Registered Nurse

## 2014-06-10 ENCOUNTER — Encounter: Payer: Self-pay | Admitting: Registered Nurse

## 2014-06-10 ENCOUNTER — Other Ambulatory Visit: Payer: Self-pay | Admitting: Registered Nurse

## 2014-06-10 VITALS — BP 122/58 | HR 60 | Resp 14

## 2014-06-10 DIAGNOSIS — G894 Chronic pain syndrome: Secondary | ICD-10-CM

## 2014-06-10 DIAGNOSIS — Z79899 Other long term (current) drug therapy: Secondary | ICD-10-CM | POA: Diagnosis not present

## 2014-06-10 DIAGNOSIS — M545 Low back pain: Secondary | ICD-10-CM | POA: Diagnosis present

## 2014-06-10 DIAGNOSIS — M5116 Intervertebral disc disorders with radiculopathy, lumbar region: Secondary | ICD-10-CM | POA: Diagnosis not present

## 2014-06-10 DIAGNOSIS — Z5181 Encounter for therapeutic drug level monitoring: Secondary | ICD-10-CM

## 2014-06-10 MED ORDER — OXYCODONE-ACETAMINOPHEN 7.5-325 MG PO TABS: 1.0000 | ORAL_TABLET | Freq: Three times a day (TID) | ORAL | Status: DC | PRN

## 2014-06-10 MED ORDER — METHADONE HCL 10 MG PO TABS: 20.0000 mg | ORAL_TABLET | Freq: Two times a day (BID) | ORAL | Status: DC

## 2014-06-10 NOTE — Progress Notes (Signed)
Subjective:    Patient ID: Joshua [name], male    DOB: [birth year], 39 y.o.   MRN: [id number]  HPI: [name] is a 39 year old male who returns for follow up for chronic pain and medication refill. He says his pain is in his lower back. He rates his pain 5. His current exercise regime is performing core exercises daily and walking. 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  Pain Inventory Average Pain 5 Pain Right Now 5 My pain is sharp and stabbing  In the last 24 hours, has pain interfered with the following? General activity 6 Relation with others 5 Enjoyment of life 6 What TIME of day is your pain at its worst? daytime, evening, night Sleep (in general) Fair  Pain is worse with: walking, bending, sitting, standing and some activites Pain improves with: rest, therapy/exercise and medication Relief from Meds: 5  Mobility Do you have any goals in this area?  no  Function employed # of hrs/week 30-40 Do you have any goals in this area?  no  Neuro/Psych No problems in this area  Prior Studies Any changes since last visit?  no  Physicians involved in your care Any changes since last visit?  no   Family History  Problem Relation Age of Onset  . Hypertension Mother   . Diabetes Father    History   Social History  . Marital Status: Single    Spouse Name: N/A  . Number of Children: N/A  . Years of Education: N/A   Social History Main Topics  . Smoking status: Former Smoker -- 1.00 packs/day    Quit date: 10/23/2013  . Smokeless tobacco: Never Used  . Alcohol Use: No  . Drug Use: No  . Sexual Activity: Not on file   Other Topics Concern  . None   Social History Narrative   Past Surgical History  Procedure Laterality Date  . Knee surgery     Past Medical History  Diagnosis Date  . Disorders of sacrum   . Displacement of lumbar intervertebral disc without myelopathy   . Thoracic or lumbosacral neuritis or radiculitis,  unspecified   . Facet syndrome, lumbar   . Lumbago    BP 122/58 mmHg  Pulse 60  Resp 14  SpO2 98%  Opioid Risk Score:   Fall Risk Score: Low Fall Risk (0-5 points) (patient previously educated)`1  Depression screen PHQ 2/9  Depression screen PHQ 2/9 06/10/2014  Decreased Interest 0  Down, Depressed, Hopeless 0  PHQ - 2 Score 0  Altered sleeping 0  Tired, decreased energy 0  Change in appetite 0  Feeling bad or failure about yourself  0  Trouble concentrating 0  Moving slowly or fidgety/restless 0  Suicidal thoughts 0  PHQ-9 Score 0     Review of Systems  All other systems reviewed and are negative.      Objective:   Physical Exam  Constitutional: He is oriented to person, place, and time. He appears well-developed and well-nourished.  HENT:  Head: Normocephalic and atraumatic.  Neck: Normal range of motion. Neck supple.  Cardiovascular: Normal rate and regular rhythm.   Pulmonary/Chest: Effort normal and breath sounds normal.  Musculoskeletal:  Normal Muscle Bulk and Muscle Testing Reveals: Upper Extremities: Full ROM and Muscle Strength 5/5 Lumbar Paraspinal Tenderness: L-3 _ L-5 Lower Extremities: Full ROM and Muscle strength 5/5 Arises from chair with ease Narrow Based gait  Neurological: He is alert  and oriented to person, place, and time.  Skin: Skin is warm and dry.  Psychiatric: He has a normal mood and affect.  Nursing note and vitals reviewed.         Assessment & Plan:  1. Chronic low back pain: Continue with Exercise Regime  2. Degenerative disk disease lumbar spine:  Refilled Methadone 10 mg take two tablets Twice a day #120 and Percocet 7.5/325 mg one tablet every 8 hours prn #90. Second scripts given for the following month.   20 minutes of face to face patient care time was spent during this visit. All questions were encouraged and answered.   F/U in 2 Months

## 2014-06-11 LAB — PMP ALCOHOL METABOLITE (ETG): Ethyl Glucuronide (EtG): NEGATIVE ng/mL

## 2014-06-13 ENCOUNTER — Encounter: Payer: Self-pay | Admitting: Internal Medicine

## 2014-06-13 ENCOUNTER — Ambulatory Visit (INDEPENDENT_AMBULATORY_CARE_PROVIDER_SITE_OTHER): Payer: BLUE CROSS/BLUE SHIELD | Admitting: Internal Medicine

## 2014-06-13 VITALS — BP 120/80 | HR 67 | Ht 68.0 in | Wt 275.0 lb

## 2014-06-13 DIAGNOSIS — G4733 Obstructive sleep apnea (adult) (pediatric): Secondary | ICD-10-CM

## 2014-06-13 DIAGNOSIS — Z720 Tobacco use: Secondary | ICD-10-CM | POA: Diagnosis not present

## 2014-06-13 NOTE — Progress Notes (Signed)
Subjective:    Patient ID: Joshua [name], male    DOB: 1975/08/29, 39 y.o.   MRN: [id number]  HPI 06/13/14- 38 yoM smokerReferred by Merry Proudristy Hawks. Snoring and possible apnea events. Epworth Score: 10  Wife here They report bad snoring. He falls asleep very easily if he is sitting quietly watching TV. He denies problems with sleepiness while driving. Bedtime between 9 and 10 PM, sleep latency 5 or 10 minutes, waking 3 or 4 times before up at 5 AM. 60 pound weight gain in the past 2 years.  No ENT surgery. Actively smoking 1 pack per day. No diagnosed heart or lung disease.  Prior to Admission medications   Medication Sig Start Date End Date Taking? Authorizing Provider  methadone (DOLOPHINE) 10 MG tablet Take 2 tablets (20 mg total) by mouth 2 (two) times daily. 06/10/14  Yes Jones BalesEunice L Thomas, NP  oxyCODONE-acetaminophen (PERCOCET) 7.5-325 MG per tablet Take 1 tablet by mouth every 8 (eight) hours as needed for pain. 06/10/14  Yes Jones BalesEunice L Thomas, NP   .phs Past Medical History  Diagnosis Date  . Disorders of sacrum   . Displacement of lumbar intervertebral disc without myelopathy   . Thoracic or lumbosacral neuritis or radiculitis, unspecified   . Facet syndrome, lumbar   . Lumbago    Family History  Problem Relation Age of Onset  . Hypertension Mother   . Diabetes Father    History   Social History  . Marital Status: Single    Spouse Name: N/A  . Number of Children: N/A  . Years of Education: N/A   Occupational History  . Dump Naval architectTruck Driver    Social History Main Topics  . Smoking status: Former Smoker -- 1.00 packs/day for 25 years    Quit date: 10/23/2013  . Smokeless tobacco: Never Used     Comment: Currently uses Vapor cig daily  . Alcohol Use: No  . Drug Use: No  . Sexual Activity: Not on file   Other Topics Concern  . Not on file   Social History Narrative    Review of Systems  Constitutional: Negative for fever and unexpected weight change.  HENT: Negative  for congestion, dental problem, ear pain, nosebleeds, postnasal drip, rhinorrhea, sinus pressure, sneezing, sore throat and trouble swallowing.   Eyes: Negative for redness and itching.  Respiratory: Negative for cough, chest tightness, shortness of breath and wheezing.   Cardiovascular: Negative for palpitations and leg swelling.  Gastrointestinal: Negative for nausea and vomiting.  Genitourinary: Negative for dysuria.  Musculoskeletal: Negative for joint swelling.  Skin: Negative for rash.  Neurological: Negative for headaches.  Hematological: Does not bruise/bleed easily.  Psychiatric/Behavioral: Negative for dysphoric mood. The patient is not nervous/anxious.        Objective:   Physical Exam OBJ- Physical Exam  +obese General- Alert, Oriented, Affect-appropriate, Distress- none acute Skin- rash-none, lesions- none, excoriation- none Lymphadenopathy- none Head- atraumatic            Eyes- Gross vision intact, PERRLA, conjunctivae and secretions clear            Ears- Hearing, canals-normal            Nose- Clear, no-Septal dev, mucus, polyps, erosion, perforation             Throat- Mallampati III , mucosa clear , drainage- none, tonsils- atrophic Neck- flexible , trachea midline, no stridor , thyroid nl, carotid no bruit Chest - symmetrical excursion , unlabored  Heart/CV- RRR , no murmur , no gallop  , no rub, nl s1 s2                           - JVD- none , edema- none, stasis changes- none, varices- none           Lung- clear to P&A, wheeze- none, cough- none , dullness-none, rub- none           Chest wall-  Abd-  Br/ Gen/ Rectal- Not done, not indicated Extrem- cyanosis- none, clubbing, none, atrophy- none, strength- nl Neuro- grossly intact to observation        Assessment & Plan:

## 2014-06-13 NOTE — Patient Instructions (Signed)
Order- NPSG split protocol    Dx OSA  I recommend you work on not smoking, before it makes you sick.

## 2014-06-13 NOTE — Assessment & Plan Note (Signed)
Counseled importance of smoking cessation

## 2014-06-13 NOTE — Assessment & Plan Note (Signed)
History and physical exam would be consistent with obstructive sleep apnea. We discussed medical concerns and evaluation. Plan-schedule polysomnogram

## 2014-06-16 LAB — OPIATES/OPIOIDS (LC/MS-MS)
Codeine Urine: NEGATIVE ng/mL (ref <50)
Hydrocodone: NEGATIVE ng/mL (ref <50)
Hydromorphone: NEGATIVE ng/mL (ref <50)
Morphine Urine: NEGATIVE ng/mL (ref <50)
Norhydrocodone, Ur: NEGATIVE ng/mL (ref <50)
Noroxycodone, Ur: 3127 ng/mL (ref <50)
Oxycodone, ur: 422 ng/mL (ref <50)
Oxymorphone: 1504 ng/mL (ref <50)

## 2014-06-16 LAB — OXYCODONE, URINE (LC/MS-MS)
Noroxycodone, Ur: 3127 ng/mL (ref <50)
Oxycodone, ur: 422 ng/mL (ref <50)
Oxymorphone: 1504 ng/mL (ref <50)

## 2014-06-16 LAB — METHADONE (GC/LC/MS), URINE
EDDP (GC/LC/MS), ur confirm: 3124 ng/mL (ref <100)
Methadone (GC/LC/MS), ur confirm: 2660 ng/mL (ref <100)

## 2014-06-17 LAB — PRESCRIPTION MONITORING PROFILE (SOLSTAS)
Amphetamine/Meth: NEGATIVE ng/mL
Barbiturate Screen, Urine: NEGATIVE ng/mL
Benzodiazepine Screen, Urine: NEGATIVE ng/mL
Buprenorphine, Urine: NEGATIVE ng/mL
Cannabinoid Scrn, Ur: NEGATIVE ng/mL
Carisoprodol, Urine: NEGATIVE ng/mL
Cocaine Metabolites: NEGATIVE ng/mL
Creatinine, Urine: 196.24 mg/dL (ref >20.0)
Fentanyl, Ur: NEGATIVE ng/mL
MDMA URINE: NEGATIVE ng/mL
Meperidine, Ur: NEGATIVE ng/mL
Nitrites, Initial: NEGATIVE ug/mL
Propoxyphene: NEGATIVE ng/mL
Tapentadol, urine: NEGATIVE ng/mL
Tramadol Scrn, Ur: NEGATIVE ng/mL
Zolpidem, Urine: NEGATIVE ng/mL
pH, Initial: 5.5 pH (ref 4.5–8.9)

## 2014-06-22 ENCOUNTER — Telehealth: Payer: Self-pay | Admitting: *Deleted

## 2014-06-22 NOTE — Telephone Encounter (Signed)
Pt called saying his medication was going to run out  Before his June 20th appt with Dr. Riley KillSwartz, I asked the pt if he would like to come in on June 1st. He agreed. His appt was changed to June 1st at 10:20 Am with Dr. Riley KillSwartz

## 2014-06-23 NOTE — Progress Notes (Signed)
Urine drug screen for this encounter is consistent for prescribed medication 

## 2014-07-04 ENCOUNTER — Ambulatory Visit (HOSPITAL_BASED_OUTPATIENT_CLINIC_OR_DEPARTMENT_OTHER): Payer: BLUE CROSS/BLUE SHIELD | Attending: Internal Medicine

## 2014-07-04 VITALS — Ht 68.0 in | Wt 275.0 lb

## 2014-07-04 DIAGNOSIS — R0683 Snoring: Secondary | ICD-10-CM | POA: Diagnosis not present

## 2014-07-04 DIAGNOSIS — G4733 Obstructive sleep apnea (adult) (pediatric): Secondary | ICD-10-CM | POA: Diagnosis not present

## 2014-07-04 DIAGNOSIS — G4710 Hypersomnia, unspecified: Secondary | ICD-10-CM | POA: Diagnosis present

## 2014-07-06 ENCOUNTER — Telehealth: Payer: Self-pay | Admitting: Internal Medicine

## 2014-07-06 NOTE — Telephone Encounter (Signed)
Pt had sleep study done on 07/04/14. Was advised by CY to follow up with him in 2 weeks after sleep study. Has ROV scheduled for June but wants to be seen before then.  CY - please advise where we can schedule this appointment.

## 2014-07-06 NOTE — Telephone Encounter (Signed)
Patient scheduled for follow up with Dr. Maple HudsonYoung on 07/19/14 at 1045 per KW.  Nothing further needed.

## 2014-07-06 NOTE — Telephone Encounter (Signed)
Joshua [name]- I never know how to schedule these people any longer. When can we get him back on routine schedule??

## 2014-07-06 NOTE — Telephone Encounter (Signed)
07-19-14 at 10:45am can be used

## 2014-07-09 DIAGNOSIS — G4733 Obstructive sleep apnea (adult) (pediatric): Secondary | ICD-10-CM | POA: Diagnosis not present

## 2014-07-09 NOTE — Sleep Study (Signed)
NAME: Joshua [name] DATE OF BIRTH:  Nov 26, 1975 MEDICAL RECORD NUMBER [id number]  LOCATION: [facility]  PHYSICIAN: Inas [address]ven[address]  DATE OF STUDY: 07/04/2014  SLEEP STUDY TYPE: Nocturnal Polysomnogram               REFERRING PHYSICIAN: Jetty Duhamel D, MD  INDICATION FOR STUDY: Hypersomnia with sleep apnea  EPWORTH SLEEPINESS SCORE:   HEIGHT: 5\' 8"  (172.7 cm)  WEIGHT: 124.739 kg (275 lb)    Body mass index is 41.82 kg/(m^2).  NECK SIZE: 20 in.  MEDICATIONS: Charted for review  SLEEP ARCHITECTURE: Split study protocol. During the diagnostic phase total sleep time 129 minutes with sleep efficiency 94.9%. Stage I was 10.5%, stage II 89.5%, stages 3 and REM were absent. Sleep latency 5 minutes, awake after sleep onset 2 minutes, arousal index 14, bedtime medication: None  RESPIRATORY DATA: Apnea hypopnea index (AHI) 34 per hour. 73 total events scored, all as hypopneas while supine. CPAP titrated to 12 CWP, AHI 2.6 per hour. He wore a medium nasal mask.  OXYGEN DATA: Before CPAP snoring was moderate to loud with oxygen desaturation to a nadir of 82% on room air. With CPAP control, snoring was prevented and mean oxygen saturation was 93.6%  CARDIAC DATA: Sinus rhythm with occasional PAC  MOVEMENT/PARASOMNIA: No significant movement disturbance, no bathroom trips  IMPRESSION/ RECOMMENDATION:   1) Severe obstructive sleep apnea/hypopnea syndrome, AHI 73 per hour. Events were mostly noted supine. Moderate to loud snoring with oxygen desaturation to a nadir of 82% 2) Successful CPAP titration to 12 CWP, AHI 2.6 per hour. He wore a medium [employer] nasal mask with heated humidifier.   Waymon Budge Diplomate, American Board of Sleep Medicine  ELECTRONICALLY SIGNED ON:  07/09/2014, 12:17 PM [facility] PH: [phone]   FX: [phone] ACCREDITED BY THE AMERICAN ACADEMY OF SLEEP MEDICINE

## 2014-07-19 ENCOUNTER — Encounter: Payer: Self-pay | Admitting: Internal Medicine

## 2014-07-19 ENCOUNTER — Ambulatory Visit (INDEPENDENT_AMBULATORY_CARE_PROVIDER_SITE_OTHER): Payer: BLUE CROSS/BLUE SHIELD | Admitting: Internal Medicine

## 2014-07-19 VITALS — BP 118/64 | HR 63 | Ht 68.0 in | Wt 273.0 lb

## 2014-07-19 DIAGNOSIS — G4733 Obstructive sleep apnea (adult) (pediatric): Secondary | ICD-10-CM

## 2014-07-19 DIAGNOSIS — Z720 Tobacco use: Secondary | ICD-10-CM

## 2014-07-19 NOTE — Assessment & Plan Note (Signed)
He is a substantial long-term risk for cardiopulmonary and vascular complications of tobacco use. Cessation recommended.

## 2014-07-19 NOTE — Assessment & Plan Note (Signed)
Interaction with obesity and tobacco use discussed. He should be a good candidate for CPAP. Pending weight loss, and oral appliances less likely to be effective. Plan-begin CPAP at 12 CWP based on titration

## 2014-07-19 NOTE — Progress Notes (Signed)
   Subjective:    Patient ID: Joshua DauerBrad D Wickersham, male    DOB: 1976-01-13, 39 y.o.   MRN: [id number]  HPI 06/13/14- 38 yoM smokerReferred by Merry Proudristy Hawks. Snoring and possible apnea events. Epworth Score: 10  Wife here They report bad snoring. He falls asleep very easily if he is sitting quietly watching TV. He denies problems with sleepiness while driving. Bedtime between 9 and 10 PM, sleep latency 5 or 10 minutes, waking 3 or 4 times before up at 5 AM. 60 pound weight gain in the past 2 years.  No ENT surgery. Actively smoking 1 pack per day. No diagnosed heart or lung disease.  07/19/14- 38 yoM smoker followed for obstructive sleep apnea  Wife here FOLLOWS FOR: Review sleep study with patient. Unfortunately he continues to smoke despite counseling. NPSG 07/04/14 severe obstructive sleep apnea, AHI 73 per hour with CPAP titration to 12 CWP. We discussed treatment and expected effect on medical and social effects of sleep apnea. Continue responsibility to drive safely, encouragement to stop smoking and to lose weight. He is willing to start CPAP as discussed.  Review of Systems  Constitutional: Negative for fever and unexpected weight change.  HENT: Negative for congestion, dental problem, ear pain, nosebleeds, postnasal drip, rhinorrhea, sinus pressure, sneezing, sore throat and trouble swallowing.   Eyes: Negative for redness and itching.  Respiratory: Negative for cough, chest tightness, shortness of breath and wheezing.   Cardiovascular: Negative for palpitations and leg swelling.  Gastrointestinal: Negative for nausea and vomiting.  Genitourinary: Negative for dysuria.  Musculoskeletal: Negative for joint swelling.  Skin: Negative for rash.  Neurological: Negative for headaches.  Hematological: Does not bruise/bleed easily.  Psychiatric/Behavioral: Negative for dysphoric mood. The patient is not nervous/anxious.      Objective:   Physical Exam OBJ- Physical Exam  +obese General- Alert,  Oriented, Affect-appropriate, Distress- none acute Skin- rash-none, lesions- none, excoriation- none Lymphadenopathy- none Head- atraumatic            Eyes- Gross vision intact, PERRLA, conjunctivae and secretions clear            Ears- Hearing, canals-normal            Nose- Clear, no-Septal dev, mucus, polyps, erosion, perforation             Throat- Mallampati III , mucosa clear , drainage- none, tonsils- atrophic Neck- flexible , trachea midline, no stridor , thyroid nl, carotid no bruit Chest - symmetrical excursion , unlabored           Heart/CV- RRR , no murmur , no gallop  , no rub, nl s1 s2                           - JVD- none , edema- none, stasis changes- none, varices- none           Lung- clear to P&A, wheeze- none, cough- none , dullness-none, rub- none           Chest wall-  Abd-  Br/ Gen/ Rectal- Not done, not indicated Extrem- cyanosis- none, clubbing, none, atrophy- none, strength- nl Neuro- grossly intact to observation        Assessment & Plan:

## 2014-07-19 NOTE — Patient Instructions (Addendum)
Order- New DME, New CPAP 12, mask of choice, humidifier, supplies, dx OSA  Please call as needed

## 2014-08-09 ENCOUNTER — Ambulatory Visit: Payer: BLUE CROSS/BLUE SHIELD | Admitting: Physical Medicine & Rehabilitation

## 2014-08-10 ENCOUNTER — Encounter: Payer: Self-pay | Admitting: Physical Medicine & Rehabilitation

## 2014-08-10 ENCOUNTER — Encounter
Payer: BLUE CROSS/BLUE SHIELD | Attending: Physical Medicine & Rehabilitation | Admitting: Physical Medicine & Rehabilitation

## 2014-08-10 VITALS — BP 150/76 | HR 76 | Resp 14

## 2014-08-10 DIAGNOSIS — Z5181 Encounter for therapeutic drug level monitoring: Secondary | ICD-10-CM | POA: Diagnosis present

## 2014-08-10 DIAGNOSIS — M545 Low back pain: Secondary | ICD-10-CM | POA: Insufficient documentation

## 2014-08-10 DIAGNOSIS — G4733 Obstructive sleep apnea (adult) (pediatric): Secondary | ICD-10-CM

## 2014-08-10 DIAGNOSIS — M5116 Intervertebral disc disorders with radiculopathy, lumbar region: Secondary | ICD-10-CM | POA: Insufficient documentation

## 2014-08-10 DIAGNOSIS — Z79899 Other long term (current) drug therapy: Secondary | ICD-10-CM | POA: Diagnosis present

## 2014-08-10 MED ORDER — OXYCODONE-ACETAMINOPHEN 7.5-325 MG PO TABS: 1.0000 | ORAL_TABLET | Freq: Three times a day (TID) | ORAL | Status: DC | PRN

## 2014-08-10 MED ORDER — METHADONE HCL 10 MG PO TABS: 20.0000 mg | ORAL_TABLET | Freq: Two times a day (BID) | ORAL | Status: DC

## 2014-08-10 NOTE — Progress Notes (Signed)
Subjective:    Patient ID: RISHABH RINKENBERGER, male    DOB: 08-02-1975, 39 y.o.   MRN: [id number]  HPI   Benyamin is back regarding his low back pain. His pain levels have been consistent. 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 His pain is primarily in his low back with some radiation down to the left knee.  His medications remain fairly effective.   He is doing "core exercises" at home. He does a lot of work around the house. He does mechanical work also.   Bowel and bladder function are normal. His mood has been good.   He was diagnosed with obstructive sleep apnea and is now using a CPAP machine. Lj states that the machine has really helped his sleep.   Pain Inventory Average Pain 5 Pain Right Now 6 My pain is constant, sharp, stabbing and aching  In the last 24 hours, has pain interfered with the following? General activity 4 Relation with others 5 Enjoyment of life 5 What TIME of day is your pain at its worst? VARIES on activity level Sleep (in general) Fair  Pain is worse with: walking, bending, sitting and standing Pain improves with: rest and medication Relief from Meds: 4  Mobility walk without assistance how many minutes can you walk? 15-20 ability to climb steps?  yes do you drive?  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  no  Physicians involved in your care Any changes since last visit?  no   Family History  Problem Relation Age of Onset  . Hypertension Mother   . Diabetes Father    History   Social History  . Marital Status: Single    Spouse Name: N/A  . Number of Children: N/A  . 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 Smoking status: Former Smoker -- 1.00 packs/day for 25 years    Quit date: 10/23/2013  . Smokeless tobacco: Current User   Comment: Currently uses Vapor cig daily  . Alcohol Use: No  . Drug Use: No  . Sexual Activity: Not on file   Other Topics Concern  . None   Social History Narrative   Past Surgical History  Procedure Laterality Date  . Knee surgery Left 2010   Past Medical History  Diagnosis Date  . Disorders of sacrum   . Displacement of lumbar intervertebral disc without myelopathy   . Thoracic or lumbosacral neuritis or radiculitis, unspecified   . Facet syndrome, lumbar   . Lumbago    BP 150/76 mmHg  Pulse 76  Resp 14  SpO2 99%  Opioid Risk Score:   Fall Risk Score: Low Fall Risk (0-5 points)`1  Depression screen PHQ 2/9  Depression screen PHQ 2/9 06/10/2014  Decreased Interest 0  Down, Depressed, Hopeless 0  PHQ - 2 Score 0  Altered sleeping 0  Tired, decreased energy 0  Change in appetite 0  Feeling bad or failure about yourself  0  Trouble concentrating 0  Moving slowly or fidgety/restless 0  Suicidal thoughts 0  PHQ-9 Score 0     Review of Systems  Constitutional: Negative.   HENT: Negative.   Eyes: Negative.   Respiratory: Negative.   Cardiovascular: Negative.   Gastrointestinal: Negative.   Endocrine: Negative.   Genitourinary: Negative.  Negative for genital sores.  Musculoskeletal: Positive for myalgias, back pain and arthralgias.       Pain radiating down into left leg  Skin: Negative.   Allergic/Immunologic: Negative.   Hematological: Negative.   Psychiatric/Behavioral: Negative.        Objective:   Physical Exam  Constitutional: He is oriented to person, place, and time. He appears well-developed and well-nourished. Obese  HENT:  Head: Normocephalic and atraumatic.  Eyes: Conjunctivae and EOM are normal. Pupils are equal, round, and reactive to light.  Neck: Normal range of motion. Neck supple.  Cardiovascular: Normal rate and regular rhythm.  Pulmonary/Chest: Effort normal and breath sounds normal.  Abdominal: Soft. Bowel sounds are normal.    Musculoskeletal:  Lumbar back: He exhibits decreased range of motion and tenderness. He exhibits no edema and no deformity. He is able to bend and touch his toes with some pain in the lower lumbar spine.  Cogntively he's alert an appropriate.  Neurological: He is alert and oriented to person, place, and time.    Assessment & Plan:   ASSESSMENT:  1. Chronic low back pain  2. Degenerative disk disease lumbar spine.   3. OSA  PLAN:  1. methadone 10 mg 2 b.i.d. 120 with second rx for next month  2. Percocet 7.5/325 one p.o. q.6 h. p.r.n. 90 with second rx for next month 3. My NP will see him in about 2 Months. 15 minutes of face to face patient care time were spent during this visit. All questions were encouraged and answered.    Pt remains compliant and consistent. Reminded Tristen that i want him to keep up with exercise and regular stretching---he needs to work on his flexibility still.

## 2014-08-10 NOTE — Patient Instructions (Signed)
PLEASE CALL ME WITH ANY PROBLEMS OR QUESTIONS (#[phone]).      

## 2014-08-18 ENCOUNTER — Encounter (HOSPITAL_BASED_OUTPATIENT_CLINIC_OR_DEPARTMENT_OTHER): Payer: BLUE CROSS/BLUE SHIELD

## 2014-08-29 ENCOUNTER — Ambulatory Visit: Payer: BLUE CROSS/BLUE SHIELD | Admitting: Physical Medicine & Rehabilitation

## 2014-09-01 ENCOUNTER — Ambulatory Visit: Payer: BLUE CROSS/BLUE SHIELD | Admitting: Internal Medicine

## 2014-09-02 ENCOUNTER — Ambulatory Visit: Payer: BLUE CROSS/BLUE SHIELD | Admitting: Internal Medicine

## 2014-09-05 ENCOUNTER — Ambulatory Visit: Payer: BLUE CROSS/BLUE SHIELD | Admitting: Internal Medicine

## 2014-10-10 ENCOUNTER — Encounter: Payer: Self-pay | Admitting: Registered Nurse

## 2014-10-10 ENCOUNTER — Encounter: Payer: BLUE CROSS/BLUE SHIELD | Attending: Physical Medicine & Rehabilitation | Admitting: Registered Nurse

## 2014-10-10 VITALS — BP 144/71 | HR 64 | Resp 14

## 2014-10-10 DIAGNOSIS — Z5181 Encounter for therapeutic drug level monitoring: Secondary | ICD-10-CM | POA: Insufficient documentation

## 2014-10-10 DIAGNOSIS — M5116 Intervertebral disc disorders with radiculopathy, lumbar region: Secondary | ICD-10-CM | POA: Insufficient documentation

## 2014-10-10 DIAGNOSIS — Z79899 Other long term (current) drug therapy: Secondary | ICD-10-CM

## 2014-10-10 DIAGNOSIS — G894 Chronic pain syndrome: Secondary | ICD-10-CM | POA: Diagnosis not present

## 2014-10-10 DIAGNOSIS — M545 Low back pain: Secondary | ICD-10-CM | POA: Insufficient documentation

## 2014-10-10 MED ORDER — METHADONE HCL 10 MG PO TABS
20.0000 mg | ORAL_TABLET | Freq: Two times a day (BID) | ORAL | Status: DC
Start: 2014-10-10 — End: 2014-10-10

## 2014-10-10 MED ORDER — OXYCODONE-ACETAMINOPHEN 7.5-325 MG PO TABS: 1.0000 | ORAL_TABLET | Freq: Three times a day (TID) | ORAL | Status: DC | PRN

## 2014-10-10 MED ORDER — METHADONE HCL 10 MG PO TABS
20.0000 mg | ORAL_TABLET | Freq: Two times a day (BID) | ORAL | Status: DC
Start: 2014-10-10 — End: 2014-12-09

## 2014-10-10 NOTE — Progress Notes (Signed)
Subjective:    Patient ID: Joshua [name], male    DOB: January 06, 1976, 39 y.o.   MRN: [id number]  HPI:Joshua [name] is a 39 year old male who returns for follow up for chronic pain and medication refill. He says his pain is in his lower back. He rates his pain 5. His current exercise regime is performing core exercises daily and walking. 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 Also states he's wearing his CPAP machine nightly.  Pain Inventory Average Pain 5 Pain Right Now 5 My pain is constant, sharp, stabbing and aching  In the last 24 hours, has pain interfered with the following? General activity 5 Relation with others 5 Enjoyment of life 6 What TIME of day is your pain at its worst? morning, daytime and evening Sleep (in general) Fair  Pain is worse with: walking, bending, sitting, standing and some activites Pain improves with: rest, therapy/exercise and medication Relief from Meds: 5  Mobility walk without assistance how many minutes can you walk? 20 ability to climb steps?  yes do you drive?  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  no  Physicians involved in your care Any changes since last visit?  no   Family History  Problem Relation Age of Onset  . Hypertension Mother   . Diabetes Father    History   Social History  . Marital Status: Single    Spouse Name: N/A  . Number of Children: N/A  . 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 Smoking status: Former Smoker -- 1.00 packs/day for 25 years    Quit date: 10/23/2013  . Smokeless tobacco: Current User     Comment: Currently uses Vapor cig daily  . Alcohol Use: No  . Drug Use: No  . Sexual Activity: Yes    Birth Control/ Protection: Condom   Other Topics Concern  . None   Social History  Narrative   Past Surgical History  Procedure Laterality Date  . Knee surgery Left 2010   Past Medical History  Diagnosis Date  . Disorders of sacrum   . Displacement of lumbar intervertebral disc without myelopathy   . Thoracic or lumbosacral neuritis or radiculitis, unspecified   . Facet syndrome, lumbar   . Lumbago    BP 144/71 mmHg  Pulse 64  Resp 14  SpO2 99%  Opioid Risk Score:   Fall Risk Score:  `1  Depression screen PHQ 2/9  Depression screen PHQ 2/9 06/10/2014  Decreased Interest 0  Down, Depressed, Hopeless 0  PHQ - 2 Score 0  Altered sleeping 0  Tired, decreased energy 0  Change in appetite 0  Feeling bad or failure about yourself  0  Trouble concentrating 0  Moving slowly or fidgety/restless 0  Suicidal thoughts 0  PHQ-9 Score 0      Review of Systems  Constitutional: Negative.   HENT: Negative.   Eyes: Negative.   Respiratory: Negative.   Cardiovascular: Negative.   Gastrointestinal: Negative.   Endocrine: Negative.   Genitourinary: Negative.   Musculoskeletal: Positive for myalgias, back pain and arthralgias.  Skin: Negative.   Allergic/Immunologic: Negative.   Neurological: Negative.   Hematological: Negative.   Psychiatric/Behavioral: Negative.        Objective:  Physical Exam  Constitutional: He is oriented to person, place, and time. He appears well-developed and well-nourished.  HENT:  Head: Normocephalic and atraumatic.  Neck: Normal range of motion. Neck supple.  Cardiovascular: Normal rate and regular rhythm.   Pulmonary/Chest: Effort normal and breath sounds normal.  Musculoskeletal:  Normal Muscle Bulk and Muscle Testing Reveals: Upper Extremities: Full ROM and Muscle Strength 5/5 Lumbar Paraspinal Tenderness: L-3- L-5 Lower Extremities: Full ROM and Muscle Strength 5/5 Arises from chair with ease Narrow Based Gait  Neurological: He is alert and oriented to person, place, and time.  Skin: Skin is warm and dry.    Psychiatric: He has a normal mood and affect.  Nursing note and vitals reviewed.         Assessment & Plan:  1. Chronic low back pain: Continue with Exercise Regime  2. Degenerative disk disease lumbar spine:  Refilled Methadone 10 mg take two tablets Twice a day #120 and Percocet 7.5/325 mg one tablet every 8 hours prn #90. Second scripts given for the following month.   20 minutes of face to face patient care time was spent during this visit. All questions were encouraged and answered.   F/U in 2 Months

## 2014-11-07 ENCOUNTER — Encounter: Payer: Self-pay | Admitting: Internal Medicine

## 2014-11-07 ENCOUNTER — Ambulatory Visit (INDEPENDENT_AMBULATORY_CARE_PROVIDER_SITE_OTHER): Payer: BLUE CROSS/BLUE SHIELD | Admitting: Internal Medicine

## 2014-11-07 VITALS — BP 118/70 | HR 69 | Ht 68.0 in | Wt 275.2 lb

## 2014-11-07 DIAGNOSIS — Z720 Tobacco use: Secondary | ICD-10-CM

## 2014-11-07 DIAGNOSIS — G4733 Obstructive sleep apnea (adult) (pediatric): Secondary | ICD-10-CM | POA: Diagnosis not present

## 2014-11-07 NOTE — Patient Instructions (Signed)
We can continue CPAP 12/ Advanced  Congratulations on leaving the cigarettes behind. Now please try to reduce the nicotine strength of your Vapes so you can get off them too.  Please call if we can help

## 2014-11-07 NOTE — Progress Notes (Signed)
Subjective:    Patient ID: Joshua [name], male    DOB: 11-05-1975, 39 y.o.   MRN: [id number]  HPI 06/13/14- 38 yoM smokerReferred by Merry Proud. Snoring and possible apnea events. Epworth Score: 10  Wife here They report bad snoring. He falls asleep very easily if he is sitting quietly watching TV. He denies problems with sleepiness while driving. Bedtime between 9 and 10 PM, sleep latency 5 or 10 minutes, waking 3 or 4 times before up at 5 AM. 60 pound weight gain in the past 2 years.  No ENT surgery. Actively smoking 1 pack per day. No diagnosed heart or lung disease.  07/19/14- 38 yoM smoker followed for obstructive sleep apnea  Wife here FOLLOWS FOR: Review sleep study with patient. Unfortunately he continues to smoke despite counseling. NPSG 07/04/14 severe obstructive sleep apnea, AHI 73 per hour with CPAP titration to 12 CWP. We discussed treatment and expected effect on medical and social effects of sleep apnea. Continue responsibility to drive safely, encouragement to stop smoking and to lose weight. He is willing to start CPAP as discussed.  11/07/14- 38 yoM smoker(VAPE) followed for obstructive sleep apnea  Wife here FOLLOWS FOR: Wears CPAP 12/Advanced/ every night for about 8 hours; pressure working well for patient. DME is [facility] attached to prior OV notes. Download confirms good compliance and control. He notices he sleeps much better with CPAP.  Review of Systems  Constitutional: Negative for fever and unexpected weight change.  HENT: Negative for congestion, dental problem, ear pain, nosebleeds, postnasal drip, rhinorrhea, sinus pressure, sneezing, sore throat and trouble swallowing.   Eyes: Negative for redness and itching.  Respiratory: Negative for cough, chest tightness, shortness of breath and wheezing.   Cardiovascular: Negative for palpitations and leg swelling.  Gastrointestinal: Negative for nausea and vomiting.  Genitourinary: Negative for dysuria.    Musculoskeletal: Negative for joint swelling.  Skin: Negative for rash.  Neurological: Negative for headaches.  Hematological: Does not bruise/bleed easily.  Psychiatric/Behavioral: Negative for dysphoric mood. The patient is not nervous/anxious.      Objective:   Physical Exam OBJ- Physical Exam  +obese General- Alert, Oriented, Affect-appropriate, Distress- none acute Skin- rash-none, lesions- none, excoriation- none Lymphadenopathy- none Head- atraumatic            Eyes- Gross vision intact, PERRLA, conjunctivae and secretions clear            Ears- Hearing, canals-normal            Nose- Clear, no-Septal dev, mucus, polyps, erosion, perforation             Throat- Mallampati III , mucosa clear , drainage- none, tonsils- atrophic Neck- flexible , trachea midline, no stridor , thyroid nl, carotid no bruit Chest - symmetrical excursion , unlabored           Heart/CV- RRR , no murmur , no gallop  , no rub, nl s1 s2                           - JVD- none , edema- none, stasis changes- none, varices- none           Lung- clear to P&A, wheeze- none, cough- none , dullness-none, rub- none           Chest wall-  Abd-  Br/ Gen/ Rectal- Not done, not indicated Extrem- cyanosis- none, clubbing, none, atrophy- none, strength- nl Neuro- grossly intact to observation  Assessment & Plan:

## 2014-11-20 NOTE — Assessment & Plan Note (Signed)
Download confirms good compliance and control with pressure 12. He definitely feels better with CPAP. Plan-continue current settings.

## 2014-11-20 NOTE — Assessment & Plan Note (Signed)
He has stopped smoking cigarettes and now needs to work on stopping his Vapes Plan-smoking cessation counseling. Use Vapes with low or 0 nicotine

## 2014-12-07 ENCOUNTER — Encounter: Payer: Self-pay | Admitting: Internal Medicine

## 2014-12-09 ENCOUNTER — Encounter: Payer: Self-pay | Admitting: Registered Nurse

## 2014-12-09 ENCOUNTER — Encounter: Payer: BLUE CROSS/BLUE SHIELD | Attending: Physical Medicine & Rehabilitation | Admitting: Registered Nurse

## 2014-12-09 VITALS — BP 135/66 | HR 68 | Resp 14

## 2014-12-09 DIAGNOSIS — Z79899 Other long term (current) drug therapy: Secondary | ICD-10-CM | POA: Diagnosis present

## 2014-12-09 DIAGNOSIS — Z5181 Encounter for therapeutic drug level monitoring: Secondary | ICD-10-CM

## 2014-12-09 DIAGNOSIS — M545 Low back pain: Secondary | ICD-10-CM | POA: Diagnosis present

## 2014-12-09 DIAGNOSIS — G894 Chronic pain syndrome: Secondary | ICD-10-CM | POA: Diagnosis not present

## 2014-12-09 DIAGNOSIS — M5116 Intervertebral disc disorders with radiculopathy, lumbar region: Secondary | ICD-10-CM | POA: Diagnosis present

## 2014-12-09 MED ORDER — OXYCODONE-ACETAMINOPHEN 7.5-325 MG PO TABS: 1.0000 | ORAL_TABLET | Freq: Three times a day (TID) | ORAL | Status: DC | PRN

## 2014-12-09 MED ORDER — METHADONE HCL 10 MG PO TABS: 20.0000 mg | ORAL_TABLET | Freq: Two times a day (BID) | ORAL | Status: DC

## 2014-12-09 NOTE — Progress Notes (Deleted)
   Subjective:    Patient ID: Joshua [name], male    DOB: 1975-06-22, 39 y.o.   MRN: [id number]  HPI   .[email or web address] Review of Systems     Objective:   Physical Exam        Assessment & Plan:

## 2014-12-09 NOTE — Progress Notes (Signed)
Subjective:    Patient ID: Joshua [name], male    DOB: 1975-10-31, 39 y.o.   MRN: [id number]  HPI: [name] is a 39 year old male who returns for follow up for chronic pain and medication refill. He says his pain is in his lower back. He rates his pain 6. His current exercise regime is performing core exercises daily and walking. 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  Pain Inventory Average Pain 5 Pain Right Now 6 My pain is sharp, stabbing, tingling and aching  In the last 24 hours, has pain interfered with the following? General activity 5 Relation with others 5 Enjoyment of life 5 What TIME of day is your pain at its worst? daytime, evening, night Sleep (in general) Fair  Pain is worse with: walking, bending, sitting and standing Pain improves with: rest, therapy/exercise and medication Relief from Meds: 6  Mobility walk without assistance how many minutes can you walk? 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  no  Physicians involved in your care Any changes since last visit?  no   Family History  Problem Relation Age of Onset  . Hypertension Mother   . Diabetes Father    Social History   Social History  . Marital Status: Single    Spouse Name: N/A  . Number of Children: N/A  . 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 Smoking status: Former Smoker -- 1.00 packs/day for 25 years    Quit date: 10/23/2013  . Smokeless tobacco: Current User     Comment: Currently uses Vapor cig daily  . Alcohol Use: No  . Drug Use: No  . Sexual Activity: Yes    Birth Control/ Protection: Condom   Other Topics Concern  . None   Social History Narrative   Past Surgical History  Procedure Laterality Date  . Knee surgery Left 2010   Past Medical History    Diagnosis Date  . Disorders of sacrum   . Displacement of lumbar intervertebral disc without myelopathy   . Thoracic or lumbosacral neuritis or radiculitis, unspecified   . Facet syndrome, lumbar   . Lumbago    BP 135/66 mmHg  Pulse 68  Resp 14  SpO2 98%  Opioid Risk Score:   Fall Risk Score:  `1  Depression screen PHQ 2/9  Depression screen PHQ 2/9 06/10/2014  Decreased Interest 0  Down, Depressed, Hopeless 0  PHQ - 2 Score 0  Altered sleeping 0  Tired, decreased energy 0  Change in appetite 0  Feeling bad or failure about yourself  0  Trouble concentrating 0  Moving slowly or fidgety/restless 0  Suicidal thoughts 0  PHQ-9 Score 0     Review of Systems  All other systems reviewed and are negative.      Objective:   Physical Exam  Constitutional: He is oriented to person, place, and time. He appears well-developed and well-nourished.  HENT:  Head: Normocephalic and atraumatic.  Neck: Normal range of motion. Neck supple.  Cardiovascular: Normal rate and regular rhythm.   Pulmonary/Chest: Effort normal and breath sounds normal.  Musculoskeletal:  Normal Muscle Bulk and Muscle Testing Reveals: Upper Extremities: Full ROM and Muscle Strength 5/5 Lumbar Paraspinal Tenderness: L-4- L-5 Lower Extremities:  Full ROM and Muscle Strength 5/5 Arises from chair with ease Narrow Based Gait  Neurological: He is alert and oriented to person, place, and time.  Skin: Skin is warm and dry.  Psychiatric: He has a normal mood and affect.  Nursing note and vitals reviewed.         Assessment & Plan:  1. Chronic low back pain: Continue with Exercise Regime  2. Degenerative disk disease lumbar spine:  Refilled Methadone 10 mg take two tablets Twice a day #120 and Percocet 7.5/325 mg one tablet every 8 hours prn #90. Second scripts given for the following month.   20 minutes of face to face patient care time was spent during this visit. All questions were encouraged and  answered.   F/U in 2 Months

## 2015-01-31 ENCOUNTER — Encounter: Payer: Self-pay | Admitting: Physical Medicine & Rehabilitation

## 2015-01-31 ENCOUNTER — Encounter
Payer: BLUE CROSS/BLUE SHIELD | Attending: Physical Medicine & Rehabilitation | Admitting: Physical Medicine & Rehabilitation

## 2015-01-31 VITALS — BP 125/51 | HR 73

## 2015-01-31 DIAGNOSIS — M5116 Intervertebral disc disorders with radiculopathy, lumbar region: Secondary | ICD-10-CM | POA: Insufficient documentation

## 2015-01-31 DIAGNOSIS — M545 Low back pain, unspecified: Secondary | ICD-10-CM

## 2015-01-31 DIAGNOSIS — Z5181 Encounter for therapeutic drug level monitoring: Secondary | ICD-10-CM | POA: Insufficient documentation

## 2015-01-31 DIAGNOSIS — Z79899 Other long term (current) drug therapy: Secondary | ICD-10-CM | POA: Insufficient documentation

## 2015-01-31 MED ORDER — OXYCODONE-ACETAMINOPHEN 7.5-325 MG PO TABS
1.0000 | ORAL_TABLET | Freq: Three times a day (TID) | ORAL | Status: DC | PRN
Start: 2015-01-31 — End: 2015-01-31

## 2015-01-31 MED ORDER — OXYCODONE-ACETAMINOPHEN 7.5-325 MG PO TABS: 1.0000 | ORAL_TABLET | Freq: Three times a day (TID) | ORAL | Status: DC | PRN

## 2015-01-31 MED ORDER — METHADONE HCL 10 MG PO TABS: 20.0000 mg | ORAL_TABLET | Freq: Two times a day (BID) | ORAL | Status: DC

## 2015-01-31 NOTE — Progress Notes (Signed)
Subjective:    Patient ID: Joshua [name], male    DOB: [birth year], 39 y.o.   MRN: [id number]  HPI  Joshua [name] is here regarding his low back pain. His pain levels have been pretty consistent. He still works ful time as a Naval architect. His meds remain effective. His pain is in the low back and posterior left thigh.   He reports no medical changes. Bowels are regular. Bladder habits are normal.    Pain Inventory Average Pain 5 Pain Right Now 6 My pain is sharp, stabbing and aching  In the last 24 hours, has pain interfered with the following? General activity 5 Relation with others 4 Enjoyment of life 5 What TIME of day is your pain at its worst? day, evening and night Sleep (in general) Fair  Pain is worse with: walking, bending, sitting, standing and some activites Pain improves with: rest, therapy/exercise and medication Relief from Meds: 6  Mobility how many minutes can you walk? 30 ability to climb steps?  yes do you drive?  yes transfers alone  Function employed # of hrs/week 30-40  Neuro/Psych No problems in this area  Prior Studies Any changes since last visit?  no  Physicians involved in your care Any changes since last visit?  no   Family History  Problem Relation Age of Onset  . Hypertension Mother   . Diabetes Father    Social History   Social History  . Marital Status: Single    Spouse Name: N/A  . Number of Children: N/A  . 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 Smoking status: Former Smoker -- 1.00 packs/day for 25 years    Quit date: 10/23/2013  . Smokeless tobacco: Current User     Comment: Currently uses Vapor cig daily  . Alcohol Use: No  . Drug Use: No  . Sexual Activity: Yes    Birth Control/ Protection: Condom   Other Topics Concern  . None   Social History Narrative   Past Surgical History  Procedure Laterality Date  . Knee surgery Left 2010   Past Medical  History  Diagnosis Date  . Disorders of sacrum   . Displacement of lumbar intervertebral disc without myelopathy   . Thoracic or lumbosacral neuritis or radiculitis, unspecified   . Facet syndrome, lumbar   . Lumbago    BP 125/51 mmHg  Pulse 73  SpO2 97%  Opioid Risk Score:   Fall Risk Score:  `1  Depression screen PHQ 2/9  Depression screen PHQ 2/9 06/10/2014  Decreased Interest 0  Down, Depressed, Hopeless 0  PHQ - 2 Score 0  Altered sleeping 0  Tired, decreased energy 0  Change in appetite 0  Feeling bad or failure about yourself  0  Trouble concentrating 0  Moving slowly or fidgety/restless 0  Suicidal thoughts 0  PHQ-9 Score 0      Review of Systems  Musculoskeletal: Positive for back pain.  All other systems reviewed and are negative.      Objective:   Physical Exam  Constitutional: He is oriented to person, place, and time. He appears well-developed and well-nourished. Obese  HENT:  Head: Normocephalic and atraumatic.  Eyes: Conjunctivae and EOM are normal. Pupils are equal, round, and reactive to light.  Neck: Normal range of motion. Neck supple.  Cardiovascular: Normal rate and regular rhythm.  Pulmonary/Chest: Effort normal and breath sounds normal.  Abdominal:  Soft. Bowel sounds are normal.  Musculoskeletal:  Lumbar back: He exhibits decreased range of motion and tenderness. He exhibits no edema and no deformity. He is able to bend and touch his toes with some pain in the lower lumbar spine.  Cogntively he's alert an appropriate.  Neurological: He is alert and oriented to person, place, and time.    Assessment & Plan:   ASSESSMENT:  1. Chronic low back pain  2. Degenerative disk disease lumbar spine.  3. OSA     PLAN:  1. methadone 10 mg 2 b.i.d. 120 with second rx for next month. He remains compliant with his meds.  2. Percocet 7.5/325 one p.o. q.6 h. p.r.n. 90 with second rx for next month  3. My NP will see him in about 2 Months. 15  minutes of face to face patient care time were spent during this visit. All questions were encouraged and answered. Needs to keep up with his stretching and aerobic exercises.

## 2015-01-31 NOTE — Patient Instructions (Signed)
PLEASE CALL ME WITH ANY PROBLEMS OR QUESTIONS (#[phone]). HAVE A HAPPY HOLIDAY SEASON!!!    

## 2015-03-31 ENCOUNTER — Encounter: Payer: Self-pay | Admitting: Registered Nurse

## 2015-03-31 ENCOUNTER — Encounter: Payer: BLUE CROSS/BLUE SHIELD | Attending: Physical Medicine & Rehabilitation | Admitting: Registered Nurse

## 2015-03-31 VITALS — BP 148/71 | HR 80 | Resp 16

## 2015-03-31 DIAGNOSIS — M5116 Intervertebral disc disorders with radiculopathy, lumbar region: Secondary | ICD-10-CM

## 2015-03-31 DIAGNOSIS — M545 Low back pain: Secondary | ICD-10-CM | POA: Diagnosis present

## 2015-03-31 DIAGNOSIS — G894 Chronic pain syndrome: Secondary | ICD-10-CM | POA: Diagnosis not present

## 2015-03-31 DIAGNOSIS — Z5181 Encounter for therapeutic drug level monitoring: Secondary | ICD-10-CM | POA: Diagnosis not present

## 2015-03-31 DIAGNOSIS — Z79899 Other long term (current) drug therapy: Secondary | ICD-10-CM | POA: Diagnosis not present

## 2015-03-31 MED ORDER — OXYCODONE-ACETAMINOPHEN 7.5-325 MG PO TABS: 1.0000 | ORAL_TABLET | Freq: Three times a day (TID) | ORAL | Status: DC | PRN

## 2015-03-31 MED ORDER — METHADONE HCL 10 MG PO TABS: 20.0000 mg | ORAL_TABLET | Freq: Two times a day (BID) | ORAL | Status: DC

## 2015-03-31 MED ORDER — METHADONE HCL 10 MG PO TABS
20.0000 mg | ORAL_TABLET | Freq: Two times a day (BID) | ORAL | Status: DC
Start: 2015-03-31 — End: 2015-03-31

## 2015-03-31 NOTE — Progress Notes (Signed)
Subjective:    Patient ID: Joshua [name], male    DOB: 05/30/1975, 40 y.o.   MRN: [id number]  HPI: [name] is a 40 year old male who returns for follow up for chronic pain and medication refill. He says his pain is in his lower back. He rates his pain 6. His current exercise regime is performing core exercises daily and walking. 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  Pain Inventory Average Pain 5 Pain Right Now 6 My pain is sharp, stabbing and aching  In the last 24 hours, has pain interfered with the following? General activity 5 Relation with others 5 Enjoyment of life 5 What TIME of day is your pain at its worst? daytime, evening, night Sleep (in general) Fair  Pain is worse with: walking, bending, sitting, standing and some activites Pain improves with: rest, therapy/exercise and medication Relief from Meds: 5  Mobility walk without assistance how many minutes can you walk? 30 ability to climb steps?  yes do you drive?  yes transfers alone Do you have any goals in this area?  no  Function employed # of hrs/week 30-40 Do you have any goals in this area?  no  Neuro/Psych No problems in this area  Prior Studies Any changes since last visit?  no  Physicians involved in your care Any changes since last visit?  no   Family History  Problem Relation Age of Onset  . Hypertension Mother   . Diabetes Father    Social History   Social History  . Marital Status: Single    Spouse Name: N/A  . Number of Children: N/A  . 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 Smoking status: Former Smoker -- 1.00 packs/day for 25 years    Quit date: 10/23/2013  . Smokeless tobacco: Current User     Comment: Currently uses Vapor cig daily  . Alcohol Use: No  . Drug Use: No  . Sexual Activity: Yes    Birth Control/ Protection: Condom   Other Topics Concern  . None   Social History  Narrative   Past Surgical History  Procedure Laterality Date  . Knee surgery Left 2010   Past Medical History  Diagnosis Date  . Disorders of sacrum   . Displacement of lumbar intervertebral disc without myelopathy   . Thoracic or lumbosacral neuritis or radiculitis, unspecified   . Facet syndrome, lumbar   . Lumbago    BP 148/71 mmHg  Pulse 80  Resp 16  SpO2 96%  Opioid Risk Score:   Fall Risk Score:  `1  Depression screen PHQ 2/9  Depression screen PHQ 2/9 06/10/2014  Decreased Interest 0  Down, Depressed, Hopeless 0  PHQ - 2 Score 0  Altered sleeping 0  Tired, decreased energy 0  Change in appetite 0  Feeling bad or failure about yourself  0  Trouble concentrating 0  Moving slowly or fidgety/restless 0  Suicidal thoughts 0  PHQ-9 Score 0     Review of Systems  All other systems reviewed and are negative.      Objective:   Physical Exam  Constitutional: He is oriented to person, place, and time. He appears well-developed and well-nourished.  HENT:  Head: Normocephalic and atraumatic.  Neck: Normal range of motion. Neck supple.  Cardiovascular: Normal rate and regular rhythm.   Pulmonary/Chest: Effort normal and breath  sounds normal.  Musculoskeletal:  Normal Muscle Bulk and Muscle Testing Reveals: Upper Extremities: Full ROM and Muscle Strength 5/5 Thoracic Paraspinal Tenderness: T-7- T-9 Lumbar Paraspinal Tenderness: L-3- L-5 Lower Extremities: Full ROM and Muscle Strength 5/5 Arises from chair with ease Narrow Based Gait  Neurological: He is alert and oriented to person, place, and time.  Skin: Skin is warm and dry.  Psychiatric: He has a normal mood and affect.  Nursing note and vitals reviewed.         Assessment & Plan:  1. Chronic low back pain: Continue with Exercise Regime  2. Degenerative disk disease lumbar spine:  Refilled Methadone 10 mg take two tablets Twice a day #120 and Percocet 7.5/325 mg one tablet every 8 hours prn  #90. Second scripts given for the following month.   20 minutes of face to face patient care time was spent during this visit. All questions were encouraged and answered.   F/U in 2 Months

## 2015-04-07 LAB — TOXASSURE SELECT,+ANTIDEPR,UR: PDF: 0

## 2015-04-07 NOTE — Progress Notes (Signed)
Urine drug screen for this encounter is consistent for prescribed medication 

## 2015-05-26 ENCOUNTER — Encounter: Payer: Self-pay | Admitting: Registered Nurse

## 2015-05-26 ENCOUNTER — Encounter: Payer: BLUE CROSS/BLUE SHIELD | Attending: Physical Medicine & Rehabilitation | Admitting: Registered Nurse

## 2015-05-26 VITALS — BP 145/65 | HR 70 | Resp 14

## 2015-05-26 DIAGNOSIS — M545 Low back pain: Secondary | ICD-10-CM | POA: Insufficient documentation

## 2015-05-26 DIAGNOSIS — G894 Chronic pain syndrome: Secondary | ICD-10-CM

## 2015-05-26 DIAGNOSIS — M5116 Intervertebral disc disorders with radiculopathy, lumbar region: Secondary | ICD-10-CM | POA: Insufficient documentation

## 2015-05-26 DIAGNOSIS — Z79899 Other long term (current) drug therapy: Secondary | ICD-10-CM | POA: Insufficient documentation

## 2015-05-26 DIAGNOSIS — Z5181 Encounter for therapeutic drug level monitoring: Secondary | ICD-10-CM | POA: Diagnosis not present

## 2015-05-26 MED ORDER — OXYCODONE-ACETAMINOPHEN 7.5-325 MG PO TABS: 1.0000 | ORAL_TABLET | Freq: Three times a day (TID) | ORAL | Status: DC | PRN

## 2015-05-26 MED ORDER — METHADONE HCL 10 MG PO TABS
20.0000 mg | ORAL_TABLET | Freq: Two times a day (BID) | ORAL | Status: DC
Start: 2015-05-26 — End: 2015-07-24

## 2015-05-26 MED ORDER — METHADONE HCL 10 MG PO TABS: 20.0000 mg | ORAL_TABLET | Freq: Two times a day (BID) | ORAL | Status: DC

## 2015-05-26 NOTE — Progress Notes (Signed)
Subjective:    Patient ID: Joshua [name], male    DOB: [birth year], 40 y.o.   MRN: [id number]  HPI: [name] is a 40 year old male who returns for follow up for chronic pain and medication refill. He states his pain is in his lower back. He rates his pain 5. His current exercise regime is performing core exercises daily and walking. 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   Pain Inventory Average Pain 4 Pain Right Now 5 My pain is sharp, stabbing and aching  In the last 24 hours, has pain interfered with the following? General activity 4 Relation with others 4 Enjoyment of life 4 What TIME of day is your pain at its worst? daytime, evening, night Sleep (in general) Fair  Pain is worse with: walking, bending, sitting and standing Pain improves with: rest, therapy/exercise and medication Relief from Meds: NA  Mobility walk without assistance how many minutes can you walk? 30 ability to climb steps?  yes do you drive?  yes  Function employed # of hrs/week 40  Neuro/Psych No problems in this area  Prior Studies Any changes since last visit?  no  Physicians involved in your care Any changes since last visit?  no   Family History  Problem Relation Age of Onset  . Hypertension Mother   . Diabetes Father    Social History   Social History  . Marital Status: Single    Spouse Name: N/A  . Number of Children: N/A  . Years of Education: N/A   Occupational History  . Dump Naval architectTruck Driver    Social History Main Topics  . Smoking status: Former Smoker -- 1.00 packs/day for 25 years    Quit date: 10/23/2013  . Smokeless tobacco: Current User     Comment: Currently uses Vapor cig daily  . Alcohol Use: No  . Drug Use: No  . Sexual Activity: Yes    Birth Control/ Protection: Condom   Other Topics Concern  . None   Social History Narrative   Past Surgical History  Procedure Laterality Date  . Knee surgery Left 2010   Past Medical  History  Diagnosis Date  . Disorders of sacrum   . Displacement of lumbar intervertebral disc without myelopathy   . Thoracic or lumbosacral neuritis or radiculitis, unspecified   . Facet syndrome, lumbar   . Lumbago    BP 145/65 mmHg  Pulse 70  Resp 14  SpO2 96%  Opioid Risk Score:   Fall Risk Score:  `1  Depression screen PHQ 2/9  Depression screen PHQ 2/9 06/10/2014  Decreased Interest 0  Down, Depressed, Hopeless 0  PHQ - 2 Score 0  Altered sleeping 0  Tired, decreased energy 0  Change in appetite 0  Feeling bad or failure about yourself  0  Trouble concentrating 0  Moving slowly or fidgety/restless 0  Suicidal thoughts 0  PHQ-9 Score 0     Review of Systems  All other systems reviewed and are negative.      Objective:   Physical Exam  Constitutional: He is oriented to person, place, and time. He appears well-developed and well-nourished.  HENT:  Head: Normocephalic and atraumatic.  Neck: Normal range of motion. Neck supple.  Cardiovascular: Normal rate and regular rhythm.   Pulmonary/Chest: Effort normal and breath sounds normal.  Musculoskeletal:  Normal Muscle Bulk and Muscle Testing Reveals: Upper Extremities: Full ROM and Muscle Strength 5/5 Lumbar Paraspinal Tenderness:  L-3-L-5 Lower Extremities: Full ROM and Muscle Strength 5/5 Arises from chair with ease Narrow Based Gait  Neurological: He is alert and oriented to person, place, and time.  Skin: Skin is warm and dry.  Psychiatric: He has a normal mood and affect.  Nursing note and vitals reviewed.         Assessment & Plan:  1. Chronic low back pain: Continue with Exercise Regime  2. Degenerative disk disease lumbar spine:  Refilled Methadone 10 mg take two tablets Twice a day #120 and Percocet 7.5/325 mg one tablet every 8 hours prn #90. Second scripts given for the following month.   20 minutes of face to face patient care time was spent during this visit. All questions were  encouraged and answered.   F/U in 2 Months

## 2015-07-24 ENCOUNTER — Encounter: Payer: Self-pay | Admitting: Registered Nurse

## 2015-07-24 ENCOUNTER — Encounter: Payer: BLUE CROSS/BLUE SHIELD | Attending: Physical Medicine & Rehabilitation | Admitting: Registered Nurse

## 2015-07-24 VITALS — BP 137/67 | HR 68 | Resp 16

## 2015-07-24 DIAGNOSIS — M545 Low back pain: Secondary | ICD-10-CM | POA: Diagnosis present

## 2015-07-24 DIAGNOSIS — Z5181 Encounter for therapeutic drug level monitoring: Secondary | ICD-10-CM | POA: Diagnosis not present

## 2015-07-24 DIAGNOSIS — Z79899 Other long term (current) drug therapy: Secondary | ICD-10-CM | POA: Diagnosis not present

## 2015-07-24 DIAGNOSIS — G894 Chronic pain syndrome: Secondary | ICD-10-CM

## 2015-07-24 DIAGNOSIS — M5116 Intervertebral disc disorders with radiculopathy, lumbar region: Secondary | ICD-10-CM

## 2015-07-24 MED ORDER — OXYCODONE-ACETAMINOPHEN 7.5-325 MG PO TABS: 1.0000 | ORAL_TABLET | Freq: Three times a day (TID) | ORAL | Status: DC | PRN

## 2015-07-24 MED ORDER — METHADONE HCL 10 MG PO TABS: 20.0000 mg | ORAL_TABLET | Freq: Two times a day (BID) | ORAL | Status: DC

## 2015-07-24 NOTE — Progress Notes (Signed)
Subjective:    Patient ID: Joshua [name], male    DOB: Feb 05, 1976, 40 y.o.   MRN: [id number]  HPI: [name] is a 40 year old male who returns for follow up for chronic pain and medication refill. He states his pain is in his lower back radiating into his left lower extremity posteriorly. He rates his pain 6. His current exercise regime is performing core exercises daily and walking. 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  Pain Inventory Average Pain 5 Pain Right Now 6 My pain is sharp, stabbing and aching  In the last 24 hours, has pain interfered with the following? General activity NA Relation with others NA Enjoyment of life NA What TIME of day is your pain at its worst? Daytime and Evening Sleep (in general) Fair  Pain is worse with: walking, bending, sitting, standing and some activites Pain improves with: rest, therapy/exercise and medication Relief from Meds: 5  Mobility walk without assistance how many minutes can you walk? 30 ability to climb steps?  yes do you drive?  yes transfers alone  Function employed # of hrs/week 30-40  Neuro/Psych No problems in this area  Prior Studies Any changes since last visit?  no  Physicians involved in your care Any changes since last visit?  no   Family History  Problem Relation Age of Onset  . Hypertension Mother   . Diabetes Father    Social History   Social History  . Marital Status: Joshua [name]    Spouse Name: N/A  . Number of Children: N/A  . Years of Education: N/A   Occupational History  . Dump Joshua [name]    Social History Main Topics  . Smoking status: Former Smoker -- 1.00 packs/day for 25 years    Quit date: 10/23/2013  . Smokeless tobacco: Current User     Comment: Currently uses Vapor cig daily  . Alcohol Use: No  . Drug Use: No  . Sexual Activity: Yes    Birth Control/ Protection: Condom   Other Topics Concern  . None   Social History Narrative   Past Surgical  History  Procedure Laterality Date  . Knee surgery Left 2010   Past Medical History  Diagnosis Date  . Disorders of sacrum   . Displacement of lumbar intervertebral disc without myelopathy   . Thoracic or lumbosacral neuritis or radiculitis, unspecified   . Facet syndrome, lumbar   . Lumbago    BP 137/67 mmHg  Pulse 68  Resp 16  SpO2 97%  Opioid Risk Score:   Fall Risk Score:  `1  Depression screen PHQ 2/9  Depression screen PHQ 2/9 06/10/2014  Decreased Interest 0  Down, Depressed, Hopeless 0  PHQ - 2 Score 0  Altered sleeping 0  Tired, decreased energy 0  Change in appetite 0  Feeling bad or failure about yourself  0  Trouble concentrating 0  Moving slowly or fidgety/restless 0  Suicidal thoughts 0  PHQ-9 Score 0      Review of Systems  All other systems reviewed and are negative.      Objective:   Physical Exam  Constitutional: He is oriented to person, place, and time. He appears well-developed and well-nourished.  HENT:  Head: Normocephalic and atraumatic.  Neck: Normal range of motion. Neck supple.  Cardiovascular: Normal rate and regular rhythm.   Pulmonary/Chest: Effort normal and breath sounds normal.  Musculoskeletal:  Normal Muscle Bulk and Muscle Testing Reveals:  Upper Extremities: Full ROM and Muscle Strength 5/5 Lumbar Paraspinal Tenderness: L-3-L-5 Lower  Extremities: Full ROM and Muscle Strength 5/5 Arises from chair with ease  Narrow Based Gait  Neurological: He is alert and oriented to person, place, and time.  Skin: Skin is warm and dry.  Psychiatric: He has a normal mood and affect.  Nursing note and vitals reviewed.         Assessment & Plan:  1. Chronic low back pain: Continue with Exercise Regime  2. Degenerative disk disease lumbar spine:  Refilled Methadone 10 mg take two tablets Twice a day #120 and Percocet 7.5/325 mg one tablet every 8 hours prn #90.Second scripts given for the following month.  We will continue the  opioid monitoring program, this consists of regular clinic visits, examinations, urine drug screen, pill counts as well as use of [place] Reporting [place].  20 minutes of face to face patient care time was spent during this visit. All questions were encouraged and answered.   F/U in 2 Months

## 2015-07-24 NOTE — Addendum Note (Signed)
Addended by: Angela NevinWESSLING, Ellias Mcelreath D on: 07/24/2015 02:10 PM   Modules accepted: Orders

## 2015-08-01 LAB — TOXASSURE SELECT,+ANTIDEPR,UR

## 2015-08-03 NOTE — Progress Notes (Signed)
Urine drug screen for this encounter is consistent for prescribed medications.   

## 2015-08-29 ENCOUNTER — Telehealth: Payer: Self-pay | Admitting: *Deleted

## 2015-08-29 NOTE — Telephone Encounter (Signed)
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 Riley KillSwartz explaining that he is okay to drive with the medications he is prescribed.

## 2015-08-30 NOTE — Telephone Encounter (Signed)
Letter written

## 2015-08-30 NOTE — Telephone Encounter (Signed)
Patient notified

## 2015-09-18 ENCOUNTER — Ambulatory Visit: Payer: BLUE CROSS/BLUE SHIELD | Admitting: Physical Medicine & Rehabilitation

## 2015-09-26 ENCOUNTER — Encounter
Payer: BLUE CROSS/BLUE SHIELD | Attending: Physical Medicine & Rehabilitation | Admitting: Physical Medicine & Rehabilitation

## 2015-09-26 ENCOUNTER — Encounter: Payer: Self-pay | Admitting: Physical Medicine & Rehabilitation

## 2015-09-26 VITALS — BP 122/79 | HR 60 | Resp 14

## 2015-09-26 DIAGNOSIS — Z5181 Encounter for therapeutic drug level monitoring: Secondary | ICD-10-CM | POA: Insufficient documentation

## 2015-09-26 DIAGNOSIS — M545 Low back pain: Secondary | ICD-10-CM | POA: Insufficient documentation

## 2015-09-26 DIAGNOSIS — M5116 Intervertebral disc disorders with radiculopathy, lumbar region: Secondary | ICD-10-CM | POA: Diagnosis not present

## 2015-09-26 DIAGNOSIS — Z79899 Other long term (current) drug therapy: Secondary | ICD-10-CM | POA: Diagnosis not present

## 2015-09-26 MED ORDER — OXYCODONE-ACETAMINOPHEN 7.5-325 MG PO TABS: 1.0000 | ORAL_TABLET | Freq: Three times a day (TID) | ORAL | Status: DC | PRN

## 2015-09-26 MED ORDER — METHADONE HCL 10 MG PO TABS: 20.0000 mg | ORAL_TABLET | Freq: Two times a day (BID) | ORAL | Status: DC

## 2015-09-26 NOTE — Progress Notes (Signed)
Subjective:    Patient ID: Joshua [name], male    DOB: 1975-08-31, 40 y.o.   MRN: [id number]  HPI   Joshua [name] is here in follow up of his chronic back pain. He continues to do fairly well from a pain standpoint with the medications we have on board. He has not tried to reduce his methadone and has not missed a dose. He's taking 20mg  bid with prn percocet for breakthrough. His back tends to bother him most when he's working/driving. He is able to sleep fairly well. His exercise regimen is not consistent.      Pain Inventory Average Pain 4 Pain Right Now 5 My pain is sharp, stabbing and aching  In the last 24 hours, has pain interfered with the following? General activity 5 Relation with others 4 Enjoyment of life 5 What TIME of day is your pain at its worst? morning Sleep (in general) Fair  Pain is worse with: walking, bending, sitting and standing Pain improves with: rest, therapy/exercise and medication Relief from Meds: 5  Mobility walk without assistance how many minutes can you walk? 50 ability to climb steps?  yes do you drive?  yes Do you have any goals in this area?  no  Function employed # of hrs/week 30-40 Do you have any goals in this area?  no  Neuro/Psych No problems in this area  Prior Studies Any changes since last visit?  no  Physicians involved in your care Any changes since last visit?  no   Family History  Problem Relation Age of Onset  . Hypertension Mother   . Diabetes Father    Social History   Social History  . Marital Status: Single    Spouse Name: N/A  . Number of Children: N/A  . Years of Education: N/A   Occupational History  . Dump Naval architectTruck Driver    Social History Main Topics  . Smoking status: Former Smoker -- 1.00 packs/day for 25 years    Quit date: 10/23/2013  . Smokeless tobacco: Current User     Comment: Currently uses Vapor cig daily  . Alcohol Use: No  . Drug Use: No  . Sexual Activity: Yes    Birth  Control/ Protection: Condom   Other Topics Concern  . None   Social History Narrative   Past Surgical History  Procedure Laterality Date  . Knee surgery Left 2010   Past Medical History  Diagnosis Date  . Disorders of sacrum   . Displacement of lumbar intervertebral disc without myelopathy   . Thoracic or lumbosacral neuritis or radiculitis, unspecified   . Facet syndrome, lumbar   . Lumbago    BP 122/79 mmHg  Pulse 60  Resp 14  SpO2 98%  Opioid Risk Score:   Fall Risk Score:  `1  Depression screen PHQ 2/9  Depression screen PHQ 2/9 06/10/2014  Decreased Interest 0  Down, Depressed, Hopeless 0  PHQ - 2 Score 0  Altered sleeping 0  Tired, decreased energy 0  Change in appetite 0  Feeling bad or failure about yourself  0  Trouble concentrating 0  Moving slowly or fidgety/restless 0  Suicidal thoughts 0  PHQ-9 Score 0     Review of Systems  Constitutional: Negative.   HENT: Negative.   Cardiovascular: Negative.   Gastrointestinal: Negative.   Endocrine: Negative.   Genitourinary: Negative.   Musculoskeletal: Positive for back pain.  Allergic/Immunologic: Negative.   Neurological: Negative.   Hematological: Negative.   Psychiatric/Behavioral:  Negative.   All other systems reviewed and are negative.      Objective:   Physical Exam  Constitutional: He is oriented to person, place, and time. He appears well-developed and well-nourished. Obese  HENT:  Head: Normocephalic and atraumatic.  Eyes: Conjunctivae and EOM are normal. Pupils are equal, round, and reactive to light.  Neck: Normal range of motion. Neck supple.  Cardiovascular: Normal rate and regular rhythm.  Pulmonary/Chest: Effort normal and breath sounds normal.  Abdominal: Soft. Bowel sounds are normal.  Musculoskeletal:  Lumbar back: He exhibits decreased range of motion and tenderness. He exhibits no edema and no deformity. He is able to bend and touch his toes with some pain in the lower  lumbar spine.  Cogntively he's alert an appropriate.  Neurological: He is alert and oriented to person, place, and time.    Assessment & Plan:   ASSESSMENT:  1. Chronic low back pain  2. Degenerative disk disease lumbar spine.  3. OSA    PLAN:  1. methadone 10 mg 2 b.i.d. 120 with second rx for next month. I described a modest taper with the goal of perhaps converting to another agent or removing the long acting narcs completely. 2. Percocet 7.5/325 one p.o. q.6 h. p.r.n. 90 with second rx for next month  3. My NP will see him in about 2 Months. 15 minutes of face to face patient care time were spent during this visit. All questions were encouraged and answered. Again he needs to keep up with his stretching and aerobic exercises.

## 2015-09-26 NOTE — Patient Instructions (Signed)
METHADONE WEEKS 1-2: TAKE 20MG  IN AM AND 15MG  IN PM WEEKS 3-4: TAKE 15MG  TWICE DAILY THEN>>>>----WEEK 7-8: 15MG  IN AND 10MG  IN PM  PLEASE CALL ME WITH ANY PROBLEMS OR QUESTIONS [phone])

## 2015-11-07 ENCOUNTER — Ambulatory Visit: Payer: BLUE CROSS/BLUE SHIELD | Admitting: Internal Medicine

## 2015-11-07 ENCOUNTER — Telehealth: Payer: Self-pay | Admitting: Family

## 2015-11-20 ENCOUNTER — Ambulatory Visit: Payer: BLUE CROSS/BLUE SHIELD | Admitting: Internal Medicine

## 2015-11-27 ENCOUNTER — Encounter
Payer: BLUE CROSS/BLUE SHIELD | Attending: Physical Medicine & Rehabilitation | Admitting: Physical Medicine & Rehabilitation

## 2015-11-27 ENCOUNTER — Encounter: Payer: Self-pay | Admitting: Physical Medicine & Rehabilitation

## 2015-11-27 VITALS — BP 134/74 | HR 80 | Resp 16

## 2015-11-27 DIAGNOSIS — M5116 Intervertebral disc disorders with radiculopathy, lumbar region: Secondary | ICD-10-CM | POA: Diagnosis not present

## 2015-11-27 DIAGNOSIS — G894 Chronic pain syndrome: Secondary | ICD-10-CM

## 2015-11-27 DIAGNOSIS — Z5181 Encounter for therapeutic drug level monitoring: Secondary | ICD-10-CM | POA: Diagnosis not present

## 2015-11-27 DIAGNOSIS — Z79899 Other long term (current) drug therapy: Secondary | ICD-10-CM

## 2015-11-27 DIAGNOSIS — M545 Low back pain: Secondary | ICD-10-CM | POA: Diagnosis not present

## 2015-11-27 MED ORDER — METHADONE HCL 10 MG PO TABS: 20.0000 mg | ORAL_TABLET | Freq: Two times a day (BID) | ORAL | 0 refills | Status: DC

## 2015-11-27 MED ORDER — OXYCODONE-ACETAMINOPHEN 7.5-325 MG PO TABS: 1.0000 | ORAL_TABLET | Freq: Three times a day (TID) | ORAL | 0 refills | Status: DC | PRN

## 2015-11-27 NOTE — Patient Instructions (Signed)
TRY TO DROP EITHER THE MORNING DOSE OR EVENING DOSE BY 5MG ---DON'T DROP ANY FURTHER.

## 2015-11-27 NOTE — Progress Notes (Signed)
Subjective:    Patient ID: Joshua [name], male    DOB: [birth year], 40 y.o.   MRN: [id number]  HPI   Joshua [name] is here in follow up of his chronic pain. He tried to drop his methadone and was able to reduce the evening dose to 15mg . When he dropped the morning dose his pain increased significanlty. He tried to drop to 15mg  bid a week after dropping the first time.   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   His bowels are moving without issue.    Pain Inventory Average Pain 5 Pain Right Now 6 My pain is sharp, stabbing and aching  In the last 24 hours, has pain interfered with the following? General activity 4 Relation with others 4 Enjoyment of life 5 What TIME of day is your pain at its worst? evening Sleep (in general) Fair  Pain is worse with: walking, bending, sitting, standing and some activites Pain improves with: rest, therapy/exercise and medication Relief from Meds: 5  Mobility how many minutes can you walk? 30 do you drive?  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  no  Physicians involved in your care Any changes since last visit?  no   Family History  Problem Relation Age of Onset  . Hypertension Mother   . Diabetes Father    Social History   Social History  . Marital status: Single    Spouse name: N/A  . Number of children: N/A  . 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 Smoking status: Former Smoker    Packs/day: 1.00    Years: 25.00    Quit date: 10/23/2013  . Smokeless tobacco: Current User     Comment: Currently uses Vapor cig daily  . Alcohol use No  . Drug use: No  . Sexual activity: Yes    Birth control/ protection: Condom   Other Topics Concern  . None   Social History Narrative  . None   Past  Surgical History:  Procedure Laterality Date  . KNEE SURGERY Left 2010   Past Medical History:  Diagnosis Date  . Disorders of sacrum   . Displacement of lumbar intervertebral disc without myelopathy   . Facet syndrome, lumbar   . Lumbago   . Thoracic or lumbosacral neuritis or radiculitis, unspecified    BP 134/74   Pulse 80   Resp 16   SpO2 97%   Opioid Risk Score:   Fall Risk Score:  `1  Depression screen PHQ 2/9  Depression screen [facility] 2/9 11/27/2015 06/10/2014  Decreased Interest 0 0  Down, Depressed, Hopeless 0 0  PHQ - 2 Score 0 0  Altered sleeping - 0  Tired, decreased energy - 0  Change in appetite - 0  Feeling bad or failure about yourself  - 0  Trouble concentrating - 0  Moving slowly or fidgety/restless - 0  Suicidal thoughts - 0  PHQ-9 Score - 0    Review of Systems  All other systems reviewed and are negative.      Objective:   Physical Exam  Constitutional: He is oriented to person, place, and time. He appears well-developed and well-nourished. Obese  HENT:  Head: Normocephalic and atraumatic.  Eyes: Conjunctivae and EOM  are normal. Pupils are equal, round, and reactive to light.  Neck: Normal range of motion. Neck supple.  Cardiovascular: Normal rate and regular rhythm.  Pulmonary/Chest: Effort normal and breath sounds normal.  Abdominal: Soft. Bowel sounds are normal.  Musculoskeletal:  Lumbar back: He exhibits decreased but stable range of motion and tenderness. He exhibits no edema and no deformity. He is able to bend and touch his toes with some pain in the lower lumbar spine.  Cogntively he's alert an appropriate.  Neurological: He is alert and oriented to person, place, and time.    Assessment & Plan:   ASSESSMENT:  1. Chronic low back pain  2. Degenerative disk disease lumbar spine.  3. OSA    PLAN:  1. methadone 10 mg 2 b.i.d. 120 with second rx for next month. ---goal will be to drop to 30mg  total daily. ---still will rx the  #120. He can either reduce the am dose by 5mg  or the pm dose 2. Percocet 7.5/325 one p.o. q.6 h. p.r.n. 90 with second rx for next month  3. My NP will see him in about 2 Months. 15 minutes of face to face patient care time were spent during this visit. All questions were encouraged and answered. Again he needs to keep up with his stretching and aerobic exercises.

## 2015-12-03 LAB — TOXASSURE SELECT,+ANTIDEPR,UR

## 2015-12-12 NOTE — Progress Notes (Signed)
Urine drug screen for this encounter is consistent for prescribed medication 

## 2016-01-24 ENCOUNTER — Encounter: Payer: BLUE CROSS/BLUE SHIELD | Attending: Physical Medicine & Rehabilitation | Admitting: Registered Nurse

## 2016-01-24 ENCOUNTER — Encounter: Payer: Self-pay | Admitting: Registered Nurse

## 2016-01-24 VITALS — BP 149/84 | HR 71 | Resp 14

## 2016-01-24 DIAGNOSIS — Z79899 Other long term (current) drug therapy: Secondary | ICD-10-CM

## 2016-01-24 DIAGNOSIS — Z5181 Encounter for therapeutic drug level monitoring: Secondary | ICD-10-CM | POA: Insufficient documentation

## 2016-01-24 DIAGNOSIS — M5116 Intervertebral disc disorders with radiculopathy, lumbar region: Secondary | ICD-10-CM | POA: Diagnosis not present

## 2016-01-24 DIAGNOSIS — G894 Chronic pain syndrome: Secondary | ICD-10-CM | POA: Diagnosis not present

## 2016-01-24 DIAGNOSIS — M545 Low back pain: Secondary | ICD-10-CM | POA: Insufficient documentation

## 2016-01-24 MED ORDER — OXYCODONE-ACETAMINOPHEN 7.5-325 MG PO TABS: 1.0000 | ORAL_TABLET | Freq: Three times a day (TID) | ORAL | 0 refills | Status: DC | PRN

## 2016-01-24 MED ORDER — METHADONE HCL 10 MG PO TABS: ORAL_TABLET | ORAL | 0 refills | Status: DC

## 2016-01-24 NOTE — Progress Notes (Signed)
Subjective:    Patient ID: Joshua [name], male    DOB: Sep 29, 1975, 40 y.o.   MRN: [id number]  HPI: Joshua [name] is a 40 year old male who returns for follow up for chronic pain and medication refill. He states his pain is in his lower back radiating into his left lower extremity posteriorly. He rates his pain 5. His current exercise regime is performing core exercises daily and walking. 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  Joshua [name] has tolerated the weaning of his methadone he's currently on Methadone 20 mg in the morning and 10 mg in the evening.  Dr. Riley KillSwartz note reviewed. Awaiting on Dr. Riley KillSwartz input regarding the continue weaning of Methadone. Joshua [name] will be called with further instructions he verbalizes understanding.   Pain Inventory Average Pain 5 Pain Right Now 5 My pain is sharp, stabbing and aching  In the last 24 hours, has pain interfered with the following? General activity 5 Relation with others 4 Enjoyment of life 5 What TIME of day is your pain at its worst? daytime Sleep (in general) NA  Pain is worse with: walking, sitting, standing and some activites Pain improves with: rest, therapy/exercise and medication Relief from Meds: 5  Mobility walk without assistance how many minutes can you walk? 30 ability to climb steps?  yes do you drive?  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  no  Physicians involved in your care Any changes since last visit?  no   Family History  Problem Relation Age of Onset  . Hypertension Mother   . Diabetes Father    Social History   Social History  . Marital status: Single    Spouse name: N/A  . Number of children: N/A  . Years of education: N/A   Occupational History  . Dump Naval architectTruck Driver    Social History Main Topics  . Smoking status: Former Smoker   Packs/day: 1.00    Years: 25.00    Quit date: 10/23/2013  . Smokeless tobacco: Current User     Comment: Currently uses Vapor cig daily  . Alcohol use No  . Drug use: No  . Sexual activity: Yes    Birth control/ protection: Condom   Other Topics Concern  . None   Social History Narrative  . None   Past Surgical History:  Procedure Laterality Date  . KNEE SURGERY Left 2010   Past Medical History:  Diagnosis Date  . Disorders of sacrum   . Displacement of lumbar intervertebral disc without myelopathy   . Facet syndrome, lumbar   . Lumbago   . Thoracic or lumbosacral neuritis or radiculitis, unspecified    BP (!) 149/84   Pulse 71   Resp 14   SpO2 98%   Opioid Risk Score:   Fall Risk Score:  `1  Depression screen PHQ 2/9  [facility] 01/24/2016 11/27/2015 06/10/2014  Decreased Interest 0 0 0  Down, Depressed, Hopeless 0 0 0  PHQ - 2 Score 0 0 0  Altered sleeping - - 0  Tired, decreased energy - - 0  Change in appetite - - 0  Feeling bad or failure about yourself  - - 0  Trouble concentrating - - 0  Moving slowly or fidgety/restless - - 0  Suicidal thoughts - - 0  PHQ-9 Score - - 0    Review of Systems  Constitutional: Negative.   HENT: Negative.   Eyes: Negative.   Respiratory: Negative.   Cardiovascular: Negative.   Gastrointestinal: Negative.   Endocrine: Negative.   Genitourinary: Negative.   Musculoskeletal: Negative.   Skin: Negative.   Allergic/Immunologic: Negative.   Neurological: Negative.   Hematological: Negative.   Psychiatric/Behavioral: Negative.   All other systems reviewed and are negative.      Objective:   Physical Exam  Constitutional: He is oriented to person, place, and time. He appears well-developed and well-nourished.  HENT:  Head: Normocephalic and atraumatic.  Neck: Normal range of motion. Neck supple.  Cardiovascular: Normal rate and regular rhythm.   Pulmonary/Chest: Effort normal and breath sounds  normal.  Musculoskeletal:  Normal Muscle Bulk and Muscle Testing Reveals: Upper Extremities: Full ROM and Muscle Strength 5/5 Lumbar Paraspinal Tenderness: L-4-L-5 Lower Extremities: Full ROM and Muscle Strength 5/5 Arises from Table with ease Narrow Based Gait     Neurological: He is alert and oriented to person, place, and time.  Skin: Skin is warm and dry.  Psychiatric: He has a normal mood and affect.  Nursing note and vitals reviewed.         Assessment & Plan:  1. Chronic low back pain: Continue with Exercise Regime  2. Degenerative disk disease lumbar spine:  Refilled Methadone 10 mg take two tablets in the morning and one tablet in the evening #90 and  Percocet 7.5/325 mg one tablet every 8 hours prn #90.Second scripts given for the following month.  We will continue the opioid monitoring program, this consists of regular clinic visits, examinations, urine drug screen, pill counts as well as use of [place] Cont[place] Substance Reporting [place].  20 minutes of face to face patient care time was spent during this visit. All questions were encouraged and answered.   F/U in 2 Months

## 2016-01-24 NOTE — Patient Instructions (Addendum)
If Dr. Mingo AmberSwartz  Agrees with plan : We will continue the Weaning of Methadone: Such as  Methadone to 1 1/2 tablet ( 15 mg) , every other day with a goal to daily. Joshua [name] is aware to continue current medication regime until I call him, he verbalizes understanding.    Call office with any questions [phone]

## 2016-01-25 ENCOUNTER — Telehealth: Payer: Self-pay | Admitting: Registered Nurse

## 2016-01-25 NOTE — Telephone Encounter (Signed)
Placed a call to [name], Dr. Riley KillSwartz is in agreement with the continual weaning of Methadone with a goal of 10 mg BID. He verbalizes understanding. Instructed to call with any questions he verbalizes understanding.

## 2016-02-21 ENCOUNTER — Telehealth: Payer: Self-pay

## 2016-02-21 NOTE — Telephone Encounter (Signed)
Patient called this am stating he cut back on taking his methadone to 2 1/2 for one week now and 1 Oxycodone (percocet) every 6-8 hours. Patient stated "it is not working out, and that pain is very unbearable"  Patient would like a call back. Please advise

## 2016-02-21 NOTE — Telephone Encounter (Signed)
Return [name] call, he states the titration of methadone and increase of Oxycodone is not controlling pain.  He's currently taking Methadone 10 mg in am and 15 mg ( 1 1/2) in the evening.   For the last three days he has increased his Oxycodone to every 6 hours, we discussed this regimen last visit.  Current Plan: Take Methadone 15 mg in the morning and 10 mg in the evening.  Oxycodone will be increased to Q 6 hours, He was instructed to follow this regimen for a week, call office for medication evaluation.   If he is unable to tolerate the above plan we will resume   Methadone 20 mg in am and 10 mg in the evening.   He was instructed to call office with any concerns and questions. He verbalizes understanding.

## 2016-03-07 ENCOUNTER — Telehealth: Payer: Self-pay | Admitting: Registered Nurse

## 2016-03-07 NOTE — Telephone Encounter (Signed)
Ptn called  And needs Jeanann Lewandowskyunice T - to return call - no reason stated in voicemail

## 2016-03-07 NOTE — Telephone Encounter (Signed)
Return [name] call no answer, left message return the call.

## 2016-03-12 ENCOUNTER — Telehealth: Payer: Self-pay | Admitting: *Deleted

## 2016-03-12 NOTE — Telephone Encounter (Signed)
Nida BoatmanBrad is requesting a call back from CrucibleEunice.

## 2016-03-12 NOTE — Telephone Encounter (Signed)
Return Joshua [name] call, he states he has resume the Methadone 20 mg in the morning and 10 mg in the evening. Oxycodone every 8 hours. States he wasn't able to tolerate the weaning due to increase intensity of pain. Resumed analgesics as prescribed, has a F/U appointment on 03/22/2016. He verbalizes understanding.

## 2016-03-22 ENCOUNTER — Encounter: Payer: Self-pay | Admitting: Registered Nurse

## 2016-03-22 ENCOUNTER — Encounter: Payer: BLUE CROSS/BLUE SHIELD | Attending: Physical Medicine & Rehabilitation | Admitting: Registered Nurse

## 2016-03-22 VITALS — BP 136/84 | HR 67 | Resp 14

## 2016-03-22 DIAGNOSIS — Z5181 Encounter for therapeutic drug level monitoring: Secondary | ICD-10-CM | POA: Insufficient documentation

## 2016-03-22 DIAGNOSIS — G894 Chronic pain syndrome: Secondary | ICD-10-CM | POA: Diagnosis not present

## 2016-03-22 DIAGNOSIS — Z79899 Other long term (current) drug therapy: Secondary | ICD-10-CM | POA: Insufficient documentation

## 2016-03-22 DIAGNOSIS — M545 Low back pain: Secondary | ICD-10-CM | POA: Diagnosis not present

## 2016-03-22 DIAGNOSIS — M5116 Intervertebral disc disorders with radiculopathy, lumbar region: Secondary | ICD-10-CM | POA: Insufficient documentation

## 2016-03-22 MED ORDER — OXYCODONE-ACETAMINOPHEN 7.5-325 MG PO TABS: 1.0000 | ORAL_TABLET | Freq: Three times a day (TID) | ORAL | 0 refills | Status: DC | PRN

## 2016-03-22 MED ORDER — METHADONE HCL 10 MG PO TABS
ORAL_TABLET | ORAL | 0 refills | Status: DC
Start: 2016-03-22 — End: 2016-05-20

## 2016-03-22 MED ORDER — METHADONE HCL 10 MG PO TABS: ORAL_TABLET | ORAL | 0 refills | Status: DC

## 2016-03-22 NOTE — Progress Notes (Signed)
Subjective:    Patient ID: Joshua [name], male    DOB: [birth year], 41 y.o.   MRN: [id number]  HPI: Joshua [name] is a 41 year old male who returns for follow up appointment for chronic pain and medication refill. He states his pain is in his lower back radiating into his lower extremities posteriorly with long periods of sitting and standing. He rates his pain 6. His current exercise regime is performing core exercises daily and walking. 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  Pain Inventory Average Pain 5 Pain Right Now 6 My pain is sharp, stabbing and aching  In the last 24 hours, has pain interfered with the following? General activity 5 Relation with others 5 Enjoyment of life 5 What TIME of day is your pain at its worst? Daytime, evening Sleep (in general) Fair  Pain is worse with: walking, bending, sitting, standing and some activites Pain improves with: rest, therapy/exercise and medication Relief from Meds: 4  Mobility how many minutes can you walk? 30 ability to climb steps?  yes do you drive?  yes transfers alone  Function not employed: date last employed 40  Neuro/Psych No problems in this area  Prior Studies Any changes since last visit?  no  Physicians involved in your care Any changes since last visit?  no   Family History  Problem Relation Age of Onset  . Hypertension Mother   . Diabetes Father    Social History   Social History  . Marital status: Single    Spouse name: N/A  . Number of children: N/A  . Years of education: N/A   Occupational History  . Dump Naval architectTruck Driver    Social History Main Topics  . Smoking status: Former Smoker    Packs/day: 1.00    Years: 25.00    Quit date: 10/23/2013  . Smokeless tobacco: Current User     Comment: Currently uses Vapor cig daily  . Alcohol use No  . Drug use: No  . Sexual activity: Yes    Birth control/ protection: Condom   Other Topics Concern  . None   Social  History Narrative  . None   Past Surgical History:  Procedure Laterality Date  . KNEE SURGERY Left 2010   Past Medical History:  Diagnosis Date  . Disorders of sacrum   . Displacement of lumbar intervertebral disc without myelopathy   . Facet syndrome, lumbar   . Lumbago   . Thoracic or lumbosacral neuritis or radiculitis, unspecified    BP 136/84   Pulse 67   Resp 14   SpO2 97%   Opioid Risk Score:   Fall Risk Score:  `1  Depression screen PHQ 2/9  [facility] 2/[facility] 01/24/2016 11/27/2015 06/10/2014  Decreased Interest 0 0 0  Down, Depressed, Hopeless 0 0 0  PHQ - 2 Score 0 0 0  Altered sleeping - - 0  Tired, decreased energy - - 0  Change in appetite - - 0  Feeling bad or failure about yourself  - - 0  Trouble concentrating - - 0  Moving slowly or fidgety/restless - - 0  Suicidal thoughts - - 0  PHQ-9 Score - - 0    Review of Systems  Constitutional: Negative.   HENT: Negative.   Eyes: Negative.   Respiratory: Negative.   Cardiovascular: Negative.   Gastrointestinal: Negative.   Endocrine: Negative.   Genitourinary: Negative.   Musculoskeletal: Negative.   Skin:  Negative.   Allergic/Immunologic: Negative.   Neurological: Negative.   Hematological: Negative.   Psychiatric/Behavioral: Negative.   All other systems reviewed and are negative.      Objective:   Physical Exam  Constitutional: He is oriented to person, place, and time. He appears well-developed and well-nourished.  HENT:  Head: Normocephalic and atraumatic.  Neck: Normal range of motion. Neck supple.  Cardiovascular: Normal rate and regular rhythm.   Pulmonary/Chest: Effort normal and breath sounds normal.  Musculoskeletal:  Normal Muscle Bulk and Muscle Testing Reveals: Upper Extremities: Full ROM and Muscle Strength 5/5 Lumbar Paraspinal Tenderness: L-3-L-5 Lower Extremities: Full ROM and Muscle Strength 5/5 Arises from Table with ease Narrow Based Gait   Neurological: He is  alert and oriented to person, place, and time.  Skin: Skin is warm and dry.  Psychiatric: He has a normal mood and affect.  Nursing note and vitals reviewed.         Assessment & Plan:  1. Chronic low back pain: Continue with Exercise Regime  2. Degenerative disk disease lumbar spine:  Refilled Methadone 10 mg take two tablets in the morning and one tablet in the evening #90 and  Percocet 7.5/325 mg one tablet every 8 hours prn #90. Second scripts given for the following month.  We will continue the opioid monitoring program, this consists of regular clinic visits, examinations, urine drug screen, pill counts as well as use of [place] Controlled Substance Reporting System.  20 minutes of face to face patient care time was spent during this visit. All questions were encouraged and answered.   F/U in 2 Months

## 2016-03-22 NOTE — Addendum Note (Signed)
Addended by: Barbee ShropshireBRIGHT, Evanie Buckle B on: 03/22/2016 09:16 AM   Modules accepted: Orders

## 2016-03-29 LAB — TOXASSURE SELECT,+ANTIDEPR,UR

## 2016-04-01 NOTE — Progress Notes (Signed)
Urine drug screen for this encounter is consistent for prescribed medication 

## 2016-05-20 ENCOUNTER — Encounter: Payer: Self-pay | Admitting: Physical Medicine & Rehabilitation

## 2016-05-20 ENCOUNTER — Encounter
Payer: BLUE CROSS/BLUE SHIELD | Attending: Physical Medicine & Rehabilitation | Admitting: Physical Medicine & Rehabilitation

## 2016-05-20 VITALS — BP 144/85 | HR 87

## 2016-05-20 DIAGNOSIS — Z5181 Encounter for therapeutic drug level monitoring: Secondary | ICD-10-CM | POA: Diagnosis not present

## 2016-05-20 DIAGNOSIS — M5116 Intervertebral disc disorders with radiculopathy, lumbar region: Secondary | ICD-10-CM | POA: Diagnosis not present

## 2016-05-20 DIAGNOSIS — Z79899 Other long term (current) drug therapy: Secondary | ICD-10-CM | POA: Diagnosis not present

## 2016-05-20 DIAGNOSIS — M545 Low back pain: Secondary | ICD-10-CM | POA: Diagnosis not present

## 2016-05-20 DIAGNOSIS — G894 Chronic pain syndrome: Secondary | ICD-10-CM | POA: Diagnosis not present

## 2016-05-20 MED ORDER — OXYCODONE-ACETAMINOPHEN 7.5-325 MG PO TABS: 1.0000 | ORAL_TABLET | Freq: Three times a day (TID) | ORAL | 0 refills | Status: DC | PRN

## 2016-05-20 MED ORDER — METHADONE HCL 10 MG PO TABS: ORAL_TABLET | ORAL | 0 refills | Status: DC

## 2016-05-20 NOTE — Addendum Note (Signed)
Addended by: Doreene ElandSHUMAKER, Xan Sparkman W on: 05/20/2016 10:30 AM   Modules accepted: Orders

## 2016-05-20 NOTE — Patient Instructions (Signed)
PLEASE FEEL FREE TO CALL OUR OFFICE WITH ANY PROBLEMS OR QUESTIONS [phone]    ATTEMPT TO DECREASE EVENING METHADONE TO 1.5 TABS

## 2016-05-20 NOTE — Progress Notes (Signed)
Subjective:    Patient ID: Joshua DauerBrad D Fairburn, male    DOB: [birth year], 41 y.o.   MRN: [id number]  HPI   Nida BoatmanBrad is here in follow up of his chronic low back pain. In the Fall he tried to go down to 25mg  methadone after decreasing to 30mg  but couldn't tolerate the taper. 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 He is using percocet for breakthrough pain. He tells me he stretches daily. His weight is essentially unchanged.   Pain Inventory Average Pain 5 Pain Right Now 5 My pain is sharp, stabbing and aching  In the last 24 hours, has pain interfered with the following? General activity 5 Relation with others 4 Enjoyment of life 5 What TIME of day is your pain at its worst? daytime, evening Sleep (in general) Fair  Pain is worse with: walking, bending, sitting, standing and some activites Pain improves with: rest, therapy/exercise and medication Relief from Meds: 5  Mobility walk without assistance how many minutes can you walk? 30  Function employed # of hrs/week 30  Neuro/Psych No problems in this area  Prior Studies Any changes since last visit?  no  Physicians involved in your care Any changes since last visit?  no   Family History  Problem Relation Age of Onset  . Hypertension Mother   . Diabetes Father    Social History   Social History  . Marital status: Single    Spouse name: N/A  . Number of children: N/A  . Years of education: N/A   Occupational History  . Dump Naval architectTruck Driver    Social History Main Topics  . Smoking status: Former Smoker    Packs/day: 1.00    Years: 25.00    Quit date: 10/23/2013  . Smokeless tobacco: Current User     Comment: Currently uses Vapor cig daily  . Alcohol use No  . Drug use: No  . Sexual activity: Yes    Birth control/ protection: Condom   Other Topics Concern  . Not on file   Social History Narrative  . No narrative on file   Past Surgical History:  Procedure Laterality Date  . KNEE SURGERY Left 2010    Past Medical History:  Diagnosis Date  . Disorders of sacrum   . Displacement of lumbar intervertebral disc without myelopathy   . Facet syndrome, lumbar   . Lumbago   . Thoracic or lumbosacral neuritis or radiculitis, unspecified    There were no vitals taken for this visit.  Opioid Risk Score:   Fall Risk Score:  `1  Depression screen PHQ 2/9  Dep[facility] screen [facility] 2/9 01/24/2016 11/27/2015 06/10/2014  Decreased Interest 0 0 0  Down, Depressed, Hopeless 0 0 0  PHQ - 2 Score 0 0 0  Altered sleeping - - 0  Tired, decreased energy - - 0  Change in appetite - - 0  Feeling bad or failure about yourself  - - 0  Trouble concentrating - - 0  Moving slowly or fidgety/restless - - 0  Suicidal thoughts - - 0  PHQ-9 Score - - 0    Review of Systems  Constitutional: Negative.   HENT: Negative.   Eyes: Negative.   Respiratory: Negative.   Cardiovascular: Negative.   Gastrointestinal: Negative.   Endocrine: Negative.   Genitourinary: Negative.   Musculoskeletal: Positive for back pain.  Skin: Negative.   Allergic/Immunologic: Negative.   Neurological: Negative.   Hematological: Negative.   Psychiatric/Behavioral: Negative.  Objective:   Physical Exam  Constitutional: He is oriented to person, place, and time. He appears well-developed and well-nourished. Weight unchanged  HENT:  Head: Normocephalic and atraumatic.  Eyes: Conjunctivae and EOM are normal. Pupils are equal, round, and reactive to light.  Neck: Normal range of motion. Neck supple.  Cardiovascular: RRR.  Pulmonary/Chest:effort normal.  Abdominal: Soft. Bowel sounds are normal.  Musculoskeletal:  Lumbar back: lumbar ROM decreased in flexion. Low back TTP  Cogntively he's alert an appropriate. Neurological: He is alert and oriented to person, place, and time.    Assessment & Plan:  ASSESSMENT:  1. Chronic low back pain  2. Degenerative disk disease lumbar spine.  3. OSA    PLAN:  1.  methadone 10 mg 2qhs and 1 qam. Will try again to decrease to 15mg  qhs, #90 for now. If successful in decreasing, consider change to another long acting opiate next month 2. Percocet 7.5/325 one p.o. q.6 h. p.r.n. 90  3. My NP will see him in about 1 Month. 10 minutes of face to face patient care time were spent during this visit. All questions were encouraged and answered. Again he needs to keep up with his stretching and aerobic exercises.

## 2016-06-21 ENCOUNTER — Encounter: Payer: BLUE CROSS/BLUE SHIELD | Attending: Physical Medicine & Rehabilitation | Admitting: Registered Nurse

## 2016-06-21 ENCOUNTER — Encounter: Payer: Self-pay | Admitting: Registered Nurse

## 2016-06-21 ENCOUNTER — Telehealth: Payer: Self-pay | Admitting: Registered Nurse

## 2016-06-21 VITALS — BP 144/79 | HR 73 | Resp 14

## 2016-06-21 DIAGNOSIS — G894 Chronic pain syndrome: Secondary | ICD-10-CM | POA: Diagnosis not present

## 2016-06-21 DIAGNOSIS — M5116 Intervertebral disc disorders with radiculopathy, lumbar region: Secondary | ICD-10-CM

## 2016-06-21 DIAGNOSIS — M545 Low back pain: Secondary | ICD-10-CM | POA: Diagnosis not present

## 2016-06-21 DIAGNOSIS — Z79899 Other long term (current) drug therapy: Secondary | ICD-10-CM | POA: Diagnosis not present

## 2016-06-21 DIAGNOSIS — Z5181 Encounter for therapeutic drug level monitoring: Secondary | ICD-10-CM | POA: Diagnosis not present

## 2016-06-21 MED ORDER — OXYCODONE-ACETAMINOPHEN 7.5-325 MG PO TABS: 1.0000 | ORAL_TABLET | Freq: Three times a day (TID) | ORAL | 0 refills | Status: DC | PRN

## 2016-06-21 NOTE — Progress Notes (Signed)
Subjective:    Patient ID: Joshua [name], male    DOB: December 09, 1975, 41 y.o.   MRN: [id number]  HPI: Joshua [name] is a 41 year old male who returns for follow up appointment for chronic pain and medication refill. He states his pain is in his lower back radiating into his left  lower extremities posteriorly with long periods of sitting and standing. He rates his pain 5. His current exercise regime is performing core exercises daily and walking. 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 He has tolerated the Methadone wean, will review his insurance plan for long acting opioid and discuss with Dr. Riley Kill, per Dr. Riley Kill note. He verbalizes understanding.   His last UDS was on 03/22/2016, it was consistent.   Pain Inventory Average Pain 6 Pain Right Now 5 My pain is sharp, stabbing and aching  In the last 24 hours, has pain interfered with the following? General activity 5 Relation with others 4 Enjoyment of life 5 What TIME of day is your pain at its worst? daytime, evening Sleep (in general) Fair  Pain is worse with: walking, bending, sitting and standing Pain improves with: rest, therapy/exercise and medication Relief from Meds: 5  Mobility walk without assistance how many minutes can you walk? 30 ability to climb steps?  yes do you drive?  yes transfers alone  Function employed # of hrs/week 30-40  Neuro/Psych No problems in this area  Prior Studies Any changes since last visit?  no  Physicians involved in your care Any changes since last visit?  no   Family History  Problem Relation Age of Onset  . Hypertension Mother   . Diabetes Father    Social History   Social History  . Marital status: Single    Spouse name: N/A  . Number of children: N/A  . 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 Smoking status: Former Smoker    Packs/day: 1.00    Years: 25.00    Quit date:  10/23/2013  . Smokeless tobacco: Current User     Comment: Currently uses Vapor cig daily  . Alcohol use No  . Drug use: No  . Sexual activity: Yes    Birth control/ protection: Condom   Other Topics Concern  . None   Social History Narrative  . None   Past Surgical History:  Procedure Laterality Date  . KNEE SURGERY Left 2010   Past Medical History:  Diagnosis Date  . Disorders of sacrum   . Displacement of lumbar intervertebral disc without myelopathy   . Facet syndrome, lumbar   . Lumbago   . Thoracic or lumbosacral neuritis or radiculitis, unspecified    BP (!) 144/79 (BP Location: Right Arm, Patient Position: Sitting, Cuff Size: Normal)   Pulse 73   Resp 14   SpO2 94%   Opioid Risk Score:   Fall Risk Score:  `1  Depression screen PHQ 2/9  Depression screen [facility] 2/9 01/24/2016 11/27/2015 06/10/2014  Decreased Interest 0 0 0  Down, Depressed, Hopeless 0 0 0  PHQ - 2 Score 0 0 0  Altered sleeping - - 0  Tired, decreased energy - - 0  Change in appetite - - 0  Feeling bad or failure about yourself  - - 0  Trouble concentrating - - 0  Moving slowly or fidgety/restless - - 0  Suicidal thoughts - -  0  PHQ-9 Score - - 0   ' Review of Systems  Constitutional: Negative.   HENT: Negative.   Eyes: Negative.   Respiratory: Negative.   Cardiovascular: Negative.   Gastrointestinal: Negative.   Endocrine: Negative.   Genitourinary: Negative.   Musculoskeletal: Positive for back pain.  Skin: Negative.   Allergic/Immunologic: Negative.   Neurological: Negative.   Hematological: Negative.   Psychiatric/Behavioral: Negative.   All other systems reviewed and are negative.      Objective:   Physical Exam  Constitutional: He is oriented to person, place, and time. He appears well-developed and well-nourished.  HENT:  Head: Normocephalic and atraumatic.  Neck: Normal range of motion. Neck supple.  Cardiovascular: Normal rate and regular rhythm.   Pulmonary/Chest:  Effort normal and breath sounds normal.  Musculoskeletal:  Normal Muscle Bulk and Muscle Testing Reveals: Upper Extremities: Full ROM and Muscle Strength 5/5 Lumbar Paraspinal Tenderness: L-4-L-5 Lower Extremities: Full ROM and Muscle Strength 5/5 Arises from table with ease Narrow Based Gait   Neurological: He is alert and oriented to person, place, and time.  Skin: Skin is warm and dry.  Psychiatric: He has a normal mood and affect.  Nursing note and vitals reviewed.         Assessment & Plan:  1. Chronic low back pain: Continue with Home Exercise Regime. 06/21/2016 2. Degenerative disk disease lumbar spine: 06/21/2016  Continue Methadone 10 mg one  tablet in the morningand one tablet in the evening and Refilled: Percocet 7.5/325 mg one tablet every 8 hours prn #90.  We will continue the opioid monitoring program, this consists of regular clinic visits, examinations, urine drug screen, pill counts as well as use of [place] Controlled Substance Reporting System.  20 minutes of face to face patient care time was spent during this visit. All questions were encouraged and answered.  F/U in 1 Month

## 2016-06-21 NOTE — Telephone Encounter (Signed)
On 06/21/2016 the  NCCSR was reviewed no conflict was seen on the [facility] Controlled Substance Reporting System with multiple prescribers. Joshua [name] has a signed narcotic contract with our office. If there were any discrepancies this would have been reported to his physician.

## 2016-06-28 ENCOUNTER — Telehealth: Payer: Self-pay | Admitting: *Deleted

## 2016-06-28 LAB — TOXASSURE SELECT,+ANTIDEPR,UR

## 2016-06-28 NOTE — Telephone Encounter (Signed)
Urine drug screen for this encounter is consistent for prescribed medication 

## 2016-07-09 ENCOUNTER — Encounter: Payer: BLUE CROSS/BLUE SHIELD | Attending: Physical Medicine & Rehabilitation | Admitting: Registered Nurse

## 2016-07-09 ENCOUNTER — Encounter: Payer: Self-pay | Admitting: Registered Nurse

## 2016-07-09 VITALS — BP 142/81 | HR 70

## 2016-07-09 DIAGNOSIS — G894 Chronic pain syndrome: Secondary | ICD-10-CM | POA: Diagnosis not present

## 2016-07-09 DIAGNOSIS — M545 Low back pain: Secondary | ICD-10-CM | POA: Insufficient documentation

## 2016-07-09 DIAGNOSIS — M5116 Intervertebral disc disorders with radiculopathy, lumbar region: Secondary | ICD-10-CM

## 2016-07-09 DIAGNOSIS — Z5181 Encounter for therapeutic drug level monitoring: Secondary | ICD-10-CM

## 2016-07-09 DIAGNOSIS — Z79899 Other long term (current) drug therapy: Secondary | ICD-10-CM | POA: Diagnosis not present

## 2016-07-09 MED ORDER — OXYCODONE-ACETAMINOPHEN 7.5-325 MG PO TABS: 1.0000 | ORAL_TABLET | Freq: Three times a day (TID) | ORAL | 0 refills | Status: DC | PRN

## 2016-07-09 MED ORDER — METHADONE HCL 10 MG PO TABS: 10.0000 mg | ORAL_TABLET | Freq: Two times a day (BID) | ORAL | 0 refills | Status: DC

## 2016-07-09 NOTE — Progress Notes (Signed)
Subjective:    Patient ID: Joshua [name], male    DOB: 21-Mar-1975, 41 y.o.   MRN: [id number]  HPI:  [name] is a 41year old male who returns for follow up appointmentfor chronic pain and medication refill. He states his pain is in his lower back radiating into his left  lower extremitiesposteriorly with long periods of sitting and standing. He rates his pain 6. His current exercise regime is performing core exercises daily and walking. 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 He has tolerated the Methadone wean, the long acting opioid was discuss with Dr. Riley Kill. We will prescribe Xtampza 9 mg,  if approved by his insurance plan.  His last UDS was on 06/21/2016, it was consistent.   Pain Inventory Average Pain 5 Pain Right Now 6 My pain is sharp, stabbing and aching  In the last 24 hours, has pain interfered with the following? General activity 5 Relation with others 4 Enjoyment of life 4 What TIME of day is your pain at its worst? evening Sleep (in general) Fair  Pain is worse with: walking, bending, sitting, standing and some activites Pain improves with: rest, therapy/exercise and medication Relief from Meds: 5  Mobility walk without assistance ability to climb steps?  yes do you drive?  yes  Function employed # of hrs/week 40  Neuro/Psych No problems in this area  Prior Studies Any changes since last visit?  no  Physicians involved in your care Any changes since last visit?  no   Family History  Problem Relation Age of Onset  . Hypertension Mother   . Diabetes Father    Social History   Social History  . Marital status: Single    Spouse name: N/A  . Number of children: N/A  . 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 Smoking status: Former Smoker    Packs/day: 1.00    Years: 25.00    Quit date: 10/23/2013  . Smokeless tobacco: Current User     Comment:  Currently uses Vapor cig daily  . Alcohol use No  . Drug use: No  . Sexual activity: Yes    Birth control/ protection: Condom   Other Topics Concern  . Not on file   Social History Narrative  . No narrative on file   Past Surgical History:  Procedure Laterality Date  . KNEE SURGERY Left 2010   Past Medical History:  Diagnosis Date  . Disorders of sacrum   . Displacement of lumbar intervertebral disc without myelopathy   . Facet syndrome, lumbar (HCC)   . Lumbago   . Thoracic or lumbosacral neuritis or radiculitis, unspecified    There were no vitals taken for this visit.  Opioid Risk Score:   Fall Risk Score:  `1  Depression screen PHQ 2/9  Depression screen [facility] 2/9 01/24/2016 11/27/2015 06/10/2014  Decreased Interest 0 0 0  Down, Depressed, Hopeless 0 0 0  PHQ - 2 Score 0 0 0  Altered sleeping - - 0  Tired, decreased energy - - 0  Change in appetite - - 0  Feeling bad or failure about yourself  - - 0  Trouble concentrating - - 0  Moving slowly or fidgety/restless - - 0  Suicidal thoughts - - 0  PHQ-9 Score - - 0    Review of Systems  Constitutional: Negative.   HENT: Negative.  Eyes: Negative.   Respiratory: Negative.   Cardiovascular: Negative.   Gastrointestinal: Negative.   Endocrine: Negative.   Genitourinary: Negative.   Musculoskeletal: Positive for back pain.  Skin: Negative.   Allergic/Immunologic: Negative.   Neurological: Negative.   Hematological: Negative.   Psychiatric/Behavioral: Negative.   All other systems reviewed and are negative.      Objective:   Physical Exam  Constitutional: He is oriented to person, place, and time. He appears well-developed and well-nourished.  HENT:  Head: Normocephalic and atraumatic.  Neck: Normal range of motion. Neck supple.  Cardiovascular: Normal rate and regular rhythm.   Pulmonary/Chest: Effort normal and breath sounds normal.  Musculoskeletal:  Normal Muscle Bulk and Muscle Testing  Reveals: Upper Extremities: Full ROM and Muscle Strength 5/5 Lumbar Paraspinal Tenderness: L-3-L-5 Lower Extremities: Full ROM and Muscle Strength 5/5 Arises from Table with ease Narrow Based Gait   Neurological: He is alert and oriented to person, place, and time.  Skin: Skin is warm and dry.  Psychiatric: He has a normal mood and affect.  Nursing note and vitals reviewed.         Assessment & Plan:  1. Chronic low back pain: Continue with Home Exercise Regime. 07/09/2016 2. Degenerative disk disease lumbar spine: 07/09/2016  Refilled: Methadone 10 mg one  tablet in the morningand one tablet in the evening and Percocet 7.5/325 mg one tablet every 8 hours prn #90.  We will continue the opioid monitoring program, this consists of regular clinic visits, examinations, urine drug screen, pill counts as well as use of [place] Controlled Substance Reporting System.  20 minutes of face to face patient care time was spent during this visit. All questions were encouraged and answered.   F/U in 1 Month

## 2016-08-16 ENCOUNTER — Encounter: Payer: BLUE CROSS/BLUE SHIELD | Attending: Physical Medicine & Rehabilitation | Admitting: Registered Nurse

## 2016-08-16 ENCOUNTER — Encounter: Payer: Self-pay | Admitting: Registered Nurse

## 2016-08-16 VITALS — BP 138/80 | HR 75

## 2016-08-16 DIAGNOSIS — Z5181 Encounter for therapeutic drug level monitoring: Secondary | ICD-10-CM | POA: Insufficient documentation

## 2016-08-16 DIAGNOSIS — Z79899 Other long term (current) drug therapy: Secondary | ICD-10-CM | POA: Diagnosis not present

## 2016-08-16 DIAGNOSIS — M545 Low back pain: Secondary | ICD-10-CM | POA: Insufficient documentation

## 2016-08-16 DIAGNOSIS — M5116 Intervertebral disc disorders with radiculopathy, lumbar region: Secondary | ICD-10-CM | POA: Insufficient documentation

## 2016-08-16 DIAGNOSIS — G894 Chronic pain syndrome: Secondary | ICD-10-CM

## 2016-08-16 MED ORDER — OXYCODONE-ACETAMINOPHEN 7.5-325 MG PO TABS: 1.0000 | ORAL_TABLET | Freq: Three times a day (TID) | ORAL | 0 refills | Status: DC | PRN

## 2016-08-16 MED ORDER — METHADONE HCL 10 MG PO TABS: 10.0000 mg | ORAL_TABLET | Freq: Two times a day (BID) | ORAL | 0 refills | Status: DC

## 2016-08-16 NOTE — Progress Notes (Signed)
Subjective:    Patient ID: Joshua [name], male    DOB: Jun 20, 1975, 41 y.o.   MRN: [id number]  HPI: Joshua [name] is a 41year old male who returns for follow up appointmentfor chronic pain and medication refill. He states his pain is in his lower back occasionally radiates into his left lower extremitiesposteriorly with long periods of sitting and standing. He rates his pain 7. His current exercise regime is performing core exercises daily and walking.  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  Joshua [name] reviewed, will discuss with Dr. Riley Kill and give him a call. He verbalizes understanding.    His last UDS was on 06/21/2016, it was consistent.    Pain Inventory Average Pain 6 Pain Right Now 7 My pain is sharp, stabbing and aching  In the last 24 hours, has pain interfered with the following? General activity 5 Relation with others 6 Enjoyment of life 6 What TIME of day is your pain at its worst? all Sleep (in general) Fair  Pain is worse with: walking, bending, sitting, standing and some activites Pain improves with: rest, therapy/exercise and medication Relief from Meds: 3  Mobility walk without assistance how many minutes can you walk? 30 ability to climb steps?  yes do you drive?  yes transfers alone  Function employed # of hrs/week 30-40  Neuro/Psych No problems in this area  Prior Studies Any changes since last visit?  no  Physicians involved in your care Any changes since last visit?  no   Family History  Problem Relation Age of Onset  . Hypertension Mother   . Diabetes Father    Social History   Social History  . Marital status: Single    Spouse name: N/A  . Number of children: N/A  . 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 Smoking status: Former Smoker    Packs/day: 1.00    Years: 25.00    Quit date: 10/23/2013  . Smokeless tobacco:  Current User     Comment: Currently uses Vapor cig daily  . Alcohol use No  . Drug use: No  . Sexual activity: Yes    Birth control/ protection: Condom   Other Topics Concern  . None   Social History Narrative  . None   Past Surgical History:  Procedure Laterality Date  . KNEE SURGERY Left 2010   Past Medical History:  Diagnosis Date  . Disorders of sacrum   . Displacement of lumbar intervertebral disc without myelopathy   . Facet syndrome, lumbar (HCC)   . Lumbago   . Thoracic or lumbosacral neuritis or radiculitis, unspecified    BP 138/80 (BP Location: Right Arm, Patient Position: Sitting, Cuff Size: Large)   Pulse 75   SpO2 98%   Opioid Risk Score:   Fall Risk Score:  `1  Depression screen PHQ 2/9  Depression screen [facility] 2/9 01/24/2016 11/27/2015 06/10/2014  Decreased Interest 0 0 0  Down, Depressed, Hopeless 0 0 0  PHQ - 2 Score 0 0 0  Altered sleeping - - 0  Tired, decreased energy - - 0  Change in appetite - - 0  Feeling bad or failure about yourself  - - 0  Trouble concentrating - - 0  Moving slowly or fidgety/restless - - 0  Suicidal thoughts - - 0  PHQ-9 Score - - 0  Review of Systems  Constitutional: Negative.   HENT: Negative.   Eyes: Negative.   Respiratory: Negative.   Cardiovascular: Negative.   Gastrointestinal: Negative.   Endocrine: Negative.   Genitourinary: Negative.   Musculoskeletal: Positive for back pain.  Skin: Negative.   Allergic/Immunologic: Negative.   Neurological: Negative.   Hematological: Negative.   Psychiatric/Behavioral: Negative.   All other systems reviewed and are negative.      Objective:   Physical Exam  Constitutional: He is oriented to person, place, and time. He appears well-developed and well-nourished.  HENT:  Head: Normocephalic and atraumatic.  Neck: Normal range of motion. Neck supple.  Cardiovascular: Normal rate and regular rhythm.   Pulmonary/Chest: Effort normal and breath sounds normal.    Musculoskeletal:  Normal Muscle Bulk and Muscle Testing Reveals: Upper Extremities: Full ROM and Muscle Strength 5/5 Lumbar Paraspinal Tenderness: L-3-L-5 Lower Extremities: Full ROM and Muscle Strength 5/5 Arises from Table with ease Narrow Based Gait   Neurological: He is alert and oriented to person, place, and time.  Skin: Skin is warm and dry.  Psychiatric: He has a normal mood and affect.  Nursing note and vitals reviewed.         Assessment & Plan:  1. Chronic low back pain: Continue with HomeExercise Regime. 08/16/2016 2. Degenerative disk disease lumbar spine: 08/16/2016  Refilled: Methadone 10 mg one tablet every 12 hours #60 and Percocet 7.5/325 mg one tablet every 8 hours prn #90.  We will continue the opioid monitoring program, this consists of regular clinic visits, examinations, urine drug screen, pill counts as well as use of [place] Controlled Substance [place].  20 minutes of face to face patient care time was spent during this visit. All questions were encouraged and answered.    F/U in 27Month

## 2016-08-21 ENCOUNTER — Telehealth: Payer: Self-pay | Admitting: Registered Nurse

## 2016-08-21 NOTE — Telephone Encounter (Signed)
Letter written to [employer], regarding [place].

## 2016-08-22 ENCOUNTER — Telehealth: Payer: Self-pay | Admitting: *Deleted

## 2016-08-22 LAB — TOXASSURE SELECT,+ANTIDEPR,UR

## 2016-08-22 NOTE — Telephone Encounter (Signed)
Urine drug screen for this encounter is consistent for prescribed medication 

## 2016-08-26 ENCOUNTER — Other Ambulatory Visit: Payer: Self-pay | Admitting: *Deleted

## 2016-08-27 ENCOUNTER — Telehealth: Payer: Self-pay | Admitting: Registered Nurse

## 2016-08-27 MED ORDER — MORPHINE SULFATE ER 15 MG PO TBCR: 15.0000 mg | EXTENDED_RELEASE_TABLET | Freq: Two times a day (BID) | ORAL | 0 refills | Status: DC

## 2016-08-27 NOTE — Telephone Encounter (Signed)
Placed a call to Joshua [name], Xtampza was denied by his insurance company. Will prescribe MS Contin 15 mg Q 12 hour, this was discussed with Dr. Riley KillSwartz he agrees with plan. Joshua [name] will bring in his methadone to be counted, methadone will be given to him in case MS Contin needs a Prior Authorization. He verbalizes understanding.  Joshua [name] will try to pick up prescription on Friday 08/30/2016

## 2016-08-30 ENCOUNTER — Telehealth: Payer: Self-pay

## 2016-08-30 NOTE — Telephone Encounter (Signed)
Mr Joshua [name] came in to pick up a prescription of medication in person today and have his methadone counted at same time:  Methadone 10mg  08/17/16    D#60     V#30 Last dose was last pm  Meds returned to patient.

## 2016-09-27 ENCOUNTER — Telehealth: Payer: Self-pay | Admitting: Registered Nurse

## 2016-09-27 ENCOUNTER — Encounter: Payer: BLUE CROSS/BLUE SHIELD | Attending: Physical Medicine & Rehabilitation | Admitting: Registered Nurse

## 2016-09-27 ENCOUNTER — Encounter: Payer: Self-pay | Admitting: Registered Nurse

## 2016-09-27 VITALS — BP 126/86 | HR 71

## 2016-09-27 DIAGNOSIS — Z5181 Encounter for therapeutic drug level monitoring: Secondary | ICD-10-CM | POA: Insufficient documentation

## 2016-09-27 DIAGNOSIS — M5116 Intervertebral disc disorders with radiculopathy, lumbar region: Secondary | ICD-10-CM | POA: Diagnosis not present

## 2016-09-27 DIAGNOSIS — M5416 Radiculopathy, lumbar region: Secondary | ICD-10-CM

## 2016-09-27 DIAGNOSIS — Z79899 Other long term (current) drug therapy: Secondary | ICD-10-CM | POA: Diagnosis not present

## 2016-09-27 DIAGNOSIS — G894 Chronic pain syndrome: Secondary | ICD-10-CM

## 2016-09-27 DIAGNOSIS — M545 Low back pain: Secondary | ICD-10-CM | POA: Insufficient documentation

## 2016-09-27 MED ORDER — MORPHINE SULFATE ER 15 MG PO TBCR: 15.0000 mg | EXTENDED_RELEASE_TABLET | Freq: Two times a day (BID) | ORAL | 0 refills | Status: DC

## 2016-09-27 MED ORDER — OXYCODONE-ACETAMINOPHEN 7.5-325 MG PO TABS: 1.0000 | ORAL_TABLET | Freq: Three times a day (TID) | ORAL | 0 refills | Status: DC | PRN

## 2016-09-27 NOTE — Telephone Encounter (Signed)
On 09/27/2016 the NCCSR was reviewed no conflict was seen on the [facility] [facility] [place] with multiple prescribers. [name] has a signed narcotic contract with our office. If there were any discrepancies this would have been reported to his  physician.

## 2016-09-27 NOTE — Progress Notes (Signed)
Subjective:    Patient ID: Joshua [name], male    DOB: 22-Aug-1975, 41 y.o.   MRN: [id number]  HPI: : Mr. MRK BUZBY is a 41year old male who returns for follow up appointmentfor chronic pain and medication refill. He states his pain is in his lower back occasionally radiates into his left lower extremitiesposteriorly with long periods of sitting and standing. He rates his pain 6. His current exercise regime is performing core exercises daily and walking.   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   His last UDS was on 08/16/2016, it was consistent.    Pain Inventory Average Pain 5 Pain Right Now 6 My pain is sharp, stabbing and aching  In the last 24 hours, has pain interfered with the following? General activity 5 Relation with others 4 Enjoyment of life 6 What TIME of day is your pain at its worst? evening Sleep (in general) Fair  Pain is worse with: walking, bending, sitting, standing and some activites Pain improves with: rest, therapy/exercise and medication Relief from Meds: 4  Mobility walk without assistance ability to climb steps?  yes do you drive?  yes  Function employed # of hrs/week 40  Neuro/Psych No problems in this area  Prior Studies Any changes since last visit?  no  Physicians involved in your care Any changes since last visit?  no   Family History  Problem Relation Age of Onset  . Hypertension Mother   . Diabetes Father    Social History   Social History  . Marital status: Single    Spouse name: N/A  . Number of children: N/A  . 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 Smoking status: Former Smoker    Packs/day: 1.00    Years: 25.00    Quit date: 10/23/2013  . Smokeless tobacco: Current User     Comment: Currently uses Vapor cig daily  . Alcohol use No  . Drug use: No  . Sexual activity: Yes    Birth control/ protection: Condom   Other  Topics Concern  . Not on file   Social History Narrative  . No narrative on file   Past Surgical History:  Procedure Laterality Date  . KNEE SURGERY Left 2010   Past Medical History:  Diagnosis Date  . Disorders of sacrum   . Displacement of lumbar intervertebral disc without myelopathy   . Facet syndrome, lumbar (HCC)   . Lumbago   . Thoracic or lumbosacral neuritis or radiculitis, unspecified    There were no vitals taken for this visit.  Opioid Risk Score:   Fall Risk Score:  `1  Depression screen PHQ 2/9  Depression screen [facility] 2/9 01/24/2016 11/27/2015 06/10/2014  Decreased Interest 0 0 0  Down, Depressed, Hopeless 0 0 0  PHQ - 2 Score 0 0 0  Altered sleeping - - 0  Tired, decreased energy - - 0  Change in appetite - - 0  Feeling bad or failure about yourself  - - 0  Trouble concentrating - - 0  Moving slowly or fidgety/restless - - 0  Suicidal thoughts - - 0  PHQ-9 Score - - 0     Review of Systems  Constitutional: Negative.   HENT: Negative.   Eyes: Negative.   Respiratory: Negative.   Cardiovascular: Negative.   Gastrointestinal: Negative.   Endocrine: Negative.   Genitourinary: Negative.  Musculoskeletal: Negative.   Skin: Negative.   Allergic/Immunologic: Negative.   Neurological: Negative.   Hematological: Negative.   Psychiatric/Behavioral: Negative.   All other systems reviewed and are negative.      Objective:   Physical Exam  Constitutional: He is oriented to person, place, and time. He appears well-developed and well-nourished.  HENT:  Head: Normocephalic and atraumatic.  Neck: Normal range of motion. Neck supple.  Cardiovascular: Normal rate and regular rhythm.   Pulmonary/Chest: Effort normal and breath sounds normal.  Musculoskeletal:  Normal Muscle Bulk and Muscle Testing Reveals: Upper Extremities: Full ROM and Muscle Strength 5/5 Lumbar Paraspinal Tenderness: L-3-L-5 Lower Extremities: Full ROM and Muscle Strength 5/5 Arises  from Table with ease Narrow Based Gait  Neurological: He is alert and oriented to person, place, and time.  Skin: Skin is warm and dry.  Psychiatric: He has a normal mood and affect.  Nursing note and vitals reviewed.         Assessment & Plan:  1. Chronic low back pain: Continue with HomeExercise Regime. 09/27/2016 2. Degenerative disk disease lumbar spine: 09/27/2016  Refilled: Morphine 15 mg one tablet every 12 hours #60 and Percocet 7.5/325 mg one tablet every 8 hours prn #90.  We will continue the opioid monitoring program, this consists of regular clinic visits, examinations, urine drug screen, pill counts as well as use of [place] Controlled Substance [place] System.  20 minutes of face to face patient care time was spent during this visit. All questions were encouraged and answered.   F/U in 3Month

## 2016-10-15 ENCOUNTER — Telehealth: Payer: Self-pay | Admitting: *Deleted

## 2016-10-15 NOTE — Telephone Encounter (Signed)
Patient left a message asking for Riley Lamunice to call him back. He says his phone is a little wonky so you may have to try twice, back to back, to get through

## 2016-10-16 NOTE — Telephone Encounter (Signed)
Return [name] call,  [name] reports the MS Contin is lasting between 8-10 hours, he's on Oxycodone 7.5/325 mg one tablet every 8 hours as needed.  He was tapered off the Methadone 20 mg/day.  [facility], she will call office after she obtains information on the Methadone and MS Contin Conversion. [name] informed Dr. Riley KillSwartz is on vacation and I will discuss the above on Monday 10/21/2016, he verbalizes understanding.

## 2016-10-21 ENCOUNTER — Telehealth: Payer: Self-pay | Admitting: Registered Nurse

## 2016-10-21 NOTE — Telephone Encounter (Signed)
Spoke with Dr. Riley KillSwartz regarding [name] concerned of the MS Contin lasting 8-10 hours. We will continue current medication regime. Left message for [name] to return the call.  Will speak with him regarding working on other strategies for pain control including optimization of his mood, exercise, appropriate sleep, and leisure activities.  Will encourage him to use heat and ice therapy as well. Awaiting his call back.

## 2016-10-23 ENCOUNTER — Telehealth: Payer: Self-pay | Admitting: Physical Medicine & Rehabilitation

## 2016-10-23 NOTE — Telephone Encounter (Signed)
Patient returning Joshua LefevreEunice [name] phone call.  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  His number is [phone].

## 2016-10-24 NOTE — Telephone Encounter (Signed)
Return [name] call, He is aware this provider spoke to Dr. Riley KillSwartz and the medications will remain the same. He verbalizes understanding.

## 2016-10-25 ENCOUNTER — Encounter: Payer: BLUE CROSS/BLUE SHIELD | Attending: Physical Medicine & Rehabilitation | Admitting: Registered Nurse

## 2016-10-25 ENCOUNTER — Encounter: Payer: Self-pay | Admitting: Registered Nurse

## 2016-10-25 VITALS — BP 136/83 | HR 85 | Resp 16

## 2016-10-25 DIAGNOSIS — M5116 Intervertebral disc disorders with radiculopathy, lumbar region: Secondary | ICD-10-CM

## 2016-10-25 DIAGNOSIS — M545 Low back pain: Secondary | ICD-10-CM | POA: Insufficient documentation

## 2016-10-25 DIAGNOSIS — G894 Chronic pain syndrome: Secondary | ICD-10-CM | POA: Diagnosis not present

## 2016-10-25 DIAGNOSIS — Z79899 Other long term (current) drug therapy: Secondary | ICD-10-CM | POA: Diagnosis not present

## 2016-10-25 DIAGNOSIS — Z5181 Encounter for therapeutic drug level monitoring: Secondary | ICD-10-CM

## 2016-10-25 MED ORDER — OXYCODONE-ACETAMINOPHEN 7.5-325 MG PO TABS: 1.0000 | ORAL_TABLET | Freq: Three times a day (TID) | ORAL | 0 refills | Status: DC | PRN

## 2016-10-25 MED ORDER — MORPHINE SULFATE ER 15 MG PO TBCR: 15.0000 mg | EXTENDED_RELEASE_TABLET | Freq: Two times a day (BID) | ORAL | 0 refills | Status: DC

## 2016-10-25 NOTE — Progress Notes (Signed)
Subjective:    Patient ID: Joshua [name], male    DOB: 11/23/1975, 41 y.o.   MRN: [id number]  HPI: [name] is a 41year old male who returns for follow up appointmentfor chronic pain and medication refill. He states his pain is in his lower back occasionally radiatesinto his left lower extremitiesposteriorly with long periods of sitting and standing. He rates his pain 5. His current exercise regime is performing core exercises daily and walking.   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  Pain Inventory Average Pain 5 Pain Right Now 5 My pain is sharp, stabbing and aching  In the last 24 hours, has pain interfered with the following? General activity 5 Relation with others 4 Enjoyment of life 4 What TIME of day is your pain at its worst? Morning, Evening and night  Sleep (in general) Fair  Pain is worse with: walking, bending, sitting, standing and some activites Pain improves with: rest, therapy/exercise and medication Relief from Meds: 5  Mobility walk without assistance ability to climb steps?  yes do you drive?  yes Do you have any goals in this area?  no  Function employed # of hrs/week 30-40 Do you have any goals in this area?  no  Neuro/Psych No problems in this area  Prior Studies Any changes since last visit?  no  Physicians involved in your care Any changes since last visit?  no   Family History  Problem Relation Age of Onset  . Hypertension Mother   . Diabetes Father    Social History   Social History  . Marital status: Single    Spouse name: N/A  . Number of children: N/A  . 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 Smoking status: Former Smoker    Packs/day: 1.00    Years: 25.00    Quit date: 10/23/2013  . Smokeless tobacco: Current User     Comment: Currently uses Vapor cig daily  . Alcohol use No  . Drug use: No  . Sexual activity: Yes   Birth control/ protection: Condom   Other Topics Concern  . None   Social History Narrative  . None   Past Surgical History:  Procedure Laterality Date  . KNEE SURGERY Left 2010   Past Medical History:  Diagnosis Date  . Disorders of sacrum   . Displacement of lumbar intervertebral disc without myelopathy   . Facet syndrome, lumbar (HCC)   . Lumbago   . Thoracic or lumbosacral neuritis or radiculitis, unspecified    There were no vitals taken for this visit.  Opioid Risk Score:   Fall Risk Score:  `1  Depression screen PHQ 2/9  Depression screen [facility] 2/9 01/24/2016 11/27/2015 06/10/2014  Decreased Interest 0 0 0  Down, Depressed, Hopeless 0 0 0  PHQ - 2 Score 0 0 0  Altered sleeping - - 0  Tired, decreased energy - - 0  Change in appetite - - 0  Feeling bad or failure about yourself  - - 0  Trouble concentrating - - 0  Moving slowly or fidgety/restless - - 0  Suicidal thoughts - - 0  PHQ-9 Score - - 0    cardiopulmonary resuscitation     Review of Systems  All other systems reviewed and are negative.      Objective:   Physical Exam  Constitutional: He is oriented to  person, place, and time. He appears well-developed and well-nourished.  HENT:  Head: Normocephalic and atraumatic.  Neck: Normal range of motion. Neck supple.  Cardiovascular: Normal rate and regular rhythm.   Pulmonary/Chest: Effort normal and breath sounds normal.  Musculoskeletal:  Normal Muscle Bulk and Muscle Testing Reveals:  Upper Extremities: Full ROM and Muscle Strength 5/5 Lumbar Paraspinal Tenderness: L-3-L-5 Lower Extremities: Full ROM and Muscle Strength 5/5 Arises from Table with ease Narrow Based Gait  Neurological: He is alert and oriented to person, place, and time.  Skin: Skin is warm and dry.  Psychiatric: He has a normal mood and affect.  Nursing note and vitals reviewed.         Assessment & Plan:  1. Chronic low back pain: Continue with HomeExercise Regime.  10/25/2016 2. Degenerative disk disease lumbar spine: 10/25/2016  Refilled: Morphine ( MS Contin) 15 mg one tablet every 12 hours #60and Percocet 7.5/325 mg one tablet every 8 hours prn #90.  We will continue the opioid monitoring program, this consists of regular clinic visits, examinations, urine drug screen, pill counts as well as use of [place] Controlled Substance Reporting System.  15 minutes of face to face patient care time was spent during this visit. All questions were encouraged and answered.  F/U in 

## 2016-11-25 ENCOUNTER — Encounter: Payer: Self-pay | Admitting: Physical Medicine & Rehabilitation

## 2016-11-25 ENCOUNTER — Encounter
Payer: BLUE CROSS/BLUE SHIELD | Attending: Physical Medicine & Rehabilitation | Admitting: Physical Medicine & Rehabilitation

## 2016-11-25 VITALS — BP 126/85 | HR 77 | Resp 14

## 2016-11-25 DIAGNOSIS — M5116 Intervertebral disc disorders with radiculopathy, lumbar region: Secondary | ICD-10-CM

## 2016-11-25 DIAGNOSIS — Z5181 Encounter for therapeutic drug level monitoring: Secondary | ICD-10-CM | POA: Diagnosis not present

## 2016-11-25 DIAGNOSIS — Z79899 Other long term (current) drug therapy: Secondary | ICD-10-CM | POA: Insufficient documentation

## 2016-11-25 DIAGNOSIS — M545 Low back pain: Secondary | ICD-10-CM | POA: Insufficient documentation

## 2016-11-25 MED ORDER — MORPHINE SULFATE ER 30 MG PO TBCR: 30.0000 mg | EXTENDED_RELEASE_TABLET | Freq: Two times a day (BID) | ORAL | 0 refills | Status: DC

## 2016-11-25 MED ORDER — OXYCODONE-ACETAMINOPHEN 7.5-325 MG PO TABS: 1.0000 | ORAL_TABLET | Freq: Three times a day (TID) | ORAL | 0 refills | Status: DC | PRN

## 2016-11-25 NOTE — Progress Notes (Signed)
Subjective:    Patient ID: Joshua [name], male    DOB: Apr 08, 1975, 41 y.o.   MRN: [id number]  HPI   Derry is here in follo wup of his chronic low back pain. 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 He states that the ms contin isn't lasting as long nor giving him as much relief. He also reports nasal congestion after using morphine but is not sure if it's not just an allergy. 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 He is doing some stretching while at work and walks when he can get the chance. He has lost 40lbs over the course of this year as well.   He reports that his bowels and bladder are moving regularly.    Pain Inventory Average Pain 6 Pain Right Now 6 My pain is sharp, stabbing and aching  In the last 24 hours, has pain interfered with the following? General activity 5 Relation with others 5 Enjoyment of life 5 What TIME of day is your pain at its worst? daytime, evening Sleep (in general) Fair  Pain is worse with: walking, bending, sitting, standing and some activites Pain improves with: rest, therapy/exercise and medication Relief from Meds: 4  Mobility walk without assistance ability to climb steps?  yes do you drive?  yes Do you have any goals in this area?  no  Function employed # of hrs/week 30-40  Neuro/Psych No problems in this area  Prior Studies Any changes since last visit?  no  Physicians involved in your care Any changes since last visit?  no   Family History  Problem Relation Age of Onset  . Hypertension Mother   . Diabetes Father    Social History   Social History  . Marital status: Single    Spouse name: N/A  . Number of children: N/A  . 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 Smoking status: Former Smoker    Packs/day: 1.00    Years: 25.00    Quit date: 10/23/2013  . Smokeless tobacco: Current User       Comment: Currently uses Vapor cig daily  . Alcohol use No  . Drug use: No  . Sexual activity: Yes    Birth control/ protection: Condom   Other Topics Concern  . None   Social History Narrative  . None   Past Surgical History:  Procedure Laterality Date  . KNEE SURGERY Left 2010   Past Medical History:  Diagnosis Date  . Disorders of sacrum   . Displacement of lumbar intervertebral disc without myelopathy   . Facet syndrome, lumbar (HCC)   . Lumbago   . Thoracic or lumbosacral neuritis or radiculitis, unspecified    BP 126/85 (BP Location: Left Arm, Patient Position: Sitting, Cuff Size: Large)   Pulse 77   Resp 14   SpO2 97%   Opioid Risk Score:   Fall Risk Score:  `1  Depression screen PHQ 2/9  Depression screen [facility] 2/9 01/24/2016 11/27/2015 06/10/2014  Decreased Interest 0 0 0  Down, Depressed, Hopeless 0 0 0  PHQ - 2 Score 0 0 0  Altered sleeping - - 0  Tired, decreased energy - - 0  Change in appetite - - 0  Feeling bad or failure about yourself  - - 0  Trouble concentrating - - 0  Moving  slowly or fidgety/restless - - 0  Suicidal thoughts - - 0  PHQ-9 Score - - 0    Review of Systems  Constitutional: Negative.   HENT: Negative.   Eyes: Negative.   Respiratory: Negative.   Cardiovascular: Negative.   Gastrointestinal: Negative.   Endocrine: Negative.   Genitourinary: Negative.   Musculoskeletal: Positive for back pain.  Skin: Negative.   Allergic/Immunologic: Negative.   Neurological: Negative.   Hematological: Negative.   Psychiatric/Behavioral: Negative.   All other systems reviewed and are negative.      Objective:   Physical Exam  Constitutional: He is oriented to person, place, and time. He appears well-developed and well-nourished. Weight unchanged  HENT:  Head: Normocephalic and atraumatic.  Eyes: Conjunctivae and EOM are normal. Pupils are equal, round, and reactive to light.  Neck: Normal range of motion. Neck supple.   Cardiovascular: RRR without murmur. No JVD .  Pulmonary/Chest: CTA Bilaterally without wheezes or rales. Normal effort .  Abdominal: Soft. Bowel sounds are normal.  Musculoskeletal:  Lumbar back: lumbar ROM with improved flexion/extension.   Lb ttp along belt line.  Neurological: He is alert and oriented to person, place, and time.    Assessment & Plan:  ASSESSMENT:  1. Chronic low back pain  2. Degenerative disk disease lumbar spine.  3. OSA    PLAN:  1. Increase MS contin to  q12 #60. We will continue the opioid monitoring program, this consists of regular clinic visits, examinations, urine drug screen, pill counts as well as use of [place] Controlled Substance Reporting System. NCCS[place] was reviewed today.   2. Percocet 7.5/325 one p.o. q.6 h. p.r.n. 90. This was refilled today 3. My NP will see him in about 1 Month. 15 minutes of face to face patient care time were spent during this visit. All questions were encouraged and answered. Greater than 50% of time during this encounter was spent counseling patient/family in regard to pain mgt regimen, HEP. He will maintain weight loss efforts as well.

## 2016-11-25 NOTE — Patient Instructions (Signed)
PLEASE FEEL FREE TO CALL OUR OFFICE WITH ANY PROBLEMS OR QUESTIONS ([phone])      

## 2016-12-23 ENCOUNTER — Encounter: Payer: Self-pay | Admitting: Registered Nurse

## 2016-12-23 ENCOUNTER — Telehealth: Payer: Self-pay | Admitting: Registered Nurse

## 2016-12-23 ENCOUNTER — Encounter: Payer: BLUE CROSS/BLUE SHIELD | Attending: Physical Medicine & Rehabilitation | Admitting: Registered Nurse

## 2016-12-23 VITALS — BP 115/76 | HR 68

## 2016-12-23 DIAGNOSIS — Z5181 Encounter for therapeutic drug level monitoring: Secondary | ICD-10-CM | POA: Insufficient documentation

## 2016-12-23 DIAGNOSIS — Z79899 Other long term (current) drug therapy: Secondary | ICD-10-CM | POA: Diagnosis not present

## 2016-12-23 DIAGNOSIS — M545 Low back pain: Secondary | ICD-10-CM | POA: Insufficient documentation

## 2016-12-23 DIAGNOSIS — G894 Chronic pain syndrome: Secondary | ICD-10-CM

## 2016-12-23 DIAGNOSIS — M5116 Intervertebral disc disorders with radiculopathy, lumbar region: Secondary | ICD-10-CM | POA: Diagnosis not present

## 2016-12-23 MED ORDER — OXYCODONE-ACETAMINOPHEN 7.5-325 MG PO TABS: 1.0000 | ORAL_TABLET | Freq: Three times a day (TID) | ORAL | 0 refills | Status: DC | PRN

## 2016-12-23 MED ORDER — MORPHINE SULFATE ER 30 MG PO TBCR: 30.0000 mg | EXTENDED_RELEASE_TABLET | Freq: Two times a day (BID) | ORAL | 0 refills | Status: DC

## 2016-12-23 NOTE — Telephone Encounter (Signed)
On 12/23/2016 the NCCSR was reviewed no conflict was seen on the [facility] Controlled Substance Reporting System with multiple prescribers. [name]  has a signed narcotic contract with our office. If there were any discrepancies this would have been reported to his physician.

## 2016-12-23 NOTE — Progress Notes (Signed)
Subjective:    Patient ID: Joshua [name], male    DOB: June 04, 1975, 41 y.o.   MRN: [id number]  HPI: Joshua [name] is a 41year old male who returns for follow up appointmentfor chronic pain and medication refill. He states his pain is located in  his lower back occasionally radiatinginto his  lower extremitiesposteriorly with long periods of sitting and standing. He rates his pain 5. His current exercise regime is performing core exercises daily and walking.   Joshua [name] stated  his pain has been controlled with the increase of MS Contin.   Joshua [name] Morphine equivalent is 93.75  MME.    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  Pain Inventory Average Pain 4 Pain Right Now 5 My pain is sharp, stabbing, tingling and aching  In the last 24 hours, has pain interfered with the following? General activity 3 Relation with others 3 Enjoyment of life 3 What TIME of day is your pain at its worst? daytime, evening Sleep (in general) Fair  Pain is worse with: walking, sitting, standing and some activites Pain improves with: rest, therapy/exercise and medication Relief from Meds: 6  Mobility walk without assistance ability to climb steps?  yes do you drive?  yes Do you have any goals in this area?  no  Function employed # of hrs/week 30-40 Do you have any goals in this area?  no  Neuro/Psych No problems in this area  Prior Studies Any changes since last visit?  no  Physicians involved in your care Any changes since last visit?  no   Family History  Problem Relation Age of Onset  . Hypertension Mother   . Diabetes Father    Social History   Social History  . Marital status: Single    Spouse name: N/A  . Number of children: N/A  . 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 Smoking status: Former Smoker    Packs/day: 1.00    Years: 25.00    Quit date: 10/23/2013  . Smokeless  tobacco: Current User     Comment: Currently uses Vapor cig daily  . Alcohol use No  . Drug use: No  . Sexual activity: Yes    Birth control/ protection: Condom   Other Topics Concern  . None   Social History Narrative  . None   Past Surgical History:  Procedure Laterality Date  . KNEE SURGERY Left 2010   Past Medical History:  Diagnosis Date  . Disorders of sacrum   . Displacement of lumbar intervertebral disc without myelopathy   . Facet syndrome, lumbar   . Lumbago   . Thoracic or lumbosacral neuritis or radiculitis, unspecified    BP 115/76 (BP Location: Right Arm, Patient Position: Sitting, Cuff Size: Large)   Pulse 68   SpO2 97%   Opioid Risk Score:   Fall Risk Score:  `1  Depression screen PHQ 2/9  Depression screen [facility] 2/9 01/24/2016 11/27/2015 06/10/2014  Decreased Interest 0 0 0  Down, Depressed, Hopeless 0 0 0  PHQ - 2 Score 0 0 0  Altered sleeping - - 0  Tired, decreased energy - - 0  Change in appetite - - 0  Feeling bad or failure about yourself  - - 0  Trouble concentrating - - 0  Moving slowly or fidgety/restless - - 0  Suicidal thoughts - - 0  PHQ-9 Score - - 0    cardiopulmonary resuscitation     Review of Systems  Constitutional: Negative.   HENT: Negative.   Eyes: Negative.   Respiratory: Negative.   Cardiovascular: Negative.   Gastrointestinal: Negative.   Endocrine: Negative.   Genitourinary: Negative.   Musculoskeletal: Positive for back pain.  Skin: Negative.   Allergic/Immunologic: Negative.   Neurological: Negative.   Hematological: Negative.   Psychiatric/Behavioral: Negative.   All other systems reviewed and are negative.      Objective:   Physical Exam  Constitutional: He is oriented to person, place, and time. He appears well-developed and well-nourished.  HENT:  Head: Normocephalic and atraumatic.  Neck: Normal range of motion. Neck supple.  Cardiovascular: Normal rate and regular rhythm.   Pulmonary/Chest:  Effort normal and breath sounds normal.  Musculoskeletal:  Normal Muscle Bulk and Muscle Testing Reveals:  Upper Extremities: Full ROM and Muscle Strength 5/5 Lumbar Paraspinal Tenderness: L-4-L-5 Lower Extremities: Full ROM and Muscle Strength 5/5 Arises from Table with ease Narrow Based Gait  Neurological: He is alert and oriented to person, place, and time.  Skin: Skin is warm and dry.  Psychiatric: He has a normal mood and affect.  Nursing note and vitals reviewed.         Assessment & Plan:  1. Chronic low back pain/ Lumbar Radiculopathy: Continue with HomeExercise Regime. 12/23/2016 2. Degenerative disk disease lumbar spine: 12/23/2016  Refilled: Morphine ( MS Contin) 30 mg one tablet every 12 hours #60and Percocet 7.5/325 mg one tablet every 8 hours prn #90.  We will continue the opioid monitoring program, this consists of regular clinic visits, examinations, urine drug screen, pill counts as well as use of [place] Controlled Substance Reporting System.  15 minutes of face to face patient care time was spent during this visit. All questions were encouraged and answered.  F/U in 

## 2016-12-25 ENCOUNTER — Telehealth: Payer: Self-pay

## 2016-12-25 NOTE — Telephone Encounter (Signed)
initiated prior authorization

## 2016-12-25 NOTE — Telephone Encounter (Signed)
Patient called stating that his morphine refill requires prior authorization.

## 2016-12-27 ENCOUNTER — Telehealth: Payer: Self-pay

## 2016-12-27 NOTE — Telephone Encounter (Signed)
[place] called from [employer] requesting information for a prior authorization on patients morphine. He is sending a fax with a form to complete and fax back to [phone].

## 2016-12-27 NOTE — Telephone Encounter (Deleted)
All information sent with notes and opioid risk score [facility] Med[facility]

## 2016-12-30 NOTE — Telephone Encounter (Signed)
[place] to complete Prior auth on Morphine Sulfate ER.  I answered questions and no further information should be required for their decision.  She will send info to their pharmacy auth dept.

## 2017-01-01 NOTE — Telephone Encounter (Addendum)
[facility] [place] 12/25/16- 12/24/2017. Mr Mayford KnifeWilliams notified.

## 2017-01-22 ENCOUNTER — Encounter: Payer: BLUE CROSS/BLUE SHIELD | Attending: Physical Medicine & Rehabilitation | Admitting: Registered Nurse

## 2017-01-22 ENCOUNTER — Encounter: Payer: Self-pay | Admitting: Registered Nurse

## 2017-01-22 ENCOUNTER — Other Ambulatory Visit: Payer: Self-pay

## 2017-01-22 VITALS — BP 126/85 | HR 67

## 2017-01-22 DIAGNOSIS — M545 Low back pain: Secondary | ICD-10-CM | POA: Diagnosis not present

## 2017-01-22 DIAGNOSIS — Z79899 Other long term (current) drug therapy: Secondary | ICD-10-CM | POA: Diagnosis not present

## 2017-01-22 DIAGNOSIS — M5116 Intervertebral disc disorders with radiculopathy, lumbar region: Secondary | ICD-10-CM | POA: Diagnosis not present

## 2017-01-22 DIAGNOSIS — Z5181 Encounter for therapeutic drug level monitoring: Secondary | ICD-10-CM | POA: Diagnosis not present

## 2017-01-22 DIAGNOSIS — G894 Chronic pain syndrome: Secondary | ICD-10-CM

## 2017-01-22 MED ORDER — OXYCODONE-ACETAMINOPHEN 7.5-325 MG PO TABS: 1.0000 | ORAL_TABLET | Freq: Three times a day (TID) | ORAL | 0 refills | Status: DC | PRN

## 2017-01-22 MED ORDER — MORPHINE SULFATE ER 30 MG PO TBCR: 30.0000 mg | EXTENDED_RELEASE_TABLET | Freq: Two times a day (BID) | ORAL | 0 refills | Status: DC

## 2017-01-22 NOTE — Progress Notes (Signed)
Subjective:    Patient ID: Joshua DauerBrad D Smyre, male    DOB: 01/29/1976, 41 y.o   MRN: [id number]  HPI: [name] is a 41year old male who returns for follow up appointmentfor chronic pain and medication refill. He states his pain is located in  his lower back occasionally radiatinginto his left  lower extremityposteriorly with long periods of sitting and standing. He rates his pain 4. His current exercise regime is performing core exercises daily and walking.   [name] Morphine equivalent is 93.75  MME.    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  Pain Inventory Average Pain 4 Pain Right Now 4 My pain is sharp, stabbing and aching  In the last 24 hours, has pain interfered with the following? General activity 4 Relation with others 3 Enjoyment of life 3 What TIME of day is your pain at its worst? daytime, evening Sleep (in general) Fair  Pain is worse with: walking, sitting, standing and some activites Pain improves with: rest, therapy/exercise and medication Relief from Meds: 6  Mobility walk without assistance ability to climb steps?  yes do you drive?  yes Do you have any goals in this area?  no  Function employed # of hrs/week 30-40 Do you have any goals in this area?  no  Neuro/Psych No problems in this area  Prior Studies Any changes since last visit?  no  Physicians involved in your care Any changes since last visit?  no   Family History  Problem Relation Age of Onset  . Hypertension Mother   . Diabetes Father    Social History   Socioeconomic History  . Marital status: Single    Spouse name: None  . Number of children: None  . Years of education: None  . Highest education level: None  Social Needs  . Financial resource strain: None  . Food insecurity - worry: None  . Food insecurity - inability: None  . Transportation needs - medical: None  . Transportation needs - non-medical: None  Occupational History  .  Occupation: Biochemist, clinicalDump Truck Driver  Tobacco Use  . Smoking status: Former Smoker    Packs/day: 1.00    Years: 25.00    Pack years: 25.00    Last attempt to quit: 10/23/2013    Years since quitting: 3.2  . Smokeless tobacco: Current User  . Tobacco comment: Currently uses Vapor cig daily  Substance and Sexual Activity  . Alcohol use: No    Alcohol/week: 0.0 oz  . Drug use: No  . Sexual activity: Yes    Birth control/protection: Condom  Other Topics Concern  . None  Social History Narrative  . None   Past Surgical History:  Procedure Laterality Date  . KNEE SURGERY Left 2010   Past Medical History:  Diagnosis Date  . Disorders of sacrum   . Displacement of lumbar intervertebral disc without myelopathy   . Facet syndrome, lumbar   . Lumbago   . Thoracic or lumbosacral neuritis or radiculitis, unspecified    There were no vitals taken for this visit.  Opioid Risk Score:   Fall Risk Score:  `1  Depression screen PHQ 2/9  [facility] 2/[facility] 01/24/2016 11/27/2015 06/10/2014  Decreased Interest 0 0 0  Down, Depressed, Hopeless 0 0 0  PHQ - 2 Score 0 0 0  Altered sleeping - - 0  Tired, decreased energy - - 0  Change in appetite - - 0  Feeling bad or failure about yourself  - - 0  Trouble concentrating - - 0  Moving slowly or fidgety/restless - - 0  Suicidal thoughts - - 0  PHQ-9 Score - - 0    cardiopulmonary resuscitation     Review of Systems  Constitutional: Negative.   HENT: Negative.   Eyes: Negative.   Respiratory: Negative.   Cardiovascular: Negative.   Gastrointestinal: Negative.   Endocrine: Negative.   Genitourinary: Negative.   Musculoskeletal: Positive for back pain.  Skin: Negative.   Allergic/Immunologic: Negative.   Neurological: Negative.   Hematological: Negative.   Psychiatric/Behavioral: Negative.   All other systems reviewed and are negative.      Objective:   Physical Exam  Constitutional: He is oriented to person, place, and  time. He appears well-developed and well-nourished.  HENT:  Head: Normocephalic and atraumatic.  Neck: Normal range of motion. Neck supple.  Cardiovascular: Normal rate and regular rhythm.  Pulmonary/Chest: Effort normal and breath sounds normal.  Musculoskeletal:  Normal Muscle Bulk and Muscle Testing Reveals:  Upper Extremities: Full ROM and Muscle Strength 5/5 Lumbar Paraspinal Tenderness: L-3-L-5 Lower Extremities: Full ROM and Muscle Strength 5/5 Arises from Table with ease Narrow Based Gait  Neurological: He is alert and oriented to person, place, and time.  Skin: Skin is warm and dry.  Psychiatric: He has a normal mood and affect.  Nursing note and vitals reviewed.         Assessment & Plan:  1. Chronic low back pain/ Lumbar Radiculopathy: Continue with HomeExercise Regime. 01/22/2017 2. Degenerative disk disease lumbar spine: 01/22/2017  Refilled: Morphine ( MS Contin) 30 mg one tablet every 12 hours #60and Percocet 7.5/325 mg one tablet every 8 hours prn #90.  We will continue the opioid monitoring program, this consists of regular clinic visits, examinations, urine drug screen, pill counts as well as use of [place].  15 minutes of face to face patient care time was spent during this visit. All questions were encouraged and answered.  F/U in 33Month

## 2017-02-14 ENCOUNTER — Encounter: Payer: BLUE CROSS/BLUE SHIELD | Attending: Physical Medicine & Rehabilitation | Admitting: Registered Nurse

## 2017-02-14 ENCOUNTER — Encounter: Payer: Self-pay | Admitting: Registered Nurse

## 2017-02-14 VITALS — BP 119/78 | HR 71 | Resp 14

## 2017-02-14 DIAGNOSIS — M545 Low back pain: Secondary | ICD-10-CM | POA: Diagnosis not present

## 2017-02-14 DIAGNOSIS — Z5181 Encounter for therapeutic drug level monitoring: Secondary | ICD-10-CM | POA: Diagnosis not present

## 2017-02-14 DIAGNOSIS — Z79899 Other long term (current) drug therapy: Secondary | ICD-10-CM

## 2017-02-14 DIAGNOSIS — M5116 Intervertebral disc disorders with radiculopathy, lumbar region: Secondary | ICD-10-CM | POA: Diagnosis not present

## 2017-02-14 DIAGNOSIS — G894 Chronic pain syndrome: Secondary | ICD-10-CM | POA: Diagnosis not present

## 2017-02-14 DIAGNOSIS — M5416 Radiculopathy, lumbar region: Secondary | ICD-10-CM

## 2017-02-14 MED ORDER — MORPHINE SULFATE ER 30 MG PO TBCR: 30.0000 mg | EXTENDED_RELEASE_TABLET | Freq: Two times a day (BID) | ORAL | 0 refills | Status: DC

## 2017-02-14 MED ORDER — OXYCODONE-ACETAMINOPHEN 7.5-325 MG PO TABS: 1.0000 | ORAL_TABLET | Freq: Three times a day (TID) | ORAL | 0 refills | Status: DC | PRN

## 2017-02-14 NOTE — Progress Notes (Signed)
Subjective:    Patient ID: Joshua [name], male    DOB: 10-20-1975, 41 y.o.   MRN: [id number]  HPI: Joshua [name] is a [age]year old male who returns for follow up appointmentfor chronic pain and medication refill. He states his pain is located in  his lower back occasionally radiatinginto his left  lower extremity with long periods of sitting and standing. He rates his pain 5. His current exercise regime is performing core exercises daily and walking.   [name] Morphine equivalent is 93.75  MME.    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  Pain Inventory Average Pain 5 Pain Right Now 5 My pain is sharp, stabbing and aching  In the last 24 hours, has pain interfered with the following? General activity 4 Relation with others 3 Enjoyment of life 2 What TIME of day is your pain at its worst? daytime, evening  Sleep (in general) Fair  Pain is worse with: walking, sitting, standing and some activites Pain improves with: rest and medication Relief from Meds: 7  Mobility walk without assistance how many minutes can you walk? 30 ability to climb steps?  yes do you drive?  yes transfers alone Do you have any goals in this area?  no  Function employed # of hrs/week 30-40 Do you have any goals in this area?  no  Neuro/Psych No problems in this area  Prior Studies Any changes since last visit?  no  Physicians involved in your care Any changes since last visit?  no   Family History  Problem Relation Age of Onset  . Hypertension Mother   . Diabetes Father    Social History   Socioeconomic History  . Marital status: Single    Spouse name: None  . Number of children: None  . Years of education: None  . Highest education level: None  Social Needs  . Financial resource strain: None  . Food insecurity - worry: None  . Food insecurity - inability: None  . Transportation needs - medical: None  . Transportation needs - non-medical: None    Occupational History  . Occupation: Biochemist, clinicalDump Truck Driver  Tobacco Use  . Smoking status: Former Smoker    Packs/day: 1.00    Years: 25.00    Pack years: 25.00    Last attempt to quit: 10/23/2013    Years since quitting: 3.3  . Smokeless tobacco: Current User  . Tobacco comment: Currently uses Vapor cig daily  Substance and Sexual Activity  . Alcohol use: No    Alcohol/week: 0.0 oz  . Drug use: No  . Sexual activity: Yes    Birth control/protection: Condom  Other Topics Concern  . None  Social History Narrative  . None   Past Surgical History:  Procedure Laterality Date  . KNEE SURGERY Left 2010   Past Medical History:  Diagnosis Date  . Disorders of sacrum   . Displacement of lumbar intervertebral disc without myelopathy   . Facet syndrome, lumbar   . Lumbago   . Thoracic or lumbosacral neuritis or radiculitis, unspecified    BP 119/78 (BP Location: Left Arm, Patient Position: Sitting, Cuff Size: Large)   Pulse 71   Resp 14   SpO2 96%   Opioid Risk Score:   Fall Risk Score:  `1  Depression screen PHQ 2/9  [facility] screen [facility] 2/9 01/24/2016 11/27/2015 06/10/2014  Decreased Interest 0 0 0  Down, Depressed, Hopeless 0 0 0  PHQ -  2 Score 0 0 0  Altered sleeping - - 0  Tired, decreased energy - - 0  Change in appetite - - 0  Feeling bad or failure about yourself  - - 0  Trouble concentrating - - 0  Moving slowly or fidgety/restless - - 0  Suicidal thoughts - - 0  PHQ-9 Score - - 0    cardiopulmonary resuscitation     Review of Systems  Constitutional: Negative.   HENT: Negative.   Eyes: Negative.   Respiratory: Negative.   Cardiovascular: Negative.   Gastrointestinal: Negative.   Endocrine: Negative.   Genitourinary: Negative.   Musculoskeletal: Positive for back pain.  Skin: Negative.   Allergic/Immunologic: Negative.   Neurological: Negative.   Hematological: Negative.   Psychiatric/Behavioral: Negative.   All other systems reviewed and are  negative.      Objective:   Physical Exam  Constitutional: He is oriented to person, place, and time. He appears well-developed and well-nourished.  HENT:  Head: Normocephalic and atraumatic.  Neck: Normal range of motion. Neck supple.  Cardiovascular: Normal rate and regular rhythm.  Pulmonary/Chest: Effort normal and breath sounds normal.  Musculoskeletal:  Normal Muscle Bulk and Muscle Testing Reveals:  Upper Extremities: Full ROM and Muscle Strength 5/5 Lumbar Paraspinal Tenderness: L-3-L-5 Lower Extremities: Full ROM and Muscle Strength 5/5 Arises from Table with ease Narrow Based Gait  Neurological: He is alert and oriented to person, place, and time.  Skin: Skin is warm and dry.  Psychiatric: He has a normal mood and affect.  Nursing note and vitals reviewed.         Assessment & Plan:  1. Chronic low back pain/ Lumbar Radiculopathy: Continue with HomeExercise Regime. 02/14/2017 2. Degenerative disk disease lumbar spine: 02/14/2017  Refilled: Morphine ( MS Contin) 30 mg one tablet every 12 hours #60and Percocet 7.5/325 mg one tablet every 8 hours prn #90.  We will continue the opioid monitoring program, this consists of regular clinic visits, examinations, urine drug screen, pill counts as well as use of [place] Cont[place] Substance Reporting [place].  15 minutes of face to face patient care time was spent during this visit. All questions were encouraged and answered.  F/U in 29Month

## 2017-02-17 ENCOUNTER — Ambulatory Visit: Payer: BLUE CROSS/BLUE SHIELD | Admitting: Registered Nurse

## 2017-03-12 ENCOUNTER — Encounter: Payer: BLUE CROSS/BLUE SHIELD | Attending: Physical Medicine & Rehabilitation | Admitting: Registered Nurse

## 2017-03-12 ENCOUNTER — Encounter: Payer: Self-pay | Admitting: Registered Nurse

## 2017-03-12 VITALS — BP 127/82 | HR 71 | Resp 14

## 2017-03-12 DIAGNOSIS — M545 Low back pain: Secondary | ICD-10-CM | POA: Insufficient documentation

## 2017-03-12 DIAGNOSIS — Z79899 Other long term (current) drug therapy: Secondary | ICD-10-CM

## 2017-03-12 DIAGNOSIS — Z5181 Encounter for therapeutic drug level monitoring: Secondary | ICD-10-CM | POA: Insufficient documentation

## 2017-03-12 DIAGNOSIS — M5416 Radiculopathy, lumbar region: Secondary | ICD-10-CM | POA: Diagnosis not present

## 2017-03-12 DIAGNOSIS — M5116 Intervertebral disc disorders with radiculopathy, lumbar region: Secondary | ICD-10-CM | POA: Diagnosis not present

## 2017-03-12 DIAGNOSIS — G894 Chronic pain syndrome: Secondary | ICD-10-CM

## 2017-03-12 MED ORDER — MORPHINE SULFATE ER 30 MG PO TBCR: 30.0000 mg | EXTENDED_RELEASE_TABLET | Freq: Two times a day (BID) | ORAL | 0 refills | Status: DC

## 2017-03-12 MED ORDER — OXYCODONE-ACETAMINOPHEN 7.5-325 MG PO TABS: 1.0000 | ORAL_TABLET | Freq: Three times a day (TID) | ORAL | 0 refills | Status: DC | PRN

## 2017-03-12 NOTE — Progress Notes (Signed)
Subjective:    Patient ID: Joshua [name], male    DOB: 06-Dec-1975, 42 y.o.   MRN: [id number]  HPI: [name] is a 42year old male who returns for follow up appointmentfor chronic pain and medication refill. He states his pain is located in  his lower back occasionally radiatinginto his left  lower extremity with long periods of sitting and standing. He rates his pain 5. His current exercise regime is performing core exercises daily and walking.   Joshua [name] Morphine equivalent is 93.75  MME.    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  Pain Inventory Average Pain 4 Pain Right Now 5 My pain is sharp, stabbing and aching  In the last 24 hours, has pain interfered with the following? General activity 4 Relation with others 3 Enjoyment of life 4 What TIME of day is your pain at its worst? daytime, evening  Sleep (in general) Fair  Pain is worse with: walking, bending, sitting, standing and some activites Pain improves with: rest, therapy/exercise and medication Relief from Meds: 6  Mobility walk without assistance how many minutes can you walk? 30 ability to climb steps?  yes do you drive?  yes transfers alone Do you have any goals in this area?  no  Function employed # of hrs/week 30-40 Do you have any goals in this area?  no  Neuro/Psych No problems in this area  Prior Studies Any changes since last visit?  no  Physicians involved in your care Any changes since last visit?  no   Family History  Problem Relation Age of Onset  . Hypertension Mother   . Diabetes Father    Social History   Socioeconomic History  . Marital status: Single    Spouse name: None  . Number of children: None  . Years of education: None  . Highest education level: None  Social Needs  . Financial resource strain: None  . Food insecurity - worry: None  . Food insecurity - inability: None  . Transportation needs - medical: None  . Transportation  needs - non-medical: None  Occupational History  . Occupation: Biochemist, clinical  Tobacco Use  . Smoking status: Former Smoker    Packs/day: 1.00    Years: 25.00    Pack years: 25.00    Last attempt to quit: 10/23/2013    Years since quitting: 3.3  . Smokeless tobacco: Current User  . Tobacco comment: Currently uses Vapor cig daily  Substance and Sexual Activity  . Alcohol use: No    Alcohol/week: 0.0 oz  . Drug use: No  . Sexual activity: Yes    Birth control/protection: Condom  Other Topics Concern  . None  Social History Narrative  . None   Past Surgical History:  Procedure Laterality Date  . KNEE SURGERY Left 2010   Past Medical History:  Diagnosis Date  . Disorders of sacrum   . Displacement of lumbar intervertebral disc without myelopathy   . Facet syndrome, lumbar   . Lumbago   . Thoracic or lumbosacral neuritis or radiculitis, unspecified    BP 127/82 (BP Location: Right Arm, Patient Position: Sitting, Cuff Size: Large)   Pulse 71   Resp 14   SpO2 96%   Opioid Risk Score:   Fall Risk Score:  `1  Depression screen PHQ 2/9  Depression screen [facility] 2/9 01/24/2016 11/27/2015 06/10/2014  Decreased Interest 0 0 0  Down, Depressed, Hopeless 0 0 0  PHQ - 2 Score 0 0 0  Altered sleeping - - 0  Tired, decreased energy - - 0  Change in appetite - - 0  Feeling bad or failure about yourself  - - 0  Trouble concentrating - - 0  Moving slowly or fidgety/restless - - 0  Suicidal thoughts - - 0  PHQ-9 Score - - 0    cardiopulmonary resuscitation     Review of Systems  Constitutional: Negative.   HENT: Negative.   Eyes: Negative.   Respiratory: Negative.   Cardiovascular: Negative.   Gastrointestinal: Negative.   Endocrine: Negative.   Genitourinary: Negative.   Musculoskeletal: Positive for back pain.  Skin: Negative.   Allergic/Immunologic: Negative.   Neurological: Negative.   Hematological: Negative.   Psychiatric/Behavioral: Negative.   All other  systems reviewed and are negative.      Objective:   Physical Exam  Constitutional: He is oriented to person, place, and time. He appears well-developed and well-nourished.  HENT:  Head: Normocephalic and atraumatic.  Neck: Normal range of motion. Neck supple.  Cardiovascular: Normal rate and regular rhythm.  Pulmonary/Chest: Effort normal and breath sounds normal.  Musculoskeletal:  Normal Muscle Bulk and Muscle Testing Reveals:  Upper Extremities: Full ROM and Muscle Strength 5/5 Lumbar Paraspinal Tenderness: L-4-L-5 Lower Extremities: Full ROM and Muscle Strength 5/5 Arises from Table with ease Narrow Based Gait  Neurological: He is alert and oriented to person, place, and time.  Skin: Skin is warm and dry.  Psychiatric: He has a normal mood and affect.  Nursing note and vitals reviewed.         Assessment & Plan:  1. Chronic low back pain/ Lumbar Radiculopathy: Continue with HomeExercise Regime. 0102/2019 2. Degenerative disk disease lumbar spine: 03/12/2017  Refilled: Morphine ( MS Contin) 30 mg one tablet every 12 hours #60and Percocet 7.5/325 mg one tablet every 8 hours prn #90.  We will continue the opioid monitoring program, this consists of regular clinic visits, examinations, urine drug screen, pill counts as well as use of [place] Controlled Substance Reporting System.  15 minutes of face to face patient care time was spent during this visit. All questions were encouraged and answered.  F/U in 95Month

## 2017-03-15 LAB — 6-ACETYLMORPHINE,TOXASSURE ADD
6-ACETYLMORPHINE: NEGATIVE
6-acetylmorphine: NOT DETECTED ng/mg creat

## 2017-03-15 LAB — TOXASSURE SELECT,+ANTIDEPR,UR

## 2017-03-17 ENCOUNTER — Telehealth: Payer: Self-pay | Admitting: *Deleted

## 2017-03-17 NOTE — Telephone Encounter (Signed)
Urine drug screen for this encounter is consistent for prescribed medication 

## 2017-04-09 ENCOUNTER — Other Ambulatory Visit: Payer: Self-pay

## 2017-04-09 ENCOUNTER — Encounter (HOSPITAL_BASED_OUTPATIENT_CLINIC_OR_DEPARTMENT_OTHER): Payer: BLUE CROSS/BLUE SHIELD | Admitting: Registered Nurse

## 2017-04-09 ENCOUNTER — Encounter: Payer: Self-pay | Admitting: Registered Nurse

## 2017-04-09 VITALS — BP 127/87 | HR 66

## 2017-04-09 DIAGNOSIS — Z79899 Other long term (current) drug therapy: Secondary | ICD-10-CM | POA: Diagnosis not present

## 2017-04-09 DIAGNOSIS — M545 Low back pain: Secondary | ICD-10-CM | POA: Diagnosis not present

## 2017-04-09 DIAGNOSIS — M5116 Intervertebral disc disorders with radiculopathy, lumbar region: Secondary | ICD-10-CM

## 2017-04-09 DIAGNOSIS — G894 Chronic pain syndrome: Secondary | ICD-10-CM

## 2017-04-09 DIAGNOSIS — Z5181 Encounter for therapeutic drug level monitoring: Secondary | ICD-10-CM

## 2017-04-09 MED ORDER — MORPHINE SULFATE ER 30 MG PO TBCR: 30.0000 mg | EXTENDED_RELEASE_TABLET | Freq: Two times a day (BID) | ORAL | 0 refills | Status: DC

## 2017-04-09 MED ORDER — OXYCODONE-ACETAMINOPHEN 7.5-325 MG PO TABS: 1.0000 | ORAL_TABLET | Freq: Three times a day (TID) | ORAL | 0 refills | Status: DC | PRN

## 2017-04-09 NOTE — Progress Notes (Signed)
Subjective:    Patient ID: Joshua [name], male    DOB: October 08, 1975, 42 y.o.   MRN: [id number]  HPI: Mr. WYNN ALLDREDGE is a 42year old male who returns for follow up appointmentfor chronic pain and medication refill. He states his pain is located in his lower back and he reports occasionally radiates into his left lower extremity. He rates his pain 5. His current exercise regime is walking.    Mr. Bonet Morphine equivalent is 91.50  MME.    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  Pain Inventory Average Pain 4 Pain Right Now 5 My pain is sharp, stabbing and aching  In the last 24 hours, has pain interfered with the following? General activity 4 Relation with others 4 Enjoyment of life 4 What TIME of day is your pain at its worst? daytime, evening  Sleep (in general) Fair  Pain is worse with: walking, bending, sitting, standing and some activites Pain improves with: rest, therapy/exercise and medication Relief from Meds: 6  Mobility walk without assistance how many minutes can you walk? 30 ability to climb steps?  yes do you drive?  yes transfers alone Do you have any goals in this area?  no  Function employed # of hrs/week 30-40 Do you have any goals in this area?  no  Neuro/Psych No problems in this area  Prior Studies Any changes since last visit?  no  Physicians involved in your care Any changes since last visit?  no   Family History  Problem Relation Age of Onset  . Hypertension Mother   . Diabetes Father    Social History   Socioeconomic History  . Marital status: Single    Spouse name: None  . Number of children: None  . Years of education: None  . Highest education level: None  Social Needs  . Financial resource strain: None  . Food insecurity - worry: None  . Food insecurity - inability: None  . Transportation needs - medical: None  . Transportation needs - non-medical: None  Occupational History  . Occupation: Contractor  Tobacco Use  . Smoking status: Former Smoker    Packs/day: 1.00    Years: 25.00    Pack years: 25.00    Last attempt to quit: 10/23/2013    Years since quitting: 3.4  . Smokeless tobacco: Current User  . Tobacco comment: Currently uses Vapor cig daily  Substance and Sexual Activity  . Alcohol use: No    Alcohol/week: 0.0 oz  . Drug use: No  . Sexual activity: Yes    Birth control/protection: Condom  Other Topics Concern  . None  Social History Narrative  . None   Past Surgical History:  Procedure Laterality Date  . KNEE SURGERY Left 2010   Past Medical History:  Diagnosis Date  . Disorders of sacrum   . Displacement of lumbar intervertebral disc without myelopathy   . Facet syndrome, lumbar   . Lumbago   . Thoracic or lumbosacral neuritis or radiculitis, unspecified    BP (!) 145/90   Pulse 70   SpO2 96%   Opioid Risk Score:  1 Fall Risk Score:  `1  Depression screen PHQ 2/9  Depression screen [facility] 2/9 04/09/2017 01/24/2016 11/27/2015 06/10/2014  Decreased Interest 0 0 0 0  Down, Depressed, Hopeless 0 0 0 0  PHQ - 2 Score 0 0 0 0  Altered sleeping - - - 0  Tired, decreased energy - - -  0  Change in appetite - - - 0  Feeling bad or failure about yourself  - - - 0  Trouble concentrating - - - 0  Moving slowly or fidgety/restless - - - 0  Suicidal thoughts - - - 0  PHQ-9 Score - - - 0    cardiopulmonary resuscitation     Review of Systems  Constitutional: Negative.   HENT: Negative.   Eyes: Negative.   Respiratory: Negative.   Cardiovascular: Negative.   Gastrointestinal: Negative.   Endocrine: Negative.   Genitourinary: Negative.   Musculoskeletal: Positive for back pain.  Skin: Negative.   Allergic/Immunologic: Negative.   Neurological: Negative.   Hematological: Negative.   Psychiatric/Behavioral: Negative.   All other systems reviewed and are negative.      Objective:   Physical Exam  Constitutional: He is oriented to person,  place, and time. He appears well-developed and well-nourished.  HENT:  Head: Normocephalic and atraumatic.  Neck: Normal range of motion. Neck supple.  Cardiovascular: Normal rate and regular rhythm.  Pulmonary/Chest: Effort normal and breath sounds normal.  Musculoskeletal:  Normal Muscle Bulk and Muscle Testing Reveals: Upper Extremities: Full ROM and Muscle Strength 5/5 Lumbar Paraspinal Tenderness: L-4-L-5 Lower Extremities: Full  ROM and Muscle Strength 5/5 Arises from Table with ease Narrow Based Gait  Neurological: He is alert and oriented to person, place, and time.  Skin: Skin is warm and dry.  Psychiatric: He has a normal mood and affect.  Nursing note and vitals reviewed.         Assessment & Plan:  1. Chronic low back pain/ Lumbar Radiculopathy: Continue with HomeExercise Regime. 04/09/2017 2. Degenerative disk disease lumbar spine: 04/09/2017  Refilled: Morphine ( MS Contin) 30 mg one tablet every 12 hours #60and Percocet 7.5/325 mg one tablet every 8 hours prn #90.  We will continue the opioid monitoring program, this consists of regular clinic visits, examinations, urine drug screen, pill counts as well as use of [place] Controlled Substance Reporting System.  15 minutes of face to face patient care time was spent during this visit. All questions were encouraged and answered.  F/U in 61Month

## 2017-05-09 ENCOUNTER — Encounter: Payer: Self-pay | Admitting: Registered Nurse

## 2017-05-09 ENCOUNTER — Encounter: Payer: BLUE CROSS/BLUE SHIELD | Attending: Physical Medicine & Rehabilitation | Admitting: Registered Nurse

## 2017-05-09 VITALS — BP 145/82 | HR 70

## 2017-05-09 DIAGNOSIS — Z5181 Encounter for therapeutic drug level monitoring: Secondary | ICD-10-CM | POA: Insufficient documentation

## 2017-05-09 DIAGNOSIS — M545 Low back pain: Secondary | ICD-10-CM | POA: Diagnosis not present

## 2017-05-09 DIAGNOSIS — M5116 Intervertebral disc disorders with radiculopathy, lumbar region: Secondary | ICD-10-CM | POA: Insufficient documentation

## 2017-05-09 DIAGNOSIS — G894 Chronic pain syndrome: Secondary | ICD-10-CM | POA: Diagnosis not present

## 2017-05-09 DIAGNOSIS — Z79899 Other long term (current) drug therapy: Secondary | ICD-10-CM | POA: Diagnosis not present

## 2017-05-09 MED ORDER — OXYCODONE-ACETAMINOPHEN 7.5-325 MG PO TABS: 1.0000 | ORAL_TABLET | Freq: Three times a day (TID) | ORAL | 0 refills | Status: DC | PRN

## 2017-05-09 MED ORDER — MORPHINE SULFATE ER 30 MG PO TBCR: 30.0000 mg | EXTENDED_RELEASE_TABLET | Freq: Two times a day (BID) | ORAL | 0 refills | Status: DC

## 2017-05-09 NOTE — Progress Notes (Signed)
Subjective:    Patient ID: Joshua [name], male    DOB: 02-Dec-1975, 42 y.o.   MRN: [id number]  HPI: [name] is a 42year old male who returns for follow up appointmentfor chronic pain and medication refill. He states his pain is located in his lower back and it occasionally radiates into his left lower extremity He rates his pain 5. His current exercise regime is walking and performing stretching exercises.  Mr. Maybee Morphine equivalent is 98.00  MME.    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  Pain Inventory Average Pain 4 Pain Right Now 5 My pain is sharp, stabbing and aching  In the last 24 hours, has pain interfered with the following? General activity 4 Relation with others 4 Enjoyment of life 3 What TIME of day is your pain at its worst? daytime, evening  Sleep (in general) Fair  Pain is worse with: walking, bending, sitting, standing and some activites Pain improves with: rest, therapy/exercise and medication Relief from Meds: 6  Mobility walk without assistance how many minutes can you walk? 30 ability to climb steps?  yes do you drive?  yes transfers alone Do you have any goals in this area?  no  Function employed # of hrs/week 30-40 Do you have any goals in this area?  no  Neuro/Psych No problems in this area  Prior Studies Any changes since last visit?  no  Physicians involved in your care Any changes since last visit?  no   Family History  Problem Relation Age of Onset  . Hypertension Mother   . Diabetes Father    Social History   Socioeconomic History  . Marital status: Single    Spouse name: Not on file  . Number of children: Not on file  . Years of education: Not on file  . Highest education level: Not on file  Social Needs  . Financial resource strain: Not on file  . Food insecurity - worry: Not on file  . Food insecurity - inability: Not on file  . Transportation needs - medical: Not on file  .  Transportation needs - non-medical: Not on file  Occupational History  . Occupation: Biochemist, clinical  Tobacco Use  . Smoking status: Former Smoker    Packs/day: 1.00    Years: 25.00    Pack years: 25.00    Last attempt to quit: 10/23/2013    Years since quitting: 3.5  . Smokeless tobacco: Current User  . Tobacco comment: Currently uses Vapor cig daily  Substance and Sexual Activity  . Alcohol use: No    Alcohol/week: 0.0 oz  . Drug use: No  . Sexual activity: Yes    Birth control/protection: Condom  Other Topics Concern  . Not on file  Social History Narrative  . Not on file   Past Surgical History:  Procedure Laterality Date  . KNEE SURGERY Left 2010   Past Medical History:  Diagnosis Date  . Disorders of sacrum   . Displacement of lumbar intervertebral disc without myelopathy   . Facet syndrome, lumbar   . Lumbago   . Thoracic or lumbosacral neuritis or radiculitis, unspecified    There were no vitals taken for this visit.  Opioid Risk Score:  1 Fall Risk Score:  `1  Depression screen PHQ 2/9  Depression screen [facility] 2/9 04/09/2017 01/24/2016 11/27/2015 06/10/2014  Decreased Interest 0 0 0 0  Down, Depressed, Hopeless 0 0 0 0  PHQ - 2 Score 0 0 0 0  Altered sleeping - - - 0  Tired, decreased energy - - - 0  Change in appetite - - - 0  Feeling bad or failure about yourself  - - - 0  Trouble concentrating - - - 0  Moving slowly or fidgety/restless - - - 0  Suicidal thoughts - - - 0  PHQ-9 Score - - - 0    cardiopulmonary resuscitation     Review of Systems  Constitutional: Negative.   HENT: Negative.   Eyes: Negative.   Respiratory: Negative.   Cardiovascular: Negative.   Gastrointestinal: Negative.   Endocrine: Negative.   Genitourinary: Negative.   Musculoskeletal: Positive for arthralgias, back pain and myalgias.  Skin: Negative.   Allergic/Immunologic: Negative.   Neurological: Negative.   Hematological: Negative.   Psychiatric/Behavioral:  Negative.   All other systems reviewed and are negative.      Objective:   Physical Exam  Constitutional: He is oriented to person, place, and time. He appears well-developed and well-nourished.  HENT:  Head: Normocephalic and atraumatic.  Neck: Normal range of motion. Neck supple.  Cardiovascular: Normal rate and regular rhythm.  Pulmonary/Chest: Effort normal and breath sounds normal.  Musculoskeletal:  Normal Muscle Bulk and Muscle Testing Reveals: Upper Extremities: Full ROM and Muscle Strength 5/5 Lumbar Paraspinal Tenderness: L-3-L-5 Lower Extremities: Full  ROM and Muscle Strength 5/5 Arises from Table with ease Narrow Based Gait  Neurological: He is alert and oriented to person, place, and time.  Psychiatric: He has a normal mood and affect.  Nursing note and vitals reviewed.         Assessment & Plan:  1. Chronic low back pain/ Lumbar Radiculopathy: Continue with HomeExercise Regime. 05/09/2017 2. Degenerative disk disease lumbar spine: 05/09/2017  Refilled: Morphine ( MS Contin) 30 mg one tablet every 12 hours #60and Percocet 7.5/325 mg one tablet every 8 hours prn #90.  We will continue the opioid monitoring program, this consists of regular clinic visits, examinations, urine drug screen, pill counts as well as use of [place] Controlled Substance Reporting System.  15 minutes of face to face patient care time was spent during this visit. All questions were encouraged and answered.  F/U in 103Month

## 2017-06-10 ENCOUNTER — Encounter
Payer: BLUE CROSS/BLUE SHIELD | Attending: Physical Medicine & Rehabilitation | Admitting: Physical Medicine & Rehabilitation

## 2017-06-10 ENCOUNTER — Encounter: Payer: Self-pay | Admitting: Physical Medicine & Rehabilitation

## 2017-06-10 VITALS — BP 127/85 | HR 65 | Ht 68.5 in | Wt 261.6 lb

## 2017-06-10 DIAGNOSIS — Z79899 Other long term (current) drug therapy: Secondary | ICD-10-CM | POA: Diagnosis not present

## 2017-06-10 DIAGNOSIS — M5116 Intervertebral disc disorders with radiculopathy, lumbar region: Secondary | ICD-10-CM | POA: Insufficient documentation

## 2017-06-10 DIAGNOSIS — Z5181 Encounter for therapeutic drug level monitoring: Secondary | ICD-10-CM | POA: Diagnosis not present

## 2017-06-10 DIAGNOSIS — M545 Low back pain: Secondary | ICD-10-CM | POA: Insufficient documentation

## 2017-06-10 MED ORDER — OXYCODONE-ACETAMINOPHEN 7.5-325 MG PO TABS: 1.0000 | ORAL_TABLET | Freq: Three times a day (TID) | ORAL | 0 refills | Status: DC | PRN

## 2017-06-10 MED ORDER — MORPHINE SULFATE ER 30 MG PO TBCR: 30.0000 mg | EXTENDED_RELEASE_TABLET | Freq: Two times a day (BID) | ORAL | 0 refills | Status: DC

## 2017-06-10 MED ORDER — MORPHINE SULFATE ER 30 MG PO TBCR
30.0000 mg | EXTENDED_RELEASE_TABLET | Freq: Two times a day (BID) | ORAL | 0 refills | Status: DC
Start: 2017-06-10 — End: 2017-08-13

## 2017-06-10 NOTE — Progress Notes (Signed)
Subjective:    Patient ID: Joshua [name], male    DOB: 08/23/1975, 42 y.o.   MRN: [id number]  HPI   Nida BoatmanBrad is here in follow up of his low back pain.  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  He is working about 30-40 hours a week.  He does take time to try to stretch at work and make sure he is using proper seating.  Changed to MS Contin seems to have helped his back.  He is using it twice daily along with his Percocet for breakthrough pain.  Pain Inventory Average Pain 4 Pain Right Now 4 My pain is sharp, stabbing and aching  In the last 24 hours, has pain interfered with the following? General activity 5 Relation with others 4 Enjoyment of life 3 What TIME of day is your pain at its worst? daytime Sleep (in general) Good  Pain is worse with: walking, sitting, standing and some activites Pain improves with: rest, therapy/exercise and medication Relief from Meds: 7  Mobility walk without assistance ability to climb steps?  yes do you drive?  yes  Function employed # of hrs/week 40  Neuro/Psych No problems in this area  Prior Studies Any changes since last visit?  no  Physicians involved in your care Any changes since last visit?  no   Family History  Problem Relation Age of Onset  . Hypertension Mother   . Diabetes Father    Social History   Socioeconomic History  . Marital status: Single    Spouse name: Not on file  . Number of children: Not on file  . Years of education: Not on file  . Highest education level: Not on file  Occupational History  . Occupation: Biochemist, clinicalDump Truck Driver  Social Needs  . Financial resource strain: Not on file  . Food insecurity:    Worry: Not on file    Inability: Not on file  . Transportation needs:    Medical: Not on file    Non-medical: Not on file  Tobacco Use  . Smoking status: Former Smoker    Packs/day: 1.00    Years: 25.00    Pack years: 25.00      Last attempt to quit: 10/23/2013    Years since quitting: 3.6  . Smokeless tobacco: Current User  . Tobacco comment: Currently uses Vapor cig daily  Substance and Sexual Activity  . Alcohol use: No    Alcohol/week: 0.0 oz  . Drug use: No  . Sexual activity: Yes    Birth control/protection: Condom  Lifestyle  . Physical activity:    Days per week: Not on file    Minutes per session: Not on file  . Stress: Not on file  Relationships  . Social connections:    Talks on phone: Not on file    Gets together: Not on file    Attends religious service: Not on file    Active member of club or organization: Not on file    Attends meetings of clubs or organizations: Not on file    Relationship status: Not on file  Other Topics Concern  . Not on file  Social History Narrative  . Not on file   Past Surgical History:  Procedure Laterality Date  . KNEE SURGERY Left 2010   Past Medical History:  Diagnosis Date  . Disorders of sacrum   . Displacement of lumbar intervertebral disc without myelopathy   .  Facet syndrome, lumbar   . Lumbago   . Thoracic or lumbosacral neuritis or radiculitis, unspecified    There were no vitals taken for this visit.  Opioid Risk Score:   Fall Risk Score:  `1  Depression screen PHQ 2/9  Depression screen [facility] 2/9 04/09/2017 01/24/2016 11/27/2015 06/10/2014  Decreased Interest 0 0 0 0  Down, Depressed, Hopeless 0 0 0 0  PHQ - 2 Score 0 0 0 0  Altered sleeping - - - 0  Tired, decreased energy - - - 0  Change in appetite - - - 0  Feeling bad or failure about yourself  - - - 0  Trouble concentrating - - - 0  Moving slowly or fidgety/restless - - - 0  Suicidal thoughts - - - 0  PHQ-9 Score - - - 0     Review of Systems  Constitutional: Negative.   HENT: Negative.   Eyes: Negative.   Respiratory: Negative.   Cardiovascular: Negative.   Gastrointestinal: Negative.   Endocrine: Negative.   Genitourinary: Negative.   Musculoskeletal: Positive for  arthralgias, back pain and myalgias.  Skin: Negative.   Allergic/Immunologic: Negative.   Neurological: Negative.   Hematological: Negative.   Psychiatric/Behavioral: Negative.   All other systems reviewed and are negative.      Objective:   Physical Exam General: No acute distress HEENT: EOMI, oral membranes moist Cards: reg rate  Chest: normal effort Abdomen: Soft, NT, ND Skin: dry, intact Extremities: no edema  Cardiovascular: RRR without murmur. No JVD .  Pulmonary/Chest: CTA Bilaterally without wheezes or rales. Normal effort .  Abdominal: Soft. Bowel sounds are normal.  Musculoskeletal:  Lumbar back: Mild pain with palpation to the lower lumbar spine.  Seem to have more pain with extension than flexion although both cause discomfort.  Rotation lateral bending of the spine were minimally limited today. Neurological: He is alert and oriented to person, place, and time.    Assessment & Plan:  ASSESSMENT:  1. Chronic low back pain  2. Degenerative disk disease lumbar spine.  3. OSA    PLAN:  1.   Maintain MS Contin at 30mg  q12 #60. We will continue the opioid monitoring program, this consists of regular clinic visits, examinations, routine drug screening, pill counts as well as use of [place] Controlled Substance Reporting System. [place] was reviewed today.   -second rx'es were given for next month.   2. Percocet 7.5/325 one p.o. q.6 h. p.r.n. 90. This was refilled today with second for next month as well. 3. My NP will see him in about . 10 minutes of face to face patient care time were spent during this visit. All questions were encouraged and answered.

## 2017-06-10 NOTE — Patient Instructions (Addendum)
PLEASE FEEL FREE TO CALL OUR OFFICE WITH ANY PROBLEMS OR QUESTIONS [phone])     KEEP UP YOUR STRETCHING.

## 2017-07-14 ENCOUNTER — Ambulatory Visit: Payer: BLUE CROSS/BLUE SHIELD | Admitting: Family

## 2017-07-14 ENCOUNTER — Encounter: Payer: Self-pay | Admitting: Family

## 2017-07-14 VITALS — BP 123/80 | HR 74 | Temp 99.0°F | Ht 68.5 in | Wt 254.6 lb

## 2017-07-14 DIAGNOSIS — Z720 Tobacco use: Secondary | ICD-10-CM

## 2017-07-14 DIAGNOSIS — R6889 Other general symptoms and signs: Secondary | ICD-10-CM

## 2017-07-14 DIAGNOSIS — R509 Fever, unspecified: Secondary | ICD-10-CM

## 2017-07-14 DIAGNOSIS — R51 Headache: Secondary | ICD-10-CM

## 2017-07-14 DIAGNOSIS — W57XXXA Bitten or stung by nonvenomous insect and other nonvenomous arthropods, initial encounter: Secondary | ICD-10-CM | POA: Diagnosis not present

## 2017-07-14 DIAGNOSIS — R42 Dizziness and giddiness: Secondary | ICD-10-CM

## 2017-07-14 MED ORDER — DOXYCYCLINE HYCLATE 100 MG PO TABS
100.0000 mg | ORAL_TABLET | Freq: Two times a day (BID) | ORAL | 0 refills | Status: DC
Start: 2017-07-14 — End: 2017-10-10

## 2017-07-14 NOTE — Progress Notes (Signed)
Subjective:    Patient ID: Joshua [name], male    DOB: Dec 04, 1975, 42 y.o.   MRN: [id number]  Chief Complaint  Patient presents with  . Tick Removal  . Headache  . Ear Pain  . Fever    HPI PT presents to the office to establish care and states he went fishing on 07/04/17 and when he came home he noticed a tick on left upper back and right chest. He removed both of these.   He reports on 07/11/17 he noticed he had a headache, dizziness, cold chills, low grade fever, and achiness.       Review of Systems  All other systems reviewed and are negative.  Family History  Problem Relation Age of Onset  . Hypertension Mother   . Diabetes Father     Social History   Socioeconomic History  . Marital status: Single    Spouse name: Not on file  . Number of children: Not on file  . Years of education: Not on file  . Highest education level: Not on file  Occupational History  . Occupation: Film/video editor  Social Needs  . Financial resource strain: Not on file  . Food insecurity:    Worry: Not on file    Inability: Not on file  . Transportation needs:    Medical: Not on file    Non-medical: Not on file  Tobacco Use  . Smoking status: Former Smoker    Packs/day: 1.00    Years: 25.00    Pack years: 25.00    Last attempt to quit: 10/23/2013    Years since quitting: 3.7  . Smokeless tobacco: Current User  . Tobacco comment: Currently uses Vapor cig daily  Substance and Sexual Activity  . Alcohol use: No    Alcohol/week: 0.0 oz  . Drug use: No  . Sexual activity: Yes    Birth control/protection: Condom  Lifestyle  . Physical activity:    Days per week: Not on file    Minutes per session: Not on file  . Stress: Not on file  Relationships  . Social connections:    Talks on phone: Not on file    Gets together: Not on file    Attends religious service: Not on file    Active member of club or organization: Not on file    Attends meetings of clubs or organizations:  Not on file    Relationship status: Not on file  Other Topics Concern  . Not on file  Social History Narrative  . Not on file       Objective:   Physical Exam  Constitutional: He is oriented to person, place, and time. He appears well-developed and well-nourished. No distress.  HENT:  Head: Normocephalic.  Right Ear: External ear normal.  Left Ear: External ear normal.  Mouth/Throat: Posterior oropharyngeal erythema present.  Eyes: Pupils are equal, round, and reactive to light. Right eye exhibits no discharge. Left eye exhibits no discharge.  Neck: Normal range of motion. Neck supple. No thyromegaly present.  Cardiovascular: Normal rate, regular rhythm, normal heart sounds and intact distal pulses.  No murmur heard. Pulmonary/Chest: Effort normal and breath sounds normal. No respiratory distress. He has no wheezes.  Abdominal: Soft. Bowel sounds are normal. He exhibits no distension. There is no tenderness.  Musculoskeletal: Normal range of motion. He exhibits no edema or tenderness.  Neurological: He is alert and oriented to person, place, and time. He has normal reflexes. No cranial nerve  deficit.  Skin: Skin is warm and dry. No rash noted. No erythema.  Psychiatric: He has a normal mood and affect. His behavior is normal. Judgment and thought content normal.  Vitals reviewed.     BP 123/80   Pulse 74   Temp 99 F (37.2 C) (Oral)   Ht 5' 8.5" (1.74 m)   Wt 254 lb 9.6 oz (115.5 kg)   BMI 38.15 kg/m      Assessment & Plan:  Joshua [name] comes in today with chief complaint of Tick Removal; Headache; Ear Pain; and Fever   Diagnosis and orders addressed:  1. Tick bite, initial encounter -Pt to report any new fever, joint pain, or rash -Wear protective clothing while outside- Long sleeves and long pants -Put insect repellent on all exposed skin and along clothing -Take a shower as soon as possible after being outside RTO is symptoms worsen or do not improve -  Lyme Ab/Western Blot Reflex - Rocky mtn spotted fvr abs pnl(IgG+IgM) - BMP8+EGFR  2. Flu-like symptoms - BMP8+EGFR  3. Morbid obesity (HCC) - BMP8+EGFR  4. Tobacco abuse - BMP8+EGFR   Evelina Dun, FNP

## 2017-07-14 NOTE — Addendum Note (Signed)
Addended by: Jannifer Rodney A on: 07/14/2017 04:14 PM   Modules accepted: Orders

## 2017-07-14 NOTE — Patient Instructions (Signed)

## 2017-07-17 LAB — ROCKY MTN SPOTTED FVR ABS PNL(IGG+IGM)
RMSF IgG: NEGATIVE
RMSF IgM: 0.29 index (ref 0.00–0.89)

## 2017-07-17 LAB — BMP8+EGFR
BUN/Creatinine Ratio: 20 (ref 9–20)
BUN: 16 mg/dL (ref 6–24)
CO2: 22 mmol/L (ref 20–29)
Calcium: 9.3 mg/dL (ref 8.7–10.2)
Chloride: 105 mmol/L (ref 96–106)
Creatinine, Ser: 0.82 mg/dL (ref 0.76–1.27)
GFR calc Af Amer: 128 mL/min/{1.73_m2} (ref >59)
GFR calc non Af Amer: 111 mL/min/{1.73_m2} (ref >59)
Glucose: 104 mg/dL — ABNORMAL HIGH (ref 65–99)
Potassium: 4.3 mmol/L (ref 3.5–5.2)
Sodium: 143 mmol/L (ref 134–144)

## 2017-07-17 LAB — LYME AB/WESTERN BLOT REFLEX
LYME DISEASE AB, QUANT, IGM: <0.8 index (ref 0.00–0.79)
Lyme IgG/IgM Ab: <0.91 {ISR} (ref 0.00–0.90)

## 2017-07-22 ENCOUNTER — Telehealth: Payer: Self-pay | Admitting: Family

## 2017-07-22 NOTE — Telephone Encounter (Signed)
Aware of lab results  

## 2017-08-13 ENCOUNTER — Encounter: Payer: Self-pay | Admitting: Registered Nurse

## 2017-08-13 ENCOUNTER — Encounter: Payer: BLUE CROSS/BLUE SHIELD | Attending: Physical Medicine & Rehabilitation | Admitting: Registered Nurse

## 2017-08-13 VITALS — BP 124/77 | HR 69 | Resp 14 | Ht 68.0 in | Wt 258.0 lb

## 2017-08-13 DIAGNOSIS — Z79891 Long term (current) use of opiate analgesic: Secondary | ICD-10-CM

## 2017-08-13 DIAGNOSIS — M5116 Intervertebral disc disorders with radiculopathy, lumbar region: Secondary | ICD-10-CM | POA: Diagnosis not present

## 2017-08-13 DIAGNOSIS — M545 Low back pain: Secondary | ICD-10-CM | POA: Insufficient documentation

## 2017-08-13 DIAGNOSIS — G894 Chronic pain syndrome: Secondary | ICD-10-CM | POA: Diagnosis not present

## 2017-08-13 DIAGNOSIS — Z5181 Encounter for therapeutic drug level monitoring: Secondary | ICD-10-CM

## 2017-08-13 DIAGNOSIS — Z79899 Other long term (current) drug therapy: Secondary | ICD-10-CM | POA: Insufficient documentation

## 2017-08-13 MED ORDER — MORPHINE SULFATE ER 30 MG PO TBCR: 30.0000 mg | EXTENDED_RELEASE_TABLET | Freq: Two times a day (BID) | ORAL | 0 refills | Status: DC

## 2017-08-13 MED ORDER — OXYCODONE-ACETAMINOPHEN 7.5-325 MG PO TABS: 1.0000 | ORAL_TABLET | Freq: Three times a day (TID) | ORAL | 0 refills | Status: DC | PRN

## 2017-08-13 NOTE — Progress Notes (Signed)
Subjective:    Patient ID: Joshua [name] Rodin, male    DOB: 02-Aug-1975, 42 y.o.   MRN: [id number]  HPI: Joshua [name] Mccullar is a 42 year old malewho returns for follow up appointment for chronic pain and medication refill. He states his pain is located in his lower back. He rates his  Pain 4. His current exercise regime is walking and performing stretching exercises.   [name] Morphine Equivalent is 93.75 MME.   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    Pain Inventory Average Pain 3 Pain Right Now 4 My pain is sharp, stabbing and aching  In the last 24 hours, has pain interfered with the following? General activity 4 Relation with others 3 Enjoyment of life 3 What TIME of day is your pain at its worst? daytime, evening Sleep (in general) Fair  Pain is worse with: walking, bending, sitting, standing and some activites Pain improves with: rest, therapy/exercise and medication Relief from Meds: 5  Mobility walk without assistance how many minutes can you walk? 30 ability to climb steps?  yes do you drive?  yes transfers alone  Function employed # of hrs/week 40  Neuro/Psych No problems in this area  Prior Studies Any changes since last visit?  no  Physicians involved in your care Any changes since last visit?  no   Family History  Problem Relation Age of Onset  . Hypertension Mother   . Diabetes Father    Social History   Socioeconomic History  . Marital status: Single    Spouse name: Not on file  . Number of children: Not on file  . Years of education: Not on file  . Highest education level: Not on file  Occupational History  . Occupation: Biochemist, clinicalDump Truck Driver  Social Needs  . Financial resource strain: Not on file  . Food insecurity:    Worry: Not on file    Inability: Not on file  . Transportation needs:    Medical: Not on file    Non-medical: Not on file  Tobacco Use  . Smoking status: Former Smoker    Packs/day: 1.00    Years: 25.00    Pack  years: 25.00    Last attempt to quit: 10/23/2013    Years since quitting: 3.8  . Smokeless tobacco: Current User  . Tobacco comment: Currently uses Vapor cig daily  Substance and Sexual Activity  . Alcohol use: No    Alcohol/week: 0.0 oz  . Drug use: No  . Sexual activity: Yes    Birth control/protection: Condom  Lifestyle  . Physical activity:    Days per week: Not on file    Minutes per session: Not on file  . Stress: Not on file  Relationships  . Social connections:    Talks on phone: Not on file    Gets together: Not on file    Attends religious service: Not on file    Active member of club or organization: Not on file    Attends meetings of clubs or organizations: Not on file    Relationship status: Not on file  Other Topics Concern  . Not on file  Social History Narrative  . Not on file   Past Surgical History:  Procedure Laterality Date  . KNEE SURGERY Left 2010   Past Medical History:  Diagnosis Date  . Disorders of sacrum   . Displacement of lumbar intervertebral disc without myelopathy   . Facet syndrome, lumbar   .  Lumbago   . Thoracic or lumbosacral neuritis or radiculitis, unspecified    BP 124/77 (BP Location: Left Arm, Patient Position: Sitting, Cuff Size: Large)   Pulse 69   Resp 14   Ht 5\' 8"  (1.727 m)   Wt 258 lb (117 kg)   SpO2 97%   BMI 39.23 kg/m   Opioid Risk Score:   Fall Risk Score:  `1  Depression screen PHQ 2/9  Depression screen [facility] 2/9 07/14/2017 04/09/2017 01/24/2016 11/27/2015 06/10/2014  Decreased Interest 0 0 0 0 0  Down, Depressed, Hopeless 0 0 0 0 0  PHQ - 2 Score 0 0 0 0 0  Altered sleeping - - - - 0  Tired, decreased energy - - - - 0  Change in appetite - - - - 0  Feeling bad or failure about yourself  - - - - 0  Trouble concentrating - - - - 0  Moving slowly or fidgety/restless - - - - 0  Suicidal thoughts - - - - 0  PHQ-9 Score - - - - 0    Review of Systems  Constitutional: Negative.   HENT: Negative.   Eyes:  Negative.   Respiratory: Negative.   Cardiovascular: Negative.   Gastrointestinal: Negative.   Endocrine: Negative.   Genitourinary: Negative.   Musculoskeletal: Positive for arthralgias and back pain.  Skin: Negative.   Allergic/Immunologic: Negative.   Neurological: Negative.   Hematological: Negative.   Psychiatric/Behavioral: Negative.   All other systems reviewed and are negative.      Objective:   Physical Exam  Constitutional: He is oriented to person, place, and time. He appears well-developed and well-nourished.  HENT:  Head: Normocephalic and atraumatic.  Neck: Normal range of motion. Neck supple.  Cardiovascular: Normal rate, regular rhythm and normal heart sounds.  Pulmonary/Chest: Effort normal and breath sounds normal.  Musculoskeletal:  Normal Muscle Bulk and Muscle Testing Reveals:  Upper Extremities: Full ROM and Muscle Strength 5/5 Lumbar Paraspinal Tenderness: L-3-L-5 Lower Extremities: Full ROM and Muscle Strength 5/5 Arises from Table with Ease Narrow Based Gait  Neurological: He is alert and oriented to person, place, and time.  Skin: Skin is warm and dry.  Psychiatric: He has a normal mood and affect.  Nursing note and vitals reviewed.         Assessment & Plan:  1. Chronic low back pain/ Lumbar Radiculopathy: Continue with HomeExercise Regime. 08/13/2017 2. Degenerative disk disease lumbar spine: 08/13/2017  Refilled: Morphine ( MS Contin)30mg  one tablet every 12 hours #60and Percocet 7.5/325 mg one tablet every 8 hours prn #90.  We will continue the opioid monitoring program, this consists of regular clinic visits, examinations, urine drug screen, pill counts as well as use of [place] Controlled Substance Reporting System.  15 minutes of face to face patient care time was spent during this visit. All questions were encouraged and answered.  F/U in 

## 2017-08-21 LAB — DRUG TOX MONITOR 1 W/CONF, ORAL FLD
Amphetamines: NEGATIVE ng/mL (ref <10)
Barbiturates: NEGATIVE ng/mL (ref <10)
Benzodiazepines: NEGATIVE ng/mL (ref <0.50)
Buprenorphine: NEGATIVE ng/mL (ref <0.10)
Buprenorphine: NEGATIVE ng/mL (ref <0.10)
Cocaine: NEGATIVE ng/mL (ref <5.0)
Codeine: NEGATIVE ng/mL (ref <2.5)
Cotinine: 237.8 ng/mL — ABNORMAL HIGH (ref <5.0)
Dihydrocodeine: NEGATIVE ng/mL (ref <2.5)
Fentanyl: NEGATIVE ng/mL (ref <0.10)
Heroin Metabolite: NEGATIVE ng/mL (ref <1.0)
Hydrocodone: NEGATIVE ng/mL (ref <2.5)
Hydromorphone: NEGATIVE ng/mL (ref <2.5)
MARIJUANA: NEGATIVE ng/mL (ref <2.5)
MDMA: NEGATIVE ng/mL (ref <10)
Meprobamate: NEGATIVE ng/mL (ref <2.5)
Methadone: NEGATIVE ng/mL (ref <5.0)
Morphine: 119.6 ng/mL — ABNORMAL HIGH (ref <2.5)
Naloxone: NEGATIVE ng/mL (ref <0.25)
Nicotine Metabolite: POSITIVE ng/mL — AB (ref <5.0)
Norbuprenorphine: NEGATIVE ng/mL (ref <0.50)
Norhydrocodone: NEGATIVE ng/mL (ref <2.5)
Noroxycodone: 10.5 ng/mL — ABNORMAL HIGH (ref <2.5)
Opiates: POSITIVE ng/mL — AB (ref <2.5)
Oxycodone: 21.7 ng/mL — ABNORMAL HIGH (ref <2.5)
Oxymorphone: NEGATIVE ng/mL (ref <2.5)
Phencyclidine: NEGATIVE ng/mL (ref <10)
Tapentadol: NEGATIVE ng/mL (ref <5.0)
Tramadol: NEGATIVE ng/mL (ref <5.0)
Zolpidem: NEGATIVE ng/mL (ref <5.0)

## 2017-08-21 LAB — DRUG TOX ALC METAB W/CON, ORAL FLD: Alcohol Metabolite: NEGATIVE ng/mL (ref <25)

## 2017-08-25 ENCOUNTER — Telehealth: Payer: Self-pay | Admitting: *Deleted

## 2017-08-25 NOTE — Telephone Encounter (Signed)
Urine drug screen for this encounter is consistent for prescribed medication 

## 2017-08-26 ENCOUNTER — Telehealth: Payer: Self-pay | Admitting: *Deleted

## 2017-08-26 NOTE — Telephone Encounter (Signed)
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 He needs a letter from Dr. Riley KillSwartz stating what he takes (morphine, oxycodone), mg strength, frequency and providers signature. 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  He adds that he needs it by Friday, June 21st, 2019..Marland Kitchen

## 2017-08-27 NOTE — Telephone Encounter (Signed)
Letter written and signed, patient notified

## 2017-10-10 ENCOUNTER — Encounter: Payer: BLUE CROSS/BLUE SHIELD | Admitting: Registered Nurse

## 2017-10-10 ENCOUNTER — Encounter: Payer: Self-pay | Attending: Physical Medicine & Rehabilitation | Admitting: Registered Nurse

## 2017-10-10 ENCOUNTER — Encounter: Payer: Self-pay | Admitting: Registered Nurse

## 2017-10-10 VITALS — BP 122/66 | HR 62 | Resp 16 | Ht 68.0 in | Wt 267.0 lb

## 2017-10-10 DIAGNOSIS — G894 Chronic pain syndrome: Secondary | ICD-10-CM

## 2017-10-10 DIAGNOSIS — Z79899 Other long term (current) drug therapy: Secondary | ICD-10-CM | POA: Insufficient documentation

## 2017-10-10 DIAGNOSIS — M5416 Radiculopathy, lumbar region: Secondary | ICD-10-CM

## 2017-10-10 DIAGNOSIS — M545 Low back pain: Secondary | ICD-10-CM | POA: Insufficient documentation

## 2017-10-10 DIAGNOSIS — M5116 Intervertebral disc disorders with radiculopathy, lumbar region: Secondary | ICD-10-CM | POA: Insufficient documentation

## 2017-10-10 DIAGNOSIS — Z5181 Encounter for therapeutic drug level monitoring: Secondary | ICD-10-CM | POA: Insufficient documentation

## 2017-10-10 MED ORDER — MORPHINE SULFATE ER 30 MG PO TBCR: 30.0000 mg | EXTENDED_RELEASE_TABLET | Freq: Two times a day (BID) | ORAL | 0 refills | Status: DC

## 2017-10-10 MED ORDER — OXYCODONE-ACETAMINOPHEN 7.5-325 MG PO TABS: 1.0000 | ORAL_TABLET | Freq: Three times a day (TID) | ORAL | 0 refills | Status: DC | PRN

## 2017-10-10 NOTE — Progress Notes (Signed)
Subjective:    Patient ID: Joshua [name], male    DOB: 11-07-1975, 42 y.o.   MRN: [id number]  HPI: [name] is a 42 year old male who returns for follow up appointment for chronic pain and medication refill. He states his pain is located in his lower back radiating into his left lower extremity. He rates his pain 5. His current exercise regime is walking and performing stretching exercises.   [name] is 93.75 MME. Last UDS was Performed on 08/13/2017, it was consistent.   Pain Inventory Average Pain 5 Pain Right Now 5 My pain is sharp, stabbing and aching  In the last 24 hours, has pain interfered with the following? General activity 4 Relation with others 3 Enjoyment of life 3 What TIME of day is your pain at its worst? daytime, evening  Sleep (in general) Good  Pain is worse with: walking, bending, sitting, standing and some activites Pain improves with: rest, therapy/exercise and medication Relief from Meds: 6  Mobility walk without assistance how many minutes can you walk? 36 ability to climb steps?  yes do you drive?  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  no  Physicians involved in your care Any changes since last visit?  no   Family History  Problem Relation Age of Onset  . Hypertension Mother   . Diabetes Father    Social History   Socioeconomic History  . Marital status: Single    Spouse name: Not on file  . Number of children: Not on file  . Years of education: Not on file  . Highest education level: Not on file  Occupational History  . Occupation: Biochemist, clinical  Social Needs  . Financial resource strain: Not on file  . Food insecurity:    Worry: Not on file    Inability: Not on file  . Transportation needs:    Medical: Not on file    Non-medical: Not on file  Tobacco Use  .  Smoking status: Former Smoker    Packs/day: 1.00    Years: 25.00    Pack years: 25.00    Last attempt to quit: 10/23/2013    Years since quitting: 3.9  . Smokeless tobacco: Current User  . Tobacco comment: Currently uses Vapor cig daily  Substance and Sexual Activity  . Alcohol use: No    Alcohol/week: 0.0 oz  . Drug use: No  . Sexual activity: Yes    Birth control/protection: Condom  Lifestyle  . Physical activity:    Days per week: Not on file    Minutes per session: Not on file  . Stress: Not on file  Relationships  . Social connections:    Talks on phone: Not on file    Gets together: Not on file    Attends religious service: Not on file    Active member of club or organization: Not on file    Attends meetings of clubs or organizations: Not on file    Relationship status: Not on file  Other Topics Concern  . Not on file  Social History Narrative  . Not on file   Past Surgical History:  Procedure Laterality Date  . KNEE SURGERY Left 2010   Past Medical History:  Diagnosis Date  . Disorders of sacrum   . Displacement of lumbar intervertebral disc without  myelopathy   . Facet syndrome, lumbar   . Lumbago   . Thoracic or lumbosacral neuritis or radiculitis, unspecified    BP 122/66 (BP Location: Right Arm, Patient Position: Sitting, Cuff Size: Large)   Pulse 62   Resp 16   Ht 5\' 8"  (1.727 m)   Wt 267 lb (121.1 kg)   SpO2 95%   BMI 40.60 kg/m   Opioid Risk Score:   Fall Risk Score:  `1  Depression screen PHQ 2/9  [facility] 2/9 07/14/2017 04/09/2017 01/24/2016 11/27/2015 06/10/2014  Decreased Interest 0 0 0 0 0  Down, Depressed, Hopeless 0 0 0 0 0  PHQ - 2 Score 0 0 0 0 0  Altered sleeping - - - - 0  Tired, decreased energy - - - - 0  Change in appetite - - - - 0  Feeling bad or failure about yourself  - - - - 0  Trouble concentrating - - - - 0  Moving slowly or fidgety/restless - - - - 0  Suicidal thoughts - - - - 0  PHQ-9 Score - - - - 0     Review of Systems  Constitutional: Negative.   HENT: Negative.   Eyes: Negative.   Respiratory: Negative.   Cardiovascular: Negative.   Gastrointestinal: Negative.   Endocrine: Negative.   Genitourinary: Negative.   Musculoskeletal: Positive for back pain.  Skin: Negative.   Allergic/Immunologic: Negative.   Neurological: Negative.   Hematological: Negative.   All other systems reviewed and are negative.      Objective:   Physical Exam  Constitutional: He is oriented to person, place, and time. He appears well-developed and well-nourished.  HENT:  Head: Normocephalic and atraumatic.  Neck: Normal range of motion. Neck supple.  Cardiovascular: Normal rate and regular rhythm.  Pulmonary/Chest: Effort normal and breath sounds normal.  Musculoskeletal:  Normal Muscle Bulk and Muscle Testing Reveals: Upper Extremities: Full ROM and Muscle Strength 5/5 Lumbar Paraspinal Tenderness: L-3-L-5 Lower Extremities: Full ROM and Muscle Strength 5/5 Arises from chair with ease Narrow Based gait  Neurological: He is alert and oriented to person, place, and time.  Skin: Skin is warm and dry.  Psychiatric: He has a normal mood and affect. His behavior is normal.  Nursing note and vitals reviewed.         Assessment & Plan:  1. Chronic low back pain/ Lumbar Radiculopathy: Continue with HomeExercise Regime. 10/10/2017 2. Degenerative disk disease lumbar spine: 10/10/2017  Refilled: Morphine ( MS Contin)30mg  one tablet every 12 hours #60and Percocet 7.5/325 mg one tablet every 8 hours prn #90. Second script e-scribe for the following month. We will continue the opioid monitoring program, this consists of regular clinic visits, examinations, urine drug screen, pill counts as well as use of [place].  15 minutes of face to face patient care time was spent during this visit. All questions were encouraged and answered.  F/U in  6Month

## 2017-10-21 ENCOUNTER — Telehealth: Payer: Self-pay | Admitting: *Deleted

## 2017-10-21 NOTE — Telephone Encounter (Addendum)
Oxycodone acetaminophen was approved Effective from 10/21/2017 through 11/19/2017.  (not sure why only approved for a month)  Morphine Sulfate ER 30 mg was approved Effective from 10/21/2017 through 10/20/2018

## 2017-10-21 NOTE — Telephone Encounter (Signed)
Mr Joshua [name] called to alert us to the need for a PA on his morphine sulfate er and percocet.  Prior auths submitted to [employer] commercial through [employer] for both Morphine Sulfate ER 30 mg bid #60, and oxycodone acetaminophen 7.5/325 q 8 hr #90.

## 2017-10-23 NOTE — Telephone Encounter (Signed)
I left a message for Chanetta MarshallKiera with [employer] to ask why his oxycodone acetaminophen was approved for only one month.

## 2017-12-09 ENCOUNTER — Encounter
Payer: BLUE CROSS/BLUE SHIELD | Attending: Physical Medicine & Rehabilitation | Admitting: Physical Medicine & Rehabilitation

## 2017-12-09 ENCOUNTER — Other Ambulatory Visit: Payer: Self-pay

## 2017-12-09 ENCOUNTER — Encounter: Payer: Self-pay | Admitting: Physical Medicine & Rehabilitation

## 2017-12-09 VITALS — BP 132/81 | HR 63 | Ht 68.0 in | Wt 270.8 lb

## 2017-12-09 DIAGNOSIS — M545 Low back pain: Secondary | ICD-10-CM | POA: Insufficient documentation

## 2017-12-09 DIAGNOSIS — Z5181 Encounter for therapeutic drug level monitoring: Secondary | ICD-10-CM | POA: Insufficient documentation

## 2017-12-09 DIAGNOSIS — G894 Chronic pain syndrome: Secondary | ICD-10-CM | POA: Diagnosis not present

## 2017-12-09 DIAGNOSIS — M5116 Intervertebral disc disorders with radiculopathy, lumbar region: Secondary | ICD-10-CM

## 2017-12-09 DIAGNOSIS — Z79899 Other long term (current) drug therapy: Secondary | ICD-10-CM | POA: Diagnosis not present

## 2017-12-09 MED ORDER — MORPHINE SULFATE ER 30 MG PO TBCR: 30.0000 mg | EXTENDED_RELEASE_TABLET | Freq: Two times a day (BID) | ORAL | 0 refills | Status: DC

## 2017-12-09 MED ORDER — OXYCODONE-ACETAMINOPHEN 7.5-325 MG PO TABS: 1.0000 | ORAL_TABLET | Freq: Three times a day (TID) | ORAL | 0 refills | Status: DC | PRN

## 2017-12-09 NOTE — Patient Instructions (Signed)
PLEASE FEEL FREE TO CALL OUR OFFICE WITH ANY PROBLEMS OR QUESTIONS ([phone])      

## 2017-12-09 NOTE — Progress Notes (Signed)
Subjective:    Patient ID: Joshua [name], male    DOB: 28-Jan-1976, 42 y.o.   MRN: [id number]  HPI   Karin is here in follow-up of his low back pain.  I last saw him in April of this year.  He remains on MS Contin 30 mg every 12 hours for pain control with Percocet 7.5 every 8 hours as needed for breakthrough symptoms.  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  He states that he does take time to walk and exercise when he is often tries to stretch before activities and while he is working.  Last urine drug screen was consistent on August 13, 2017.  He reports that bowel and bladder function are fairly regular.    Pain Inventory Average Pain 5 Pain Right Now 5 My pain is sharp, stabbing and aching  In the last 24 hours, has pain interfered with the following? General activity 6 Relation with others 6 Enjoyment of life 6 What TIME of day is your pain at its worst? daytime and evening Sleep (in general) Fair  Pain is worse with: walking, bending, sitting, standing and some activites Pain improves with: rest, therapy/exercise and medication Relief from Meds: 6  Mobility walk without assistance how many minutes can you walk? 30 ability to climb steps?  yes do you drive?  yes  Function employed # of hrs/week 40  Neuro/Psych No problems in this area  Prior Studies Any changes since last visit?  no  Physicians involved in your care Any changes since last visit?  no   Family History  Problem Relation Age of Onset  . Hypertension Mother   . Diabetes Father    Social History   Socioeconomic History  . Marital status: Single    Spouse name: Not on file  . Number of children: Not on file  . Years of education: Not on file  . Highest education level: Not on file  Occupational History  . Occupation: Biochemist, clinical  Social Needs  . Financial resource strain: Not on file  . Food insecurity:    Worry: Not on file    Inability: Not on file  . Transportation  needs:    Medical: Not on file    Non-medical: Not on file  Tobacco Use  . Smoking status: Former Smoker    Packs/day: 1.00    Years: 25.00    Pack years: 25.00    Last attempt to quit: 10/23/2013    Years since quitting: 4.1  . Smokeless tobacco: Current User  . Tobacco comment: Currently uses Vapor cig daily  Substance and Sexual Activity  . Alcohol use: No    Alcohol/week: 0.0 standard drinks  . Drug use: No  . Sexual activity: Yes    Birth control/protection: Condom  Lifestyle  . Physical activity:    Days per week: Not on file    Minutes per session: Not on file  . Stress: Not on file  Relationships  . Social connections:    Talks on phone: Not on file    Gets together: Not on file    Attends religious service: Not on file    Active member of club or organization: Not on file    Attends meetings of clubs or organizations: Not on file    Relationship status: Not on file  Other Topics Concern  . Not on file  Social History Narrative  . Not on file   Past Surgical History:  Procedure Laterality  Date  . KNEE SURGERY Left 2010   Past Medical History:  Diagnosis Date  . Disorders of sacrum   . Displacement of lumbar intervertebral disc without myelopathy   . Facet syndrome, lumbar   . Lumbago   . Thoracic or lumbosacral neuritis or radiculitis, unspecified    BP 132/81   Pulse 63   Ht 5\' 8"  (1.727 m)   Wt 270 lb 12.8 oz (122.8 kg)   SpO2 97%   BMI 41.17 kg/m    Opioid Risk Score:   Fall Risk Score:  `1  Depression screen PHQ 2/9  Depression screen [facility] 2/9 12/09/2017 07/14/2017 04/09/2017 01/24/2016 11/27/2015 06/10/2014  Decreased Interest 0 0 0 0 0 0  Down, Depressed, Hopeless 0 0 0 0 0 0  PHQ - 2 Score 0 0 0 0 0 0  Altered sleeping - - - - - 0  Tired, decreased energy - - - - - 0  Change in appetite - - - - - 0  Feeling bad or failure about yourself  - - - - - 0  Trouble concentrating - - - - - 0  Moving slowly or fidgety/restless - - - - - 0  Suicidal  thoughts - - - - - 0  PHQ-9 Score - - - - - 0    Review of Systems  Constitutional: Negative.   HENT: Negative.   Eyes: Negative.   Respiratory: Negative.   Cardiovascular: Negative.   Gastrointestinal: Negative.   Endocrine: Negative.   Genitourinary: Negative.   Musculoskeletal: Negative.   Skin: Negative.   Allergic/Immunologic: Negative.   Neurological: Negative.   Hematological: Negative.   Psychiatric/Behavioral: Negative.   All other systems reviewed and are negative.      Objective:   Physical Exam General: No acute distress HEENT: EOMI, oral membranes moist Cards: reg rate  Chest: normal effort Abdomen: Soft, NT, ND Skin: dry, intact Extremities: no edema Cardiovascular:RRR without murmur. No JVD.  Pulmonary/Chest:CTA Bilaterally without wheezes or rales. Normal effort.  Abdominal: Soft. Bowel sounds are normal.  Musculoskeletal:  Lumbar back:  TTP, can bend to about 80-90 degrees before he becomes uncomfortable.  Neurological: He is alert and oriented to person, place, and time.  Psych: pleasant   Assessment & Plan:  ASSESSMENT:  1. Chronic low back pain  2. Degenerative disk disease lumbar spine.  3. OSA    PLAN:  1.  Maintain MS Contin at 30mg  q12 #60.We will continue the controlled substance monitoring program, this consists of regular clinic visits, examinations, routine drug screening, pill counts as well as use of [place] Controlled Substance Reporting System. [place] today.   -second rx'es provided for next month 2. Percocet 7.5/325 one p.o. q.6 h. p.r.n. 90. rf today. Second rx for next month 3. My NP will see him in about . 10 minutes of face to face patient care time were spent during this visit. All questions were encouraged and answered.

## 2017-12-15 ENCOUNTER — Telehealth: Payer: Self-pay

## 2017-12-15 NOTE — Telephone Encounter (Signed)
Pt called stating that he had a UDS done for work and Dr. 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 Please advise. Dr. Lew Dawes [phone]

## 2017-12-15 NOTE — Telephone Encounter (Signed)
Dr. Lew Dawes has been notified. Our phone was given if needed further information.

## 2017-12-15 NOTE — Telephone Encounter (Signed)
Pt may drive while taking the medications he's on.

## 2018-02-09 ENCOUNTER — Encounter: Payer: Self-pay | Admitting: Registered Nurse

## 2018-02-09 ENCOUNTER — Encounter: Payer: BLUE CROSS/BLUE SHIELD | Attending: Physical Medicine & Rehabilitation | Admitting: Registered Nurse

## 2018-02-09 ENCOUNTER — Encounter: Payer: BLUE CROSS/BLUE SHIELD | Admitting: Registered Nurse

## 2018-02-09 VITALS — BP 135/85 | HR 68 | Ht 68.0 in | Wt 278.0 lb

## 2018-02-09 DIAGNOSIS — G894 Chronic pain syndrome: Secondary | ICD-10-CM | POA: Diagnosis not present

## 2018-02-09 DIAGNOSIS — Z5181 Encounter for therapeutic drug level monitoring: Secondary | ICD-10-CM | POA: Diagnosis not present

## 2018-02-09 DIAGNOSIS — Z79899 Other long term (current) drug therapy: Secondary | ICD-10-CM

## 2018-02-09 DIAGNOSIS — M545 Low back pain: Secondary | ICD-10-CM | POA: Diagnosis not present

## 2018-02-09 DIAGNOSIS — M5116 Intervertebral disc disorders with radiculopathy, lumbar region: Secondary | ICD-10-CM | POA: Diagnosis not present

## 2018-02-09 MED ORDER — MORPHINE SULFATE ER 30 MG PO TBCR: 30.0000 mg | EXTENDED_RELEASE_TABLET | Freq: Two times a day (BID) | ORAL | 0 refills | Status: DC

## 2018-02-09 MED ORDER — OXYCODONE-ACETAMINOPHEN 7.5-325 MG PO TABS: 1.0000 | ORAL_TABLET | Freq: Three times a day (TID) | ORAL | 0 refills | Status: DC | PRN

## 2018-02-09 NOTE — Progress Notes (Signed)
Subjective:    Patient ID: Joshua [name], male    DOB: 07-21-1975, 42 y.o.   MRN: [id number]  HPI Joshua [name] is a 42 y.o. male who returns for follow up appointment for chronic pain and medication refill.  He states his pain is located in his lower back radiating into his left lower extremity. He rates his pain 4. His current exercise regime is walking and performing stretching exercises.  Mr.. [name] Morphine equivalent is 93.75 MME.  Last UDS was Performed on 08/13/2017, it was consistent   Pain Inventory Average Pain 4 Pain Right Now 4 My pain is stabbing and aching  In the last 24 hours, has pain interfered with the following? General activity 5 Relation with others 5 Enjoyment of life 4 What TIME of day is your pain at its worst? evening Sleep (in general) Fair  Pain is worse with: walking, bending, sitting, standing and some activites Pain improves with: rest, therapy/exercise and medication Relief from Meds: 5  Mobility walk without assistance ability to climb steps?  yes  Function employed # of hrs/week 40  Neuro/Psych No problems in this area  Prior Studies Any changes since last visit?  no  Physicians involved in your care Any changes since last visit?  no   Family History  Problem Relation Age of Onset  . Hypertension Mother   . Diabetes Father    Social History   Socioeconomic History  . Marital status: Single    Spouse name: Not on file  . Number of children: Not on file  . Years of education: Not on file  . Highest education level: Not on file  Occupational History  . Occupation: Biochemist, clinical  Social Needs  . Financial resource strain: Not on file  . Food insecurity:    Worry: Not on file    Inability: Not on file  . Transportation needs:    Medical: Not on file    Non-medical: Not on file  Tobacco Use  . Smoking status: Former Smoker    Packs/day: 1.00    Years: 25.00    Pack years: 25.00    Last attempt to quit:  10/23/2013    Years since quitting: 4.3  . Smokeless tobacco: Current User  . Tobacco comment: Currently uses Vapor cig daily  Substance and Sexual Activity  . Alcohol use: No    Alcohol/week: 0.0 standard drinks  . Drug use: No  . Sexual activity: Yes    Birth control/protection: Condom  Lifestyle  . Physical activity:    Days per week: Not on file    Minutes per session: Not on file  . Stress: Not on file  Relationships  . Social connections:    Talks on phone: Not on file    Gets together: Not on file    Attends religious service: Not on file    Active member of club or organization: Not on file    Attends meetings of clubs or organizations: Not on file    Relationship status: Not on file  Other Topics Concern  . Not on file  Social History Narrative  . Not on file   Past Surgical History:  Procedure Laterality Date  . KNEE SURGERY Left 2010   Past Medical History:  Diagnosis Date  . Disorders of sacrum   . Displacement of lumbar intervertebral disc without myelopathy   . Facet syndrome, lumbar   . Lumbago   . Thoracic or lumbosacral neuritis or radiculitis, unspecified  There were no vitals taken for this visit.  Opioid Risk Score:   Fall Risk Score:  `1  Depression screen PHQ 2/9  [facility] 2/9 12/09/2017 07/14/2017 04/09/2017 01/24/2016 11/27/2015 06/10/2014  Decreased Interest 0 0 0 0 0 0  Down, Depressed, Hopeless 0 0 0 0 0 0  PHQ - 2 Score 0 0 0 0 0 0  Altered sleeping - - - - - 0  Tired, decreased energy - - - - - 0  Change in appetite - - - - - 0  Feeling bad or failure about yourself  - - - - - 0  Trouble concentrating - - - - - 0  Moving slowly or fidgety/restless - - - - - 0  Suicidal thoughts - - - - - 0  PHQ-9 Score - - - - - 0     Review of Systems  Constitutional: Negative.   HENT: Negative.   Eyes: Negative.   Respiratory: Negative.   Cardiovascular: Negative.   Gastrointestinal: Negative.   Endocrine: Negative.     Genitourinary: Negative.   Musculoskeletal: Positive for arthralgias and back pain.  Skin: Negative.   Allergic/Immunologic: Negative.   Neurological: Negative.   Hematological: Negative.   Psychiatric/Behavioral: Negative.   All other systems reviewed and are negative.      Objective:   Physical Exam  Constitutional: He is oriented to person, place, and time. He appears well-developed and well-nourished.  HENT:  Head: Normocephalic and atraumatic.  Neck: Normal range of motion. Neck supple.  Pulmonary/Chest: Effort normal and breath sounds normal.  Musculoskeletal:  Normal Muscle Bulk and Muscle Testing Reveals:  Upper Extremities: Full ROM and Muscle Strength 5/5 Lumbar Paraspinal Tenderness: L-3-L-5  Lower Extremities: Full ROM and Muscle Strength 5/5 Narrow Based Gait   Neurological: He is alert and oriented to person, place, and time.  Skin: Skin is warm and dry.  Psychiatric: He has a normal mood and affect. His behavior is normal.  Nursing note and vitals reviewed.         Assessment & Plan:  1. Chronic low back pain/ Lumbar Radiculopathy: Continue with HomeExercise Regime. 02/09/2018 2. Degenerative disk disease lumbar spine: 02/09/2018  Refilled: Morphine ( MS Contin)30mg  one tablet every 12 hours #60and Percocet 7.5/325 mg one tablet every 8 hours prn #90. Second script e-scribe for the following month. We will continue the opioid monitoring program, this consists of regular clinic visits, examinations, urine drug screen, pill counts as well as use of [place].  15 minutes of face to face patient care time was spent during this visit. All questions were encouraged and answered.  F/U in 91Month

## 2018-03-23 ENCOUNTER — Telehealth: Payer: Self-pay | Admitting: *Deleted

## 2018-03-23 NOTE — Telephone Encounter (Signed)
Patient is asking for a letter to excuse him from [employer].  He is asking if it could please state Patient unable to sit for extended periods of time due to diagnosis of chronic low back pain. He says he needs the letter by the 24th of January.

## 2018-03-30 NOTE — Telephone Encounter (Signed)
Mr Albares has called back about the letter excusing him from jury duty.

## 2018-03-31 NOTE — Telephone Encounter (Signed)
Letter written

## 2018-03-31 NOTE — Telephone Encounter (Signed)
Mr Disotell notified.  He will pick up at the front desk.

## 2018-04-10 ENCOUNTER — Encounter: Payer: Self-pay | Admitting: Registered Nurse

## 2018-04-10 ENCOUNTER — Encounter: Payer: BLUE CROSS/BLUE SHIELD | Attending: Physical Medicine & Rehabilitation | Admitting: Registered Nurse

## 2018-04-10 VITALS — BP 129/79 | HR 67 | Resp 16 | Ht 68.0 in | Wt 280.0 lb

## 2018-04-10 DIAGNOSIS — G894 Chronic pain syndrome: Secondary | ICD-10-CM

## 2018-04-10 DIAGNOSIS — M545 Low back pain: Secondary | ICD-10-CM | POA: Insufficient documentation

## 2018-04-10 DIAGNOSIS — Z79899 Other long term (current) drug therapy: Secondary | ICD-10-CM | POA: Diagnosis not present

## 2018-04-10 DIAGNOSIS — Z5181 Encounter for therapeutic drug level monitoring: Secondary | ICD-10-CM | POA: Diagnosis not present

## 2018-04-10 DIAGNOSIS — Z79891 Long term (current) use of opiate analgesic: Secondary | ICD-10-CM

## 2018-04-10 DIAGNOSIS — M5116 Intervertebral disc disorders with radiculopathy, lumbar region: Secondary | ICD-10-CM | POA: Diagnosis not present

## 2018-04-10 MED ORDER — OXYCODONE-ACETAMINOPHEN 7.5-325 MG PO TABS: 1.0000 | ORAL_TABLET | Freq: Three times a day (TID) | ORAL | 0 refills | Status: DC | PRN

## 2018-04-10 MED ORDER — MORPHINE SULFATE ER 30 MG PO TBCR: 30.0000 mg | EXTENDED_RELEASE_TABLET | Freq: Two times a day (BID) | ORAL | 0 refills | Status: DC

## 2018-04-10 NOTE — Progress Notes (Signed)
Subjective:    Patient ID: Joshua [name], male    DOB: [birth year], 43 y.o.   MRN: [id number]  HPI: [name] is a 43 y.o. male who returns for follow up appointment for chronic pain and medication refill. He states his pain is located in his lower back and radiating into his bilateral lower extremities. He rates his pain 4. His current exercise regime is walking and performing stretching exercises  Mr. Neier Morphine equivalent is 93.75 MME. Last UDS was Performed on 08/13/2017, it was consistent. UDS ordered today.    Pain Inventory Average Pain 5 Pain Right Now 4 My pain is sharp, stabbing and aching  In the last 24 hours, has pain interfered with the following? General activity 4 Relation with others 4 Enjoyment of life 3 What TIME of day is your pain at its worst? morning, daytime Sleep (in general) Fair  Pain is worse with: walking, bending, sitting, standing and some activites Pain improves with: rest, therapy/exercise and medication Relief from Meds: 6  Mobility walk without assistance how many minutes can you walk? 30 ability to climb steps?  yes do you drive?  yes  Function employed # of hrs/week 40  Neuro/Psych No problems in this area  Prior Studies Any changes since last visit?  no  Physicians involved in your care Any changes since last visit?  no   Family History  Problem Relation Age of Onset  . Hypertension Mother   . Diabetes Father    Social History   Socioeconomic History  . Marital status: Single    Spouse name: Not on file  . Number of children: Not on file  . Years of education: Not on file  . Highest education level: Not on file  Occupational History  . Occupation: Biochemist, clinical  Social Needs  . Financial resource strain: Not on file  . Food insecurity:    Worry: Not on file    Inability: Not on file  . Transportation needs:    Medical: Not on file    Non-medical: Not on file  Tobacco Use  . Smoking status:  Former Smoker    Packs/day: 1.00    Years: 25.00    Pack years: 25.00    Last attempt to quit: 10/23/2013    Years since quitting: 4.4  . Smokeless tobacco: Current User  . Tobacco comment: Currently uses Vapor cig daily  Substance and Sexual Activity  . Alcohol use: No    Alcohol/week: 0.0 standard drinks  . Drug use: No  . Sexual activity: Yes    Birth control/protection: Condom  Lifestyle  . Physical activity:    Days per week: Not on file    Minutes per session: Not on file  . Stress: Not on file  Relationships  . Social connections:    Talks on phone: Not on file    Gets together: Not on file    Attends religious service: Not on file    Active member of club or organization: Not on file    Attends meetings of clubs or organizations: Not on file    Relationship status: Not on file  Other Topics Concern  . Not on file  Social History Narrative  . Not on file   Past Surgical History:  Procedure Laterality Date  . KNEE SURGERY Left 2010   Past Medical History:  Diagnosis Date  . Disorders of sacrum   . Displacement of lumbar intervertebral disc without myelopathy   .  Facet syndrome, lumbar   . Lumbago   . Thoracic or lumbosacral neuritis or radiculitis, unspecified    BP 129/79   Pulse 67   Resp 16   Ht 5\' 8"  (1.727 m)   Wt 280 lb (127 kg)   SpO2 96%   BMI 42.57 kg/m   Opioid Risk Score:   Fall Risk Score:  `1  Depression screen PHQ 2/9  Depression screen [facility] 2/9 12/09/2017 07/14/2017 04/09/2017 01/24/2016 11/27/2015 06/10/2014  Decreased Interest 0 0 0 0 0 0  Down, Depressed, Hopeless 0 0 0 0 0 0  PHQ - 2 Score 0 0 0 0 0 0  Altered sleeping - - - - - 0  Tired, decreased energy - - - - - 0  Change in appetite - - - - - 0  Feeling bad or failure about yourself  - - - - - 0  Trouble concentrating - - - - - 0  Moving slowly or fidgety/restless - - - - - 0  Suicidal thoughts - - - - - 0  PHQ-9 Score - - - - - 0    Review of Systems  Constitutional:  Negative.   HENT: Negative.   Eyes: Negative.   Respiratory: Negative.   Cardiovascular: Negative.   Gastrointestinal: Negative.   Endocrine: Negative.   Genitourinary: Negative.   Musculoskeletal: Positive for back pain.  Skin: Negative.   Allergic/Immunologic: Negative.   Neurological: Negative.   Hematological: Negative.   Psychiatric/Behavioral: Negative.   All other systems reviewed and are negative.      Objective:   Physical Exam Vitals signs and nursing note reviewed.  Constitutional:      Appearance: Normal appearance.  Neck:     Musculoskeletal: Normal range of motion and neck supple.  Cardiovascular:     Rate and Rhythm: Normal rate and regular rhythm.     Pulses: Normal pulses.     Heart sounds: Normal heart sounds.  Pulmonary:     Effort: Pulmonary effort is normal.     Breath sounds: Normal breath sounds.  Musculoskeletal:     Comments: Normal Muscle Bulk and Muscle Testing Reveals:  Upper Extremities: Full ROM and Muscle Strength 5/5  Lumbar Paraspinal Tenderness: L-3-L-5 Lower Extremities: Full ROM and Muscle Strength 5/5 Arises from Table with ease Narrow Based  Gait   Skin:    General: Skin is warm and dry.  Neurological:     Mental Status: He is alert and oriented to person, place, and time.  Psychiatric:        Mood and Affect: Mood normal.        Behavior: Behavior normal.           Assessment & Plan:  1. Chronic low back pain/ Lumbar Radiculopathy: Continue with HomeExercise Regime. 04/10/2018 2. Degenerative disk disease lumbar spine: 04/10/2018 Refilled: Morphine ( MS Contin)30mg  one tablet every 12 hours #60and Percocet 7.5/325 mg one tablet every 8 hours prn #90. Second script e-scribe for the following month. We will continue the opioid monitoring program, this consists of regular clinic visits, examinations, urine drug screen, pill counts as well as use of [place] Controlled Substance Reporting System.  15 minutes of  face to face patient care time was spent during this visit. All questions were encouraged and answered.  F/U in 2 Monts

## 2018-04-15 ENCOUNTER — Telehealth: Payer: Self-pay | Admitting: *Deleted

## 2018-04-15 LAB — TOXASSURE SELECT,+ANTIDEPR,UR

## 2018-04-15 LAB — 6-ACETYLMORPHINE,TOXASSURE ADD
6-ACETYLMORPHINE: NEGATIVE
6-acetylmorphine: NOT DETECTED ng/mg creat

## 2018-04-15 NOTE — Telephone Encounter (Signed)
Urine drug screen for this encounter is consistent for prescribed medication 

## 2018-06-05 ENCOUNTER — Encounter: Payer: BLUE CROSS/BLUE SHIELD | Attending: Physical Medicine & Rehabilitation | Admitting: Registered Nurse

## 2018-06-05 ENCOUNTER — Other Ambulatory Visit: Payer: Self-pay

## 2018-06-05 ENCOUNTER — Encounter: Payer: Self-pay | Admitting: Registered Nurse

## 2018-06-05 VITALS — Ht 68.0 in | Wt 280.0 lb

## 2018-06-05 DIAGNOSIS — Z5181 Encounter for therapeutic drug level monitoring: Secondary | ICD-10-CM | POA: Insufficient documentation

## 2018-06-05 DIAGNOSIS — M5116 Intervertebral disc disorders with radiculopathy, lumbar region: Secondary | ICD-10-CM

## 2018-06-05 DIAGNOSIS — M545 Low back pain: Secondary | ICD-10-CM | POA: Insufficient documentation

## 2018-06-05 DIAGNOSIS — Z79891 Long term (current) use of opiate analgesic: Secondary | ICD-10-CM | POA: Diagnosis not present

## 2018-06-05 DIAGNOSIS — G894 Chronic pain syndrome: Secondary | ICD-10-CM | POA: Diagnosis not present

## 2018-06-05 DIAGNOSIS — Z79899 Other long term (current) drug therapy: Secondary | ICD-10-CM | POA: Insufficient documentation

## 2018-06-05 MED ORDER — MORPHINE SULFATE ER 30 MG PO TBCR: 30.0000 mg | EXTENDED_RELEASE_TABLET | Freq: Two times a day (BID) | ORAL | 0 refills | Status: DC

## 2018-06-05 MED ORDER — OXYCODONE-ACETAMINOPHEN 7.5-325 MG PO TABS: 1.0000 | ORAL_TABLET | Freq: Three times a day (TID) | ORAL | 0 refills | Status: DC | PRN

## 2018-06-05 NOTE — Progress Notes (Signed)
Subjective:    Patient ID: Joshua [name], male    DOB: 16-Feb-1976, 43 y.o.   MRN: [id number]  HPI: This provider placed a call to Joshua [name] is a 43 y.o. male, his appointment was changed to a virtual office visit to reduce the risk of exposure to the COVID-19 virus and to help him remain healthy and safe. The virtual visit will also provide continuity of care. He verbalizes understanding. He  States his  pain is located in his lower back radiating into his lower extremities L>R. He rates his  Pain 4. His current exercise regime is walking and performing core exercises.  Joshua [name] Morphine equivalent is 93.75  MME.  Last UDS was Performed on 04/10/2018.   Pain Inventory Average Pain 6 Pain Right Now 4 My pain is constant, burning and aching  In the last 24 hours, has pain interfered with the following? General activity 4 Relation with others 3 Enjoyment of life 3 What TIME of day is your pain at its worst? varies with activity Sleep (in general) Good  Pain is worse with: walking, bending, sitting, standing and some activites Pain improves with: therapy/exercise and medication Relief from Meds: 6  Mobility walk without assistance  Function employed # of hrs/week .[employer]  Neuro/Psych No problems in this area  Prior Studies Any changes since last visit?  no  Physicians involved in your care Any changes since last visit?  no   Family History  Problem Relation Age of Onset  . Hypertension Mother   . Diabetes Father    Social History   Socioeconomic History  . Marital status: Single    Spouse name: Not on file  . Number of children: Not on file  . Years of education: Not on file  . Highest education level: Not on file  Occupational History  . Occupation: Biochemist, clinical  Social Needs  . Financial resource strain: Not on file  . Food insecurity:    Worry: Not on file    Inability: Not on file  . Transportation needs:    Medical: Not on file   Non-medical: Not on file  Tobacco Use  . Smoking status: Former Smoker    Packs/day: 1.00    Years: 25.00    Pack years: 25.00    Last attempt to quit: 10/23/2013    Years since quitting: 4.6  . Smokeless tobacco: Current User  . Tobacco comment: Currently uses Vapor cig daily  Substance and Sexual Activity  . Alcohol use: No    Alcohol/week: 0.0 standard drinks  . Drug use: No  . Sexual activity: Yes    Birth control/protection: Condom  Lifestyle  . Physical activity:    Days per week: Not on file    Minutes per session: Not on file  . Stress: Not on file  Relationships  . Social connections:    Talks on phone: Not on file    Gets together: Not on file    Attends religious service: Not on file    Active member of club or organization: Not on file    Attends meetings of clubs or organizations: Not on file    Relationship status: Not on file  Other Topics Concern  . Not on file  Social History Narrative  . Not on file   Past Surgical History:  Procedure Laterality Date  . KNEE SURGERY Left 2010   Past Medical History:  Diagnosis Date  . Disorders of sacrum   .  Displacement of lumbar intervertebral disc without myelopathy   . Facet syndrome, lumbar   . Lumbago   . Thoracic or lumbosacral neuritis or radiculitis, unspecified    Ht 5\' 8"  (1.727 m)   Wt 280 lb (127 kg)   BMI 42.57 kg/m   Opioid Risk Score:   Fall Risk Score:  `1  Depression screen PHQ 2/9  Depression screen [facility] 2/9 12/09/2017 07/14/2017 04/09/2017 01/24/2016 11/27/2015 06/10/2014  Decreased Interest 0 0 0 0 0 0  Down, Depressed, Hopeless 0 0 0 0 0 0  PHQ - 2 Score 0 0 0 0 0 0  Altered sleeping - - - - - 0  Tired, decreased energy - - - - - 0  Change in appetite - - - - - 0  Feeling bad or failure about yourself  - - - - - 0  Trouble concentrating - - - - - 0  Moving slowly or fidgety/restless - - - - - 0  Suicidal thoughts - - - - - 0  PHQ-9 Score - - - - - 0   2 Review of Systems   Constitutional: Negative.   HENT: Negative.   Eyes: Negative.   Respiratory: Positive for apnea.   Cardiovascular: Negative.   Gastrointestinal: Negative.   Endocrine: Negative.   Genitourinary: Negative.   Musculoskeletal: Positive for arthralgias and back pain.  Neurological: Negative.   Hematological: Negative.   Psychiatric/Behavioral: Negative.   All other systems reviewed and are negative.      Objective:   Physical Exam Nursing note reviewed.  Neurological:     Mental Status: He is oriented to person, place, and time.           Assessment & Plan:  1. Chronic low back pain/ Lumbar Radiculopathy: Continue with HomeExercise Regime.06/05/2018 2. Degenerative disk disease lumbar spine: 06/05/2018 Refilled: Morphine ( MS Contin)30mg  one tablet every 12 hours #60and Percocet 7.5/325 mg one tablet every 8 hours prn #90. Second script e-scribe for the following month. 06/05/2018 We will continue the opioid monitoring program, this consists of regular clinic visits, examinations, urine drug screen, pill counts as well as use of [place] Controlled Substance Reporting System.   F/U in 2 Months

## 2018-07-27 ENCOUNTER — Telehealth: Payer: Self-pay

## 2018-07-27 NOTE — Telephone Encounter (Signed)
Letter made and printed and also sent to you as well.  Printed version sitting on your desk for review.

## 2018-07-27 NOTE — Telephone Encounter (Addendum)
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  Stated that he would be unable to take physical unless he has the letter in hand for his medication.

## 2018-07-27 NOTE — Telephone Encounter (Signed)
Will someone write me a letter stating that fact, and i'll sign it tomorrow. Thanks!

## 2018-07-28 NOTE — Telephone Encounter (Signed)
Signed letter placed at front, patient notified.

## 2018-08-06 ENCOUNTER — Other Ambulatory Visit: Payer: Self-pay

## 2018-08-06 ENCOUNTER — Encounter: Payer: BLUE CROSS/BLUE SHIELD | Attending: Physical Medicine & Rehabilitation | Admitting: Registered Nurse

## 2018-08-06 ENCOUNTER — Encounter: Payer: Self-pay | Admitting: Registered Nurse

## 2018-08-06 VITALS — Ht 68.0 in | Wt 280.0 lb

## 2018-08-06 DIAGNOSIS — Z5181 Encounter for therapeutic drug level monitoring: Secondary | ICD-10-CM | POA: Insufficient documentation

## 2018-08-06 DIAGNOSIS — Z79891 Long term (current) use of opiate analgesic: Secondary | ICD-10-CM | POA: Diagnosis not present

## 2018-08-06 DIAGNOSIS — G894 Chronic pain syndrome: Secondary | ICD-10-CM | POA: Diagnosis not present

## 2018-08-06 DIAGNOSIS — M5116 Intervertebral disc disorders with radiculopathy, lumbar region: Secondary | ICD-10-CM | POA: Diagnosis not present

## 2018-08-06 DIAGNOSIS — Z79899 Other long term (current) drug therapy: Secondary | ICD-10-CM | POA: Insufficient documentation

## 2018-08-06 DIAGNOSIS — M545 Low back pain: Secondary | ICD-10-CM | POA: Insufficient documentation

## 2018-08-06 MED ORDER — MORPHINE SULFATE ER 30 MG PO TBCR: 30.0000 mg | EXTENDED_RELEASE_TABLET | Freq: Two times a day (BID) | ORAL | 0 refills | Status: DC

## 2018-08-06 MED ORDER — OXYCODONE-ACETAMINOPHEN 7.5-325 MG PO TABS: 1.0000 | ORAL_TABLET | Freq: Three times a day (TID) | ORAL | 0 refills | Status: DC | PRN

## 2018-08-06 NOTE — Progress Notes (Signed)
Subjective:    Patient ID: Joshua [name], male    DOB: 1975-06-07, 43 y.o.   MRN: [id number]  HPI: Joshua [name] is a 43 y.o. male his appointment was changed, due to national recommendations of social distancing due to COVID 19, an audio/video telehealth visit is felt to be most appropriate for this patient at this time.  See Chart message from today for the patient's consent to telehealth from [facility].     He states his pain is located in his lower back and occasionally radiates into his bilateral lower extremities R>L. He rates his pain 5. His current exercise regime is walking and performing stretching exercises.  Joshua [name] Morphine equivalent is 93.75 MME. Last UDS was Performed on 04/10/2018, it was consistent.   Joshua [name] CMA asked the Health and History Questions. This provider and Joshua [name] verified we were speaking with the correct person using two identifiers.  Pain Inventory Average Pain 5 Pain Right Now 5 My pain is constant and aching  In the last 24 hours, has pain interfered with the following? General activity 5 Relation with others 4 Enjoyment of life 3 What TIME of day is your pain at its worst? evening Sleep (in general) Good  Pain is worse with: walking, sitting, standing and some activites Pain improves with: therapy/exercise and medication Relief from Meds: 6  Mobility walk without assistance how many minutes can you walk? 30 ability to climb steps?  yes do you drive?  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  no  Physicians involved in your care Any changes since last visit?  no   Family History  Problem Relation Age of Onset  . Hypertension Mother   . Diabetes Father    Social History   Socioeconomic History  . Marital status: Single    Spouse name: Not on file  .  Number of children: Not on file  . Years of education: Not on file  . Highest education level: Not on file  Occupational History  . Occupation: Biochemist, clinical  Social Needs  . Financial resource strain: Not on file  . Food insecurity:    Worry: Not on file    Inability: Not on file  . Transportation needs:    Medical: Not on file    Non-medical: Not on file  Tobacco Use  . Smoking status: Former Smoker    Packs/day: 1.00    Years: 25.00    Pack years: 25.00    Last attempt to quit: 10/23/2013    Years since quitting: 4.7  . Smokeless tobacco: Current User  . Tobacco comment: Currently uses Vapor cig daily  Substance and Sexual Activity  . Alcohol use: No    Alcohol/week: 0.0 standard drinks  . Drug use: No  . Sexual activity: Yes    Birth control/protection: Condom  Lifestyle  . Physical activity:    Days per week: Not on file    Minutes per session: Not on file  . Stress: Not on file  Relationships  . Social connections:    Talks on phone: Not on file    Gets together: Not on file    Attends religious service: Not on file    Active member of club or organization: Not on file    Attends meetings of clubs or organizations: Not on  file    Relationship status: Not on file  Other Topics Concern  . Not on file  Social History Narrative  . Not on file   Past Surgical History:  Procedure Laterality Date  . KNEE SURGERY Left 2010   Past Medical History:  Diagnosis Date  . Disorders of sacrum   . Displacement of lumbar intervertebral disc without myelopathy   . Facet syndrome, lumbar   . Lumbago   . Thoracic or lumbosacral neuritis or radiculitis, unspecified    Ht 5\' 8"  (1.727 m)   Wt 280 lb (127 kg)   BMI 42.57 kg/m   Opioid Risk Score:   Fall Risk Score:  `1  Depression screen PHQ 2/9  [facility] 12/09/2017 07/14/2017 04/09/2017 01/24/2016 11/27/2015 06/10/2014  Decreased Interest 0 0 0 0 0 0  Down, Depressed, Hopeless 0 0 0 0 0 0  PHQ - 2  Score 0 0 0 0 0 0  Altered sleeping - - - - - 0  Tired, decreased energy - - - - - 0  Change in appetite - - - - - 0  Feeling bad or failure about yourself  - - - - - 0  Trouble concentrating - - - - - 0  Moving slowly or fidgety/restless - - - - - 0  Suicidal thoughts - - - - - 0  PHQ-9 Score - - - - - 0    Review of Systems  Constitutional: Negative.   HENT: Negative.   Eyes: Negative.   Respiratory: Negative.   Cardiovascular: Negative.   Gastrointestinal: Negative.   Endocrine: Negative.   Genitourinary: Negative.   Musculoskeletal: Positive for back pain.  Skin: Negative.   Allergic/Immunologic: Negative.   Neurological: Negative.   Hematological: Negative.   Psychiatric/Behavioral: Negative.   All other systems reviewed and are negative.      Objective:   Physical Exam Vitals signs and nursing note reviewed.  Musculoskeletal:     Comments: No Physical Exam Performed: Virtual Visit  Neurological:     Mental Status: He is oriented to person, place, and time.           Assessment & Plan:  1. Chronic low back pain/ Lumbar Radiculopathy: Continue with HomeExercise Regime.08/06/2018 2. Degenerative disk disease lumbar spine:08/06/2018 Refilled: Morphine ( MS Contin)30mg  one tablet every 12 hours #60and Percocet 7.5/325 mg one tablet every 8 hours prn #90. Second script e-scribe for the following month. 08/06/2018 We will continue the opioid monitoring program, this consists of regular clinic visits, examinations, urine drug screen, pill counts as well as use of [place] Controlled Substance Reporting System.   F/U in2Months  Telephone Call Location of patient: In his Home Location of provider: Office Established patient Time spent on call: 10 Minutes

## 2018-08-07 ENCOUNTER — Telehealth: Payer: Self-pay | Admitting: Family

## 2018-08-10 ENCOUNTER — Ambulatory Visit: Payer: Self-pay | Admitting: Family Medicine

## 2018-09-07 ENCOUNTER — Ambulatory Visit: Payer: Self-pay | Admitting: Family Medicine

## 2018-10-06 ENCOUNTER — Encounter
Payer: BC Managed Care – PPO | Attending: Physical Medicine & Rehabilitation | Admitting: Physical Medicine & Rehabilitation

## 2018-10-06 ENCOUNTER — Encounter: Payer: Self-pay | Admitting: Physical Medicine & Rehabilitation

## 2018-10-06 ENCOUNTER — Other Ambulatory Visit: Payer: Self-pay

## 2018-10-06 VITALS — BP 130/84 | HR 78 | Temp 97.7°F | Ht 68.0 in | Wt 274.4 lb

## 2018-10-06 DIAGNOSIS — Z5181 Encounter for therapeutic drug level monitoring: Secondary | ICD-10-CM | POA: Diagnosis not present

## 2018-10-06 DIAGNOSIS — M5116 Intervertebral disc disorders with radiculopathy, lumbar region: Secondary | ICD-10-CM | POA: Insufficient documentation

## 2018-10-06 DIAGNOSIS — G4733 Obstructive sleep apnea (adult) (pediatric): Secondary | ICD-10-CM

## 2018-10-06 DIAGNOSIS — M545 Low back pain: Secondary | ICD-10-CM | POA: Diagnosis not present

## 2018-10-06 DIAGNOSIS — Z79899 Other long term (current) drug therapy: Secondary | ICD-10-CM | POA: Insufficient documentation

## 2018-10-06 MED ORDER — MORPHINE SULFATE ER 30 MG PO TBCR: 30.0000 mg | EXTENDED_RELEASE_TABLET | Freq: Two times a day (BID) | ORAL | 0 refills | Status: DC

## 2018-10-06 MED ORDER — OXYCODONE-ACETAMINOPHEN 7.5-325 MG PO TABS: 1.0000 | ORAL_TABLET | Freq: Three times a day (TID) | ORAL | 0 refills | Status: DC | PRN

## 2018-10-06 NOTE — Progress Notes (Signed)
Subjective:    Patient ID: Joshua [name], male    DOB: 1975-10-20, 42 y.o.   MRN: [id number]  HPI   Joshua [name] is here in follow-up of his chronic pain.  He has been seeing our nurse practitioner Mrs. Marcello Moores in the meantime since I last saw him several months ago.  He states for the most part his pain levels are acceptable and consistent with past levels.  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  He is working about 25-30+ hours per week presently.  He does stretch daily.  He does get some walking in as well.  He remains on MS Contin 30 mg every 12 hours with oxycodone 7.5 every 8 hours as needed.  He reports normal bowel bladder habits.  He reports no other health changes.  Pain Inventory Average Pain 4 Pain Right Now 5 My pain is sharp, stabbing and aching  In the last 24 hours, has pain interfered with the following? General activity 4 Relation with others 3 Enjoyment of life 3 What TIME of day is your pain at its worst? evening and daytime Sleep (in general) Fair  Pain is worse with: walking, bending, sitting and standing Pain improves with: rest, therapy/exercise and medication Relief from Meds: 6  Mobility walk without assistance ability to climb steps?  yes do you drive?  yes  Function employed # of hrs/week 40  Neuro/Psych No problems in this area  Prior Studies Any changes since last visit?  no  Physicians involved in your care Any changes since last visit?  no   Family History  Problem Relation Age of Onset  . Hypertension Mother   . Diabetes Father    Social History   Socioeconomic History  . Marital status: Single    Spouse name: Not on file  . Number of children: Not on file  . Years of education: Not on file  . Highest education level: Not on file  Occupational History  . Occupation: Film/video editor  Social Needs  . Financial resource strain: Not on file  . Food insecurity    Worry: Not  on file    Inability: Not on file  . Transportation needs    Medical: Not on file    Non-medical: Not on file  Tobacco Use  . Smoking status: Former Smoker    Packs/day: 1.00    Years: 25.00    Pack years: 25.00    Quit date: 10/23/2013    Years since quitting: 4.9  . Smokeless tobacco: Current User  . Tobacco comment: Currently uses Vapor cig daily  Substance and Sexual Activity  . Alcohol use: No    Alcohol/week: 0.0 standard drinks  . Drug use: No  . Sexual activity: Yes    Birth control/protection: Condom  Lifestyle  . Physical activity    Days per week: Not on file    Minutes per session: Not on file  . Stress: Not on file  Relationships  . Social Herbalist on phone: Not on file    Gets together: Not on file    Attends religious service: Not on file    Active member of club or organization: Not on file    Attends meetings of clubs or organizations: Not on file    Relationship status: Not on file  Other Topics Concern  . Not on file  Social History Narrative  . Not on  file   Past Surgical History:  Procedure Laterality Date  . KNEE SURGERY Left 2010   Past Medical History:  Diagnosis Date  . Disorders of sacrum   . Displacement of lumbar intervertebral disc without myelopathy   . Facet syndrome, lumbar   . Lumbago   . Thoracic or lumbosacral neuritis or radiculitis, unspecified    There were no vitals taken for this visit.  Opioid Risk Score:   Fall Risk Score:  `1  Depression screen PHQ 2/9  [facility] 10/06/2018 12/09/2017 07/14/2017 04/09/2017 01/24/2016 11/27/2015 06/10/2014  Decreased Interest 0 0 0 0 0 0 0  Down, Depressed, Hopeless 0 0 0 0 0 0 0  PHQ - 2 Score 0 0 0 0 0 0 0  Altered sleeping - - - - - - 0  Tired, decreased energy - - - - - - 0  Change in appetite - - - - - - 0  Feeling bad or failure about yourself  - - - - - - 0  Trouble concentrating - - - - - - 0  Moving slowly or fidgety/restless - - - - - - 0   Suicidal thoughts - - - - - - 0  PHQ-9 Score - - - - - - 0    Review of Systems  Constitutional: Negative.   HENT: Negative.   Eyes: Negative.   Respiratory: Negative.   Cardiovascular: Negative.   Gastrointestinal: Negative.   Endocrine: Negative.   Genitourinary: Negative.   Musculoskeletal: Positive for back pain.  Skin: Negative.   Allergic/Immunologic: Negative.   Neurological: Negative.   Hematological: Negative.   Psychiatric/Behavioral: Negative.   All other systems reviewed and are negative.      Objective:   Physical Exam Constitutional: No distress . Vital signs reviewed. HEENT: EOMI, oral membranes moist Neck: supple Cardiovascular: RRR without murmur. No JVD    Respiratory: CTA Bilaterally without wheezes or rales. Normal effort    GI: BS +, non-tender, non-distended   Musculoskeletal:  Lumbar back: TTP, can bend to about 80-90 degrees before he becomes uncomfortable. Ext to 25 degrees with some pain. Facet maneuvers negative.  Neurological: He is alert and oriented to person, place, and time.  Psych: pleasant   Assessment & Plan:  ASSESSMENT:  1. Chronic low back pain  2. Degenerative disk disease lumbar spine.  3. OSA    PLAN:  1.Continue MS Contin 30mg  q12 #60. We will continue the controlled substance monitoring program, this consists of regular clinic visits, examinations, routine drug screening, pill counts as well as use of [place] Cont[place]. [place].   -Medication was refilled and a second prescription was sent to the patient's pharmacy for next month.   2. Percocet 7.5/325 one p.o. q.6 h. p.r.n. 90. rf today.   -Medication was refilled and a second prescription was sent to the patient's pharmacy for next month.   3.  Ten minutes of face to face patient care time were spent during this visit. All questions were encouraged and answered.  Follow up with NP in 2 months.  Encouraged  ongoing stretching and aerobic exercise as possible.  .Marland Kitchen

## 2018-10-06 NOTE — Patient Instructions (Signed)
PLEASE FEEL FREE TO CALL OUR OFFICE WITH ANY PROBLEMS OR QUESTIONS ([phone])      

## 2018-11-05 ENCOUNTER — Telehealth: Payer: Self-pay | Admitting: *Deleted

## 2018-11-05 NOTE — Telephone Encounter (Signed)
Prior Auth initiated with [employer] for Morphine Sulfate ER 30 mg #60 via CoverMyMeds.

## 2018-11-09 NOTE — Telephone Encounter (Signed)
Prior auth for Morphine Sulfate ER 30mg  #60 has been approved by [facility] 11/05/18-11/04/19. Patient and pharmacy notified.

## 2018-12-07 ENCOUNTER — Other Ambulatory Visit: Payer: Self-pay

## 2018-12-07 ENCOUNTER — Encounter: Payer: BC Managed Care – PPO | Attending: Physical Medicine & Rehabilitation | Admitting: Registered Nurse

## 2018-12-07 ENCOUNTER — Telehealth: Payer: Self-pay | Admitting: *Deleted

## 2018-12-07 ENCOUNTER — Encounter: Payer: Self-pay | Admitting: Registered Nurse

## 2018-12-07 VITALS — BP 130/84 | HR 74 | Temp 97.7°F | Resp 14 | Wt 278.0 lb

## 2018-12-07 DIAGNOSIS — M545 Low back pain: Secondary | ICD-10-CM | POA: Insufficient documentation

## 2018-12-07 DIAGNOSIS — M5416 Radiculopathy, lumbar region: Secondary | ICD-10-CM | POA: Diagnosis not present

## 2018-12-07 DIAGNOSIS — Z79891 Long term (current) use of opiate analgesic: Secondary | ICD-10-CM

## 2018-12-07 DIAGNOSIS — Z79899 Other long term (current) drug therapy: Secondary | ICD-10-CM | POA: Insufficient documentation

## 2018-12-07 DIAGNOSIS — M5116 Intervertebral disc disorders with radiculopathy, lumbar region: Secondary | ICD-10-CM

## 2018-12-07 DIAGNOSIS — G894 Chronic pain syndrome: Secondary | ICD-10-CM

## 2018-12-07 DIAGNOSIS — Z5181 Encounter for therapeutic drug level monitoring: Secondary | ICD-10-CM | POA: Diagnosis not present

## 2018-12-07 MED ORDER — OXYCODONE-ACETAMINOPHEN 7.5-325 MG PO TABS: 1.0000 | ORAL_TABLET | Freq: Three times a day (TID) | ORAL | 0 refills | Status: DC | PRN

## 2018-12-07 MED ORDER — MORPHINE SULFATE ER 30 MG PO TBCR: 30.0000 mg | EXTENDED_RELEASE_TABLET | Freq: Two times a day (BID) | ORAL | 0 refills | Status: DC

## 2018-12-07 NOTE — Progress Notes (Signed)
Subjective:    Patient ID: Joshua [name], male    DOB: [birth year], 43 y.o.   MRN: [id number]  HPI: Joshua [name] is a 43 y.o. male who returns for follow up appointment for chronic pain and medication refill. He states his  pain is located in his lower back and with activity the pain radiates into his left lower extremity occassionally.  He rates his  Pain 4. His  current exercise regime is walking and performing stretching exercises.   [name] Morphine equivalent is 93.75 MME.  Last UDS was Performed 04/10/2018, it was consistent.    Pain Inventory Average Pain 4 Pain Right Now 4 My pain is sharp, stabbing and aching  In the last 24 hours, has pain interfered with the following? General activity 3 Relation with others 4 Enjoyment of life 4 What TIME of day is your pain at its worst? daytime, evening Sleep (in general) Fair  Pain is worse with: walking, bending, sitting, standing and some activites Pain improves with: rest, therapy/exercise and medication Relief from Meds: 6  Mobility walk without assistance Do you have any goals in this area?  no  Function employed # of hrs/week 40 Do you have any goals in this area?  no  Neuro/Psych No problems in this area  Prior Studies Any changes since last visit?  no  Physicians involved in your care Any changes since last visit?  no   Family History  Problem Relation Age of Onset  . Hypertension Mother   . Diabetes Father    Social History   Socioeconomic History  . Marital status: Single    Spouse name: Not on file  . Number of children: Not on file  . Years of education: Not on file  . Highest education level: Not on file  Occupational History  . Occupation: Film/video editor  Social Needs  . Financial resource strain: Not on file  . Food insecurity    Worry: Not on file    Inability: Not on file  . Transportation needs    Medical: Not on file    Non-medical: Not on file  Tobacco Use  . Smoking  status: Former Smoker    Packs/day: 1.00    Years: 25.00    Pack years: 25.00    Quit date: 10/23/2013    Years since quitting: 5.1  . Smokeless tobacco: Current User  . Tobacco comment: Currently uses Vapor cig daily  Substance and Sexual Activity  . Alcohol use: No    Alcohol/week: 0.0 standard drinks  . Drug use: No  . Sexual activity: Yes    Birth control/protection: Condom  Lifestyle  . Physical activity    Days per week: Not on file    Minutes per session: Not on file  . Stress: Not on file  Relationships  . Social Herbalist on phone: Not on file    Gets together: Not on file    Attends religious service: Not on file    Active member of club or organization: Not on file    Attends meetings of clubs or organizations: Not on file    Relationship status: Not on file  Other Topics Concern  . Not on file  Social History Narrative  . Not on file   Past Surgical History:  Procedure Laterality Date  . KNEE SURGERY Left 2010   Past Medical History:  Diagnosis Date  . Disorders of sacrum   . Displacement of lumbar  intervertebral disc without myelopathy   . Facet syndrome, lumbar   . Lumbago   . Thoracic or lumbosacral neuritis or radiculitis, unspecified    BP 130/84   Pulse 74   Temp 97.7 F (36.5 C)   Resp 14   Wt 278 lb (126.1 kg)   SpO2 97%   BMI 42.27 kg/m   Opioid Risk Score:   Fall Risk Score:  `1  Depression screen PHQ 2/9  Depression screen [facility] 2/9 10/06/2018 12/09/2017 07/14/2017 04/09/2017 01/24/2016 11/27/2015 06/10/2014  Decreased Interest 0 0 0 0 0 0 0  Down, Depressed, Hopeless 0 0 0 0 0 0 0  PHQ - 2 Score 0 0 0 0 0 0 0  Altered sleeping - - - - - - 0  Tired, decreased energy - - - - - - 0  Change in appetite - - - - - - 0  Feeling bad or failure about yourself  - - - - - - 0  Trouble concentrating - - - - - - 0  Moving slowly or fidgety/restless - - - - - - 0  Suicidal thoughts - - - - - - 0  PHQ-9 Score - - - - - - 0    Review of  Systems  Constitutional: Negative.   HENT: Negative.   Eyes: Negative.   Respiratory: Negative.   Cardiovascular: Negative.   Gastrointestinal: Negative.   Endocrine: Negative.   Genitourinary: Negative.   Musculoskeletal: Positive for arthralgias and back pain.  Skin: Negative.   Allergic/Immunologic: Negative.   Neurological: Negative.   Hematological: Negative.   Psychiatric/Behavioral: Negative.   All other systems reviewed and are negative.      Objective:   Physical Exam Vitals signs and nursing note reviewed.  Constitutional:      Appearance: Normal appearance.  Neck:     Musculoskeletal: Neck supple.  Cardiovascular:     Rate and Rhythm: Normal rate and regular rhythm.     Pulses: Normal pulses.     Heart sounds: Normal heart sounds.  Pulmonary:     Effort: Pulmonary effort is normal.     Breath sounds: Normal breath sounds.  Musculoskeletal:     Comments: Normal Muscle Bulk and Muscle Testing Reveals:  Upper Extremities: Full ROM and Muscle Strength 5/5  Lumbar Paraspinal Tenderness: L-3-L-5 Lower Extremities: Full ROM and Muscle Strength 5/5 Arises from Table with ease Narrow Based  Gait   Skin:    General: Skin is warm and dry.  Neurological:     Mental Status: He is alert and oriented to person, place, and time.  Psychiatric:        Mood and Affect: Mood normal.        Behavior: Behavior normal.           Assessment & Plan:  1. Chronic low back pain/ Lumbar Radiculopathy: Continue with HomeExercise Regime.12/07/2018 2. Degenerative disk disease lumbar spine:12/07/2018 Refilled: Morphine ( MS Contin)30mg  one tablet every 12 hours #60and Percocet 7.5/325 mg one tablet every 8 hours prn #90. Second script e-scribe for the following month. 12/04/2018 We will continue the opioid monitoring program, this consists of regular clinic visits, examinations, urine drug screen, pill counts as well as use of [place] Controlled Substance Reporting  System.  15 minutes of face to face patient care time was spent during this visit. All questions were encouraged and answered.   F/U in2Months

## 2018-12-07 NOTE — Telephone Encounter (Signed)
Mr Legate is requesting Zella Ball call the pharmacy and tell them to fill morphine sulfate even though it is only two days early they will not do it and he needs to pick up both meds at the same time.  Pmp= Fill Date 11/09/2018 Morphine Sulf Er 30 Mg Tablet #60    11/04/2018 Oxycodon-Acetaminophen 7.5-325 #90

## 2018-12-07 NOTE — Telephone Encounter (Signed)
Placed a call to [facility], spoke with Suanne Marker the pharmacist, she reports Joshua [name]  pick up his medication today at 2:00 p.m.Marland Kitchen

## 2019-02-01 ENCOUNTER — Encounter: Payer: BC Managed Care – PPO | Attending: Physical Medicine & Rehabilitation | Admitting: Registered Nurse

## 2019-02-01 ENCOUNTER — Other Ambulatory Visit: Payer: Self-pay

## 2019-02-01 ENCOUNTER — Encounter: Payer: Self-pay | Admitting: Registered Nurse

## 2019-02-01 VITALS — BP 124/82 | HR 66 | Temp 97.9°F | Ht 68.0 in | Wt 272.0 lb

## 2019-02-01 DIAGNOSIS — Z5181 Encounter for therapeutic drug level monitoring: Secondary | ICD-10-CM | POA: Diagnosis not present

## 2019-02-01 DIAGNOSIS — Z79891 Long term (current) use of opiate analgesic: Secondary | ICD-10-CM

## 2019-02-01 DIAGNOSIS — G894 Chronic pain syndrome: Secondary | ICD-10-CM

## 2019-02-01 DIAGNOSIS — M5416 Radiculopathy, lumbar region: Secondary | ICD-10-CM | POA: Diagnosis not present

## 2019-02-01 DIAGNOSIS — M5116 Intervertebral disc disorders with radiculopathy, lumbar region: Secondary | ICD-10-CM

## 2019-02-01 DIAGNOSIS — Z79899 Other long term (current) drug therapy: Secondary | ICD-10-CM | POA: Insufficient documentation

## 2019-02-01 DIAGNOSIS — M545 Low back pain: Secondary | ICD-10-CM | POA: Diagnosis not present

## 2019-02-01 MED ORDER — MORPHINE SULFATE ER 30 MG PO TBCR: 30.0000 mg | EXTENDED_RELEASE_TABLET | Freq: Two times a day (BID) | ORAL | 0 refills | Status: DC

## 2019-02-01 MED ORDER — OXYCODONE-ACETAMINOPHEN 7.5-325 MG PO TABS: 1.0000 | ORAL_TABLET | Freq: Three times a day (TID) | ORAL | 0 refills | Status: DC | PRN

## 2019-02-01 NOTE — Progress Notes (Signed)
Subjective:    Patient ID: Joshua [name], male    DOB: 05-22-1975, 43 y.o.   MRN: [id number]  HPI: KHALIFA KNECHT is a 43 y.o. male who returns for follow up appointment for chronic pain and medication refill. He states his pain is located in his lower back radiating into his left lower extremity occasionally. He rates his pain 4. His current exercise regime is walking and performing stretching exercises.  Mr. Goldwire Morphine equivalent is 93.75 MME.  UDS ordered today.   Pain Inventory Average Pain 4 Pain Right Now 4 My pain is sharp, stabbing and aching  In the last 24 hours, has pain interfered with the following? General activity 5 Relation with others 4 Enjoyment of life 5 What TIME of day is your pain at its worst? daytime Sleep (in general) Fair  Pain is worse with: walking, bending, sitting and standing Pain improves with: rest, therapy/exercise and medication Relief from Meds: 5  Mobility walk without assistance ability to climb steps?  yes do you drive?  yes  Function employed # of hrs/week 40  Neuro/Psych No problems in this area  Prior Studies Any changes since last visit?  no  Physicians involved in your care Any changes since last visit?  no   Family History  Problem Relation Age of Onset  . Hypertension Mother   . Diabetes Father    Social History   Socioeconomic History  . Marital status: Single    Spouse name: Not on file  . Number of children: Not on file  . Years of education: Not on file  . Highest education level: Not on file  Occupational History  . Occupation: Film/video editor  Social Needs  . Financial resource strain: Not on file  . Food insecurity    Worry: Not on file    Inability: Not on file  . Transportation needs    Medical: Not on file    Non-medical: Not on file  Tobacco Use  . Smoking status: Former Smoker    Packs/day: 1.00    Years: 25.00    Pack years: 25.00    Quit date: 10/23/2013    Years since  quitting: 5.2  . Smokeless tobacco: Current User  . Tobacco comment: Currently uses Vapor cig daily  Substance and Sexual Activity  . Alcohol use: No    Alcohol/week: 0.0 standard drinks  . Drug use: No  . Sexual activity: Yes    Birth control/protection: Condom  Lifestyle  . Physical activity    Days per week: Not on file    Minutes per session: Not on file  . Stress: Not on file  Relationships  . Social Herbalist on phone: Not on file    Gets together: Not on file    Attends religious service: Not on file    Active member of club or organization: Not on file    Attends meetings of clubs or organizations: Not on file    Relationship status: Not on file  Other Topics Concern  . Not on file  Social History Narrative  . Not on file   Past Surgical History:  Procedure Laterality Date  . KNEE SURGERY Left 2010   Past Medical History:  Diagnosis Date  . Disorders of sacrum   . Displacement of lumbar intervertebral disc without myelopathy   . Facet syndrome, lumbar   . Lumbago   . Thoracic or lumbosacral neuritis or radiculitis, unspecified    There  were no vitals taken for this visit.  Opioid Risk Score:   Fall Risk Score:  `1  Depression screen PHQ 2/9  Depression screen [facility] 2/9 10/06/2018 12/09/2017 07/14/2017 04/09/2017 01/24/2016 11/27/2015 06/10/2014  Decreased Interest 0 0 0 0 0 0 0  Down, Depressed, Hopeless 0 0 0 0 0 0 0  PHQ - 2 Score 0 0 0 0 0 0 0  Altered sleeping - - - - - - 0  Tired, decreased energy - - - - - - 0  Change in appetite - - - - - - 0  Feeling bad or failure about yourself  - - - - - - 0  Trouble concentrating - - - - - - 0  Moving slowly or fidgety/restless - - - - - - 0  Suicidal thoughts - - - - - - 0  PHQ-9 Score - - - - - - 0     Review of Systems  Constitutional: Negative.   HENT: Negative.   Eyes: Negative.   Respiratory: Negative.   Cardiovascular: Negative.   Gastrointestinal: Negative.   Endocrine: Negative.    Genitourinary: Negative.   Musculoskeletal: Positive for arthralgias and back pain.  Skin: Negative.   Allergic/Immunologic: Negative.   Neurological: Negative.   Hematological: Negative.   Psychiatric/Behavioral: Negative.   All other systems reviewed and are negative.      Objective:   Physical Exam Vitals signs and nursing note reviewed.  Constitutional:      Appearance: Normal appearance.  Neck:     Musculoskeletal: Normal range of motion and neck supple.  Cardiovascular:     Rate and Rhythm: Normal rate and regular rhythm.     Pulses: Normal pulses.     Heart sounds: Normal heart sounds.  Pulmonary:     Effort: Pulmonary effort is normal.     Breath sounds: Normal breath sounds.  Musculoskeletal:     Comments: Normal Muscle Bulk and Muscle Testing Reveals:  Upper Extremities: Full ROM and Muscle Strength 5/5  Lumbar Paraspinal Tenderness: L-3-L-5 Lower Extremities: Full ROM and Muscle Strength 5/5 Arises from chair with ease Narrow Based  Gait  Skin:    General: Skin is warm and dry.  Neurological:     Mental Status: He is alert and oriented to person, place, and time.  Psychiatric:        Mood and Affect: Mood normal.        Behavior: Behavior normal.           Assessment & Plan:  1. Chronic low back pain/ Lumbar Radiculopathy: Continue with HomeExercise Regime.02/01/2019 2. Degenerative disk disease lumbar spine:02/01/2019 Refilled: Morphine ( MS Contin)30mg  one tablet every 12 hours #60and Percocet 7.5/325 mg one tablet every 8 hours prn #90. Second script e-scribe for the following month. 02/01/2019 We will continue the opioid monitoring program, this consists of regular clinic visits, examinations, urine drug screen, pill counts as well as use of [place] Controlled Substance Reporting System.  15 minutes of face to face patient care time was spent during this visit. All questions were encouraged and answered.   F/U in2Months

## 2019-02-06 LAB — TOXASSURE SELECT,+ANTIDEPR,UR

## 2019-02-08 ENCOUNTER — Telehealth: Payer: Self-pay | Admitting: *Deleted

## 2019-02-08 NOTE — Telephone Encounter (Signed)
Urine drug screen for this encounter is consistent for prescribed medication. However it is positive for alcohol. A formal warning letter will be sent through [facility] and mailed to Joshua [name].

## 2019-04-02 ENCOUNTER — Encounter: Payer: BC Managed Care – PPO | Attending: Physical Medicine & Rehabilitation | Admitting: Registered Nurse

## 2019-04-02 ENCOUNTER — Other Ambulatory Visit: Payer: Self-pay

## 2019-04-02 ENCOUNTER — Encounter: Payer: Self-pay | Admitting: Registered Nurse

## 2019-04-02 VITALS — BP 157/84 | HR 68 | Temp 98.7°F | Ht 68.0 in | Wt 278.2 lb

## 2019-04-02 DIAGNOSIS — G894 Chronic pain syndrome: Secondary | ICD-10-CM

## 2019-04-02 DIAGNOSIS — Z5181 Encounter for therapeutic drug level monitoring: Secondary | ICD-10-CM

## 2019-04-02 DIAGNOSIS — M545 Low back pain: Secondary | ICD-10-CM | POA: Insufficient documentation

## 2019-04-02 DIAGNOSIS — M5116 Intervertebral disc disorders with radiculopathy, lumbar region: Secondary | ICD-10-CM | POA: Diagnosis not present

## 2019-04-02 DIAGNOSIS — Z79891 Long term (current) use of opiate analgesic: Secondary | ICD-10-CM | POA: Diagnosis not present

## 2019-04-02 DIAGNOSIS — M5416 Radiculopathy, lumbar region: Secondary | ICD-10-CM | POA: Diagnosis not present

## 2019-04-02 DIAGNOSIS — Z79899 Other long term (current) drug therapy: Secondary | ICD-10-CM | POA: Insufficient documentation

## 2019-04-02 MED ORDER — OXYCODONE-ACETAMINOPHEN 7.5-325 MG PO TABS: 1.0000 | ORAL_TABLET | Freq: Three times a day (TID) | ORAL | 0 refills | Status: DC | PRN

## 2019-04-02 MED ORDER — MORPHINE SULFATE ER 30 MG PO TBCR: 30.0000 mg | EXTENDED_RELEASE_TABLET | Freq: Two times a day (BID) | ORAL | 0 refills | Status: DC

## 2019-04-02 NOTE — Progress Notes (Signed)
Subjective:    Patient ID: Joshua [name], male    DOB: 11/21/1975, 44 y.o.   MRN: [id number]  HPI: Joshua [name] is a 44 y.o. male who returns for follow up appointment for chronic pain and medication refill. He states his pain is located in his lower back and occasionally radiating into his left lower extremity. He rates his pain 5. His current exercise regime is walking and performing stretching exercises.  Mr. Pozzi Morphine equivalent is 93.75 MME.  Last UDS was Performed on 02/01/2019, it was consistent.    Pain Inventory Average Pain 4 Pain Right Now 5 My pain is sharp, stabbing and aching  In the last 24 hours, has pain interfered with the following? General activity 4 Relation with others 4 Enjoyment of life 4 What TIME of day is your pain at its worst? daytime and evening Sleep (in general) Fair  Pain is worse with: walking, bending, sitting, standing and some activites Pain improves with: therapy/exercise and medication Relief from Meds: 5  Mobility walk with assistance ability to climb steps?  yes do you drive?  yes  Function employed # of hrs/week 30-40  Neuro/Psych No problems in this area  Prior Studies Any changes since last visit?  no  Physicians involved in your care Any changes since last visit?  no   Family History  Problem Relation Age of Onset  . Hypertension Mother   . Diabetes Father    Social History   Socioeconomic History  . Marital status: Single    Spouse name: Not on file  . Number of children: Not on file  . Years of education: Not on file  . Highest education level: Not on file  Occupational History  . Occupation: Film/video editor  Tobacco Use  . Smoking status: Former Smoker    Packs/day: 1.00    Years: 25.00    Pack years: 25.00    Quit date: 10/23/2013    Years since quitting: 5.4  . Smokeless tobacco: Current User  . Tobacco comment: Currently uses Vapor cig daily  Substance and Sexual Activity  . Alcohol  use: No    Alcohol/week: 0.0 standard drinks  . Drug use: No  . Sexual activity: Yes    Birth control/protection: Condom  Other Topics Concern  . Not on file  Social History Narrative  . Not on file   Social Determinants of Health   Financial Resource Strain:   . Difficulty of Paying Living Expenses: Not on file  Food Insecurity:   . Worried About Charity fundraiser in the Last Year: Not on file  . Ran Out of Food in the Last Year: Not on file  Transportation Needs:   . Lack of Transportation (Medical): Not on file  . Lack of Transportation (Non-Medical): Not on file  Physical Activity:   . Days of Exercise per Week: Not on file  . Minutes of Exercise per Session: Not on file  Stress:   . Feeling of Stress : Not on file  Social Connections:   . Frequency of Communication with Friends and Family: Not on file  . Frequency of Social Gatherings with Friends and Family: Not on file  . Attends Religious Services: Not on file  . Active Member of Clubs or Organizations: Not on file  . Attends Archivist Meetings: Not on file  . Marital Status: Not on file   Past Surgical History:  Procedure Laterality Date  . KNEE SURGERY Left 2010  Past Medical History:  Diagnosis Date  . Disorders of sacrum   . Displacement of lumbar intervertebral disc without myelopathy   . Facet syndrome, lumbar   . Lumbago   . Thoracic or lumbosacral neuritis or radiculitis, unspecified    BP (!) 157/84   Pulse 68   Temp 98.7 F (37.1 C)   Ht 5\' 8"  (1.727 m)   Wt 278 lb 3.2 oz (126.2 kg)   SpO2 97%   BMI 42.30 kg/m   Opioid Risk Score:   Fall Risk Score:  `1  Depression screen PHQ 2/9  Depression screen [facility] 2/9 10/06/2018 12/09/2017 07/14/2017 04/09/2017 01/24/2016 11/27/2015 06/10/2014  Decreased Interest 0 0 0 0 0 0 0  Down, Depressed, Hopeless 0 0 0 0 0 0 0  PHQ - 2 Score 0 0 0 0 0 0 0  Altered sleeping - - - - - - 0  Tired, decreased energy - - - - - - 0  Change in appetite - -  - - - - 0  Feeling bad or failure about yourself  - - - - - - 0  Trouble concentrating - - - - - - 0  Moving slowly or fidgety/restless - - - - - - 0  Suicidal thoughts - - - - - - 0  PHQ-9 Score - - - - - - 0      Review of Systems  All other systems reviewed and are negative.      Objective:   Physical Exam Vitals and nursing note reviewed.  Constitutional:      Appearance: Normal appearance.  Cardiovascular:     Rate and Rhythm: Normal rate and regular rhythm.     Pulses: Normal pulses.     Heart sounds: Normal heart sounds.  Pulmonary:     Effort: Pulmonary effort is normal.     Breath sounds: Normal breath sounds.  Musculoskeletal:     Cervical back: Normal range of motion and neck supple.     Comments: Normal Muscle Bulk and Muscle Testing Reveals:  Upper Extremities: Full ROM and Muscle Strength 5/5   Lumbar Paraspinal Tenderness: L-3-L-5 Lower Extremities: Full ROM and Muscle Strength 5/5 Arises from chair with ease Narrow Based Gait   Skin:    General: Skin is warm and dry.  Neurological:     Mental Status: He is alert and oriented to person, place, and time.  Psychiatric:        Mood and Affect: Mood normal.        Behavior: Behavior normal.           Assessment & Plan:  1. Chronic low back pain/ Lumbar Radiculopathy: Continue with HomeExercise Regime.04/02/2019 2. Degenerative disk disease lumbar spine:04/02/2019 Refilled: Morphine ( MS Contin)30mg  one tablet every 12 hours #60and Percocet 7.5/325 mg one tablet every 8 hours prn #90. Second script e-scribe for the following month. 04/02/2019 We will continue the opioid monitoring program, this consists of regular clinic visits, examinations, urine drug screen, pill counts as well as use of 04/04/2019 Controlled Substance Reporting System.  [place] of face to face patient care time was spent during this visit. All questions were encouraged and answered.   F/U in2Months

## 2019-05-28 ENCOUNTER — Other Ambulatory Visit: Payer: Self-pay

## 2019-05-28 ENCOUNTER — Encounter: Payer: BC Managed Care – PPO | Attending: Physical Medicine & Rehabilitation | Admitting: Registered Nurse

## 2019-05-28 ENCOUNTER — Encounter: Payer: Self-pay | Admitting: Registered Nurse

## 2019-05-28 VITALS — BP 143/78 | HR 73 | Temp 97.9°F | Ht 68.0 in | Wt 277.0 lb

## 2019-05-28 DIAGNOSIS — M545 Low back pain: Secondary | ICD-10-CM | POA: Insufficient documentation

## 2019-05-28 DIAGNOSIS — M5116 Intervertebral disc disorders with radiculopathy, lumbar region: Secondary | ICD-10-CM | POA: Diagnosis not present

## 2019-05-28 DIAGNOSIS — G894 Chronic pain syndrome: Secondary | ICD-10-CM | POA: Diagnosis not present

## 2019-05-28 DIAGNOSIS — Z79891 Long term (current) use of opiate analgesic: Secondary | ICD-10-CM | POA: Diagnosis not present

## 2019-05-28 DIAGNOSIS — Z5181 Encounter for therapeutic drug level monitoring: Secondary | ICD-10-CM | POA: Diagnosis not present

## 2019-05-28 DIAGNOSIS — Z79899 Other long term (current) drug therapy: Secondary | ICD-10-CM | POA: Insufficient documentation

## 2019-05-28 DIAGNOSIS — M5416 Radiculopathy, lumbar region: Secondary | ICD-10-CM

## 2019-05-28 MED ORDER — MORPHINE SULFATE ER 30 MG PO TBCR: 30.0000 mg | EXTENDED_RELEASE_TABLET | Freq: Two times a day (BID) | ORAL | 0 refills | Status: DC

## 2019-05-28 MED ORDER — OXYCODONE-ACETAMINOPHEN 7.5-325 MG PO TABS: 1.0000 | ORAL_TABLET | Freq: Three times a day (TID) | ORAL | 0 refills | Status: DC | PRN

## 2019-05-28 NOTE — Progress Notes (Signed)
Subjective:    Patient ID: Joshua [name], male    DOB: 05-01-1975, 44 y.o.   MRN: [id number]  HPI: Joshua [name] is a 44 y.o. male who returns for follow up appointment for chronic pain and medication refill. He states his pain is located in his lower back radiating into his left lower extremity. He rates his pain 4. His current exercise regime is walking and performing stretching exercises.  Mr. Montesinos Morphine equivalent is 93.75  MME.  UDS ordered for Today.    Pain Inventory Average Pain 5 Pain Right Now 4 My pain is sharp, stabbing and aching  In the last 24 hours, has pain interfered with the following? General activity 4 Relation with others 5 Enjoyment of life 5 What TIME of day is your pain at its worst? daytime, evening Sleep (in general) Fair  Pain is worse with: walking, bending, sitting, standing and some activites Pain improves with: rest, therapy/exercise and medication Relief from Meds: 5  Mobility walk without assistance how many minutes can you walk? 40 ability to climb steps?  yes do you drive?  yes transfers alone Do you have any goals in this area?  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  no  Physicians involved in your care Any changes since last visit?  no   Family History  Problem Relation Age of Onset  . Hypertension Mother   . Diabetes Father    Social History   Socioeconomic History  . Marital status: Single    Spouse name: Not on file  . Number of children: Not on file  . Years of education: Not on file  . Highest education level: Not on file  Occupational History  . Occupation: Film/video editor  Tobacco Use  . Smoking status: Former Smoker    Packs/day: 1.00    Years: 25.00    Pack years: 25.00    Quit date: 10/23/2013    Years since quitting: 5.5  . Smokeless tobacco: Current User  . Tobacco  comment: Currently uses Vapor cig daily  Substance and Sexual Activity  . Alcohol use: No    Alcohol/week: 0.0 standard drinks  . Drug use: No  . Sexual activity: Yes    Birth control/protection: Condom  Other Topics Concern  . Not on file  Social History Narrative  . Not on file   Social Determinants of Health   Financial Resource Strain:   . Difficulty of Paying Living Expenses:   Food Insecurity:   . Worried About Charity fundraiser in the Last Year:   . Arboriculturist in the Last Year:   Transportation Needs:   . Film/video editor (Medical):   Marland Kitchen Lack of Transportation (Non-Medical):   Physical Activity:   . Days of Exercise per Week:   . Minutes of Exercise per Session:   Stress:   . Feeling of Stress :   Social Connections:   . Frequency of Communication with Friends and Family:   . Frequency of Social Gatherings with Friends and Family:   . Attends Religious Services:   . Active Member of Clubs or Organizations:   . Attends Archivist Meetings:   Marland Kitchen Marital Status:    Past Surgical History:  Procedure Laterality Date  . KNEE SURGERY Left 2010   Past Medical History:  Diagnosis Date  .  Disorders of sacrum   . Displacement of lumbar intervertebral disc without myelopathy   . Facet syndrome, lumbar   . Lumbago   . Thoracic or lumbosacral neuritis or radiculitis, unspecified    BP (!) 143/78   Pulse 73   Temp 97.9 F (36.6 C)   Ht 5\' 8"  (1.727 m)   Wt 277 lb (125.6 kg)   SpO2 96%   BMI 42.12 kg/m   Opioid Risk Score:   Fall Risk Score:  `1  Depression screen PHQ 2/9  Depression screen [facility] 2/9 10/06/2018 12/09/2017 07/14/2017 04/09/2017 01/24/2016 11/27/2015 06/10/2014  Decreased Interest 0 0 0 0 0 0 0  Down, Depressed, Hopeless 0 0 0 0 0 0 0  PHQ - 2 Score 0 0 0 0 0 0 0  Altered sleeping - - - - - - 0  Tired, decreased energy - - - - - - 0  Change in appetite - - - - - - 0  Feeling bad or failure about yourself  - - - - - - 0  Trouble  concentrating - - - - - - 0  Moving slowly or fidgety/restless - - - - - - 0  Suicidal thoughts - - - - - - 0  PHQ-9 Score - - - - - - 0    Review of Systems  Constitutional: Negative.   HENT: Negative.   Eyes: Negative.   Respiratory: Negative.   Cardiovascular: Negative.   Gastrointestinal: Negative.   Endocrine: Negative.   Genitourinary: Negative.   Musculoskeletal: Positive for back pain.  Skin: Negative.   Allergic/Immunologic: Negative.   Neurological: Negative.   Hematological: Negative.   Psychiatric/Behavioral: Negative.   All other systems reviewed and are negative.      Objective:   Physical Exam Vitals and nursing note reviewed.  Constitutional:      Appearance: Normal appearance. He is obese.  Cardiovascular:     Rate and Rhythm: Normal rate and regular rhythm.     Pulses: Normal pulses.     Heart sounds: Normal heart sounds.  Pulmonary:     Effort: Pulmonary effort is normal.     Breath sounds: Normal breath sounds.  Musculoskeletal:     Cervical back: Normal range of motion and neck supple.  Skin:    General: Skin is warm and dry.  Neurological:     Mental Status: He is alert and oriented to person, place, and time.  Psychiatric:        Mood and Affect: Mood normal.        Behavior: Behavior normal.           Assessment & Plan:  1. Chronic low back pain/ Lumbar Radiculopathy: Continue with HomeExercise Regime.05/28/2019 2. Degenerative disk disease lumbar spine:05/28/2019 Refilled: Morphine ( MS Contin)30mg  one tablet every 12 hours #60and Percocet 7.5/325 mg one tablet every 8 hours prn #90. Second script e-scribe for the following month.05/28/2019 We will continue the opioid monitoring program, this consists of regular clinic visits, examinations, urine drug screen, pill counts as well as use of 05/30/2019 Controlled Substance Reporting System.  [place] of face to face patient care time was spent during this visit. All  questions were encouraged and answered.   F/U in2Months

## 2019-06-02 LAB — TOXASSURE SELECT,+ANTIDEPR,UR

## 2019-06-03 ENCOUNTER — Telehealth: Payer: Self-pay

## 2019-06-03 NOTE — Telephone Encounter (Signed)
UDS RESULTS CONSISTENT WITH MEDICATIONS ON FILE  

## 2019-07-16 ENCOUNTER — Other Ambulatory Visit: Payer: Self-pay

## 2019-07-16 ENCOUNTER — Encounter: Payer: BC Managed Care – PPO | Attending: Physical Medicine & Rehabilitation | Admitting: Registered Nurse

## 2019-07-16 ENCOUNTER — Encounter: Payer: Self-pay | Admitting: Registered Nurse

## 2019-07-16 VITALS — BP 158/88 | HR 72 | Temp 98.1°F | Ht 68.0 in | Wt 270.8 lb

## 2019-07-16 DIAGNOSIS — G894 Chronic pain syndrome: Secondary | ICD-10-CM | POA: Insufficient documentation

## 2019-07-16 DIAGNOSIS — Z5181 Encounter for therapeutic drug level monitoring: Secondary | ICD-10-CM | POA: Diagnosis not present

## 2019-07-16 DIAGNOSIS — Z720 Tobacco use: Secondary | ICD-10-CM

## 2019-07-16 DIAGNOSIS — M5116 Intervertebral disc disorders with radiculopathy, lumbar region: Secondary | ICD-10-CM | POA: Diagnosis not present

## 2019-07-16 DIAGNOSIS — M545 Low back pain: Secondary | ICD-10-CM | POA: Insufficient documentation

## 2019-07-16 DIAGNOSIS — M5416 Radiculopathy, lumbar region: Secondary | ICD-10-CM

## 2019-07-16 DIAGNOSIS — Z79891 Long term (current) use of opiate analgesic: Secondary | ICD-10-CM

## 2019-07-16 DIAGNOSIS — Z79899 Other long term (current) drug therapy: Secondary | ICD-10-CM | POA: Diagnosis not present

## 2019-07-16 MED ORDER — MORPHINE SULFATE ER 30 MG PO TBCR: 30.0000 mg | EXTENDED_RELEASE_TABLET | Freq: Two times a day (BID) | ORAL | 0 refills | Status: DC

## 2019-07-16 MED ORDER — OXYCODONE-ACETAMINOPHEN 7.5-325 MG PO TABS: 1.0000 | ORAL_TABLET | Freq: Three times a day (TID) | ORAL | 0 refills | Status: DC | PRN

## 2019-07-16 NOTE — Progress Notes (Signed)
Subjective:    Patient ID: Joshua [name], male    DOB: [birth year], 44 y.o.   MRN: [id number]  HPI: Joshua [name] is a 44 y.o. male who returns for follow up appointment for chronic pain and medication refill. He states his pain is located in his lower back occasionally radiates into his left lower extremity. He rates his pain 5. His current exercise regime is walking and performing stretching exercises.  Mr. Lips Morphine equivalent is 93.75 MME.  Last UDS was Performed on 05/28/2019, it was consistent.    Pain Inventory Average Pain 4 Pain Right Now 5 My pain is sharp, stabbing and aching  In the last 24 hours, has pain interfered with the following? General activity 4 Relation with others 4 Enjoyment of life 3 What TIME of day is your pain at its worst? evening Sleep (in general) NA  Pain is worse with: walking, bending, sitting and standing Pain improves with: rest, heat/ice and medication Relief from Meds: 6  Mobility walk without assistance how many minutes can you walk? 30 ability to climb steps?  yes do you drive?  yes  Function employed # of hrs/week 30=40  Neuro/Psych No problems in this area  Prior Studies Any changes since last visit?  no  Physicians involved in your care Any changes since last visit?  no   Family History  Problem Relation Age of Onset  . Hypertension Mother   . Diabetes Father    Social History   Socioeconomic History  . Marital status: Single    Spouse name: Not on file  . Number of children: Not on file  . Years of education: Not on file  . Highest education level: Not on file  Occupational History  . Occupation: Biochemist, clinical  Tobacco Use  . Smoking status: Former Smoker    Packs/day: 1.00    Years: 25.00    Pack years: 25.00    Quit date: 10/23/2013    Years since quitting: 5.7  . Smokeless tobacco: Current User  . Tobacco comment: Currently uses Vapor cig daily  Substance and Sexual Activity  .  Alcohol use: No    Alcohol/week: 0.0 standard drinks  . Drug use: No  . Sexual activity: Yes    Birth control/protection: Condom  Other Topics Concern  . Not on file  Social History Narrative  . Not on file   Social Determinants of Health   Financial Resource Strain:   . Difficulty of Paying Living Expenses:   Food Insecurity:   . Worried About Programme researcher, broadcasting/film/video in the Last Year:   . Barista in the Last Year:   Transportation Needs:   . Freight forwarder (Medical):   Marland Kitchen Lack of Transportation (Non-Medical):   Physical Activity:   . Days of Exercise per Week:   . Minutes of Exercise per Session:   Stress:   . Feeling of Stress :   Social Connections:   . Frequency of Communication with Friends and Family:   . Frequency of Social Gatherings with Friends and Family:   . Attends Religious Services:   . Active Member of Clubs or Organizations:   . Attends Banker Meetings:   Marland Kitchen Marital Status:    Past Surgical History:  Procedure Laterality Date  . KNEE SURGERY Left 2010   Past Medical History:  Diagnosis Date  . Disorders of sacrum   . Displacement of lumbar intervertebral disc without myelopathy   .  Facet syndrome, lumbar   . Lumbago   . Thoracic or lumbosacral neuritis or radiculitis, unspecified    Pulse 72   Temp 98.1 F (36.7 C)   Ht 5\' 8"  (1.727 m)   Wt 270 lb 12.8 oz (122.8 kg)   SpO2 96%   BMI 41.17 kg/m   Opioid Risk Score:   Fall Risk Score:  `1  Depression screen PHQ 2/9  Depression screen [facility] 2/9 07/16/2019 10/06/2018 12/09/2017 07/14/2017 04/09/2017 01/24/2016 11/27/2015  Decreased Interest 0 0 0 0 0 0 0  Down, Depressed, Hopeless 0 0 0 0 0 0 0  PHQ - 2 Score 0 0 0 0 0 0 0  Altered sleeping - - - - - - -  Tired, decreased energy - - - - - - -  Change in appetite - - - - - - -  Feeling bad or failure about yourself  - - - - - - -  Trouble concentrating - - - - - - -  Moving slowly or fidgety/restless - - - - - - -    Suicidal thoughts - - - - - - -  PHQ-9 Score - - - - - - -   Review of Systems  Constitutional: Negative.   HENT: Negative.   Eyes: Negative.   Respiratory: Negative.   Cardiovascular: Negative.   Gastrointestinal: Negative.   Endocrine: Negative.   Genitourinary: Negative.   Musculoskeletal: Negative.   Skin: Negative.   Allergic/Immunologic: Negative.   Neurological: Negative.   Hematological: Negative.   Psychiatric/Behavioral: Negative.   All other systems reviewed and are negative.      Objective:   Physical Exam Vitals and nursing note reviewed.  Constitutional:      Appearance: Normal appearance. He is obese.  Cardiovascular:     Rate and Rhythm: Normal rate and regular rhythm.     Pulses: Normal pulses.     Heart sounds: Normal heart sounds.  Pulmonary:     Effort: Pulmonary effort is normal.     Breath sounds: Normal breath sounds.  Musculoskeletal:     Cervical back: Normal range of motion and neck supple.     Comments: Normal Muscle Bulk and Muscle Testing Reveals:  Upper Extremities: Full ROM and Muscle Strength 5/5  Lumbar Paraspinal Tenderness: L-3-L-5 Lower Extremities: Full ROM and Muscle Strength 5/5 Arises from chair with Ease Narrow Based  Gait   Skin:    General: Skin is warm and dry.  Neurological:     Mental Status: He is alert and oriented to person, place, and time.  Psychiatric:        Mood and Affect: Mood normal.        Behavior: Behavior normal.           Assessment & Plan:  1. Chronic low back pain/ Lumbar Radiculopathy: Continue with HomeExercise Regime.07/16/2019 2. Degenerative disk disease lumbar spine:07/16/2019 Refilled: Morphine ( MS Contin)30mg  one tablet every 12 hours #60and Percocet 7.5/325 mg one tablet every 8 hours prn #90. Second script e-scribe for the following month.07/16/2019 We will continue the opioid monitoring program, this consists of regular clinic visits, examinations, urine drug screen, pill  counts as well as use of [place] Controlled Substance Reporting System. 3. Tobacco Abuse: Educated on smoking cessation: He verbalizes understanding.   57minutes of face to face patient care time was spent during this visit. All questions were encouraged and answered.   F/U in2Months

## 2019-09-24 ENCOUNTER — Encounter: Payer: BC Managed Care – PPO | Attending: Physical Medicine & Rehabilitation | Admitting: Registered Nurse

## 2019-09-24 ENCOUNTER — Other Ambulatory Visit: Payer: Self-pay

## 2019-09-24 ENCOUNTER — Encounter: Payer: Self-pay | Admitting: Registered Nurse

## 2019-09-24 VITALS — BP 147/85 | HR 62 | Temp 98.0°F | Ht 68.0 in | Wt 269.4 lb

## 2019-09-24 DIAGNOSIS — Z79891 Long term (current) use of opiate analgesic: Secondary | ICD-10-CM | POA: Diagnosis not present

## 2019-09-24 DIAGNOSIS — M5116 Intervertebral disc disorders with radiculopathy, lumbar region: Secondary | ICD-10-CM | POA: Diagnosis not present

## 2019-09-24 DIAGNOSIS — M545 Low back pain: Secondary | ICD-10-CM | POA: Insufficient documentation

## 2019-09-24 DIAGNOSIS — Z79899 Other long term (current) drug therapy: Secondary | ICD-10-CM | POA: Insufficient documentation

## 2019-09-24 DIAGNOSIS — G894 Chronic pain syndrome: Secondary | ICD-10-CM | POA: Insufficient documentation

## 2019-09-24 DIAGNOSIS — Z5181 Encounter for therapeutic drug level monitoring: Secondary | ICD-10-CM | POA: Insufficient documentation

## 2019-09-24 MED ORDER — OXYCODONE-ACETAMINOPHEN 7.5-325 MG PO TABS: 1.0000 | ORAL_TABLET | Freq: Three times a day (TID) | ORAL | 0 refills | Status: DC | PRN

## 2019-09-24 MED ORDER — MORPHINE SULFATE ER 30 MG PO TBCR: 30.0000 mg | EXTENDED_RELEASE_TABLET | Freq: Two times a day (BID) | ORAL | 0 refills | Status: DC

## 2019-09-24 NOTE — Progress Notes (Signed)
Subjective:    Patient ID: Joshua [name], male    DOB: 06/19/1975, 44 y.o.   MRN: [id number]  HPI: Joshua [name] is a 44 y.o. male who returns for follow up appointment for chronic pain and medication refill. He states his pain is located in his lower back and occasionally it radiates into his left lower extremity. He rates hispain 4. His current exercise regime is walking and performing stretching exercises.  Joshua [name] Morphine equivalent is 93.75  MME.  UDS ordered for today.    Pain Inventory Average Pain 4 Pain Right Now 4 My pain is constant, sharp, stabbing and aching  In the last 24 hours, has pain interfered with the following? General activity 5 Relation with others 4 Enjoyment of life 4 What TIME of day is your pain at its worst? daytime & evening. Sleep (in general) Fair  Pain is worse with: walking, sitting, standing and some activites Pain improves with: rest, therapy/exercise and medication Relief from Meds: 6  Mobility how many minutes can you walk? 30 mins ability to climb steps?  yes do you drive?  yes Do you have any goals in this area?  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  no  Physicians involved in your care Any changes since last visit?  no   Family History  Problem Relation Age of Onset  . Hypertension Mother   . Diabetes Father    Social History   Socioeconomic History  . Marital status: Single    Spouse name: Not on file  . Number of children: Not on file  . Years of education: Not on file  . Highest education level: Not on file  Occupational History  . Occupation: Biochemist, clinical  Tobacco Use  . Smoking status: Former Smoker    Packs/day: 1.00    Years: 25.00    Pack years: 25.00    Quit date: 10/23/2013    Years since quitting: 5.9  . Smokeless tobacco: Current User  . Tobacco  comment: Currently uses Vapor cig daily  Vaping Use  . Vaping Use: Never used  Substance and Sexual Activity  . Alcohol use: No    Alcohol/week: 0.0 standard drinks  . Drug use: No  . Sexual activity: Yes    Birth control/protection: Condom  Other Topics Concern  . Not on file  Social History Narrative  . Not on file   Social Determinants of Health   Financial Resource Strain:   . Difficulty of Paying Living Expenses:   Food Insecurity:   . Worried About Programme researcher, broadcasting/film/video in the Last Year:   . Barista in the Last Year:   Transportation Needs:   . Freight forwarder (Medical):   Marland Kitchen Lack of Transportation (Non-Medical):   Physical Activity:   . Days of Exercise per Week:   . Minutes of Exercise per Session:   Stress:   . Feeling of Stress :   Social Connections:   . Frequency of Communication with Friends and Family:   . Frequency of Social Gatherings with Friends and Family:   . Attends Religious Services:   . Active Member of Clubs or Organizations:   . Attends Banker Meetings:   Marland Kitchen Marital Status:    Past Surgical History:  Procedure Laterality Date  . KNEE SURGERY  Left 2010   Past Medical History:  Diagnosis Date  . Disorders of sacrum   . Displacement of lumbar intervertebral disc without myelopathy   . Facet syndrome, lumbar   . Lumbago   . Thoracic or lumbosacral neuritis or radiculitis, unspecified    BP (!) 147/85   Pulse 62   Temp 98 F (36.7 C)   Ht 5\' 8"  (1.727 m)   Wt 269 lb 6.4 oz (122.2 kg)   SpO2 98%   BMI 40.96 kg/m   Opioid Risk Score:   Fall Risk Score:  `1  Depression screen PHQ 2/9  Depression screen [facility] 2/9 07/16/2019 10/06/2018 12/09/2017 07/14/2017 04/09/2017 01/24/2016 11/27/2015  Decreased Interest 0 0 0 0 0 0 0  Down, Depressed, Hopeless 0 0 0 0 0 0 0  PHQ - 2 Score 0 0 0 0 0 0 0  Altered sleeping - - - - - - -  Tired, decreased energy - - - - - - -  Change in appetite - - - - - - -  Feeling bad or  failure about yourself  - - - - - - -  Trouble concentrating - - - - - - -  Moving slowly or fidgety/restless - - - - - - -  Suicidal thoughts - - - - - - -  PHQ-9 Score - - - - - - -   Review of Systems  Constitutional: Negative.   HENT: Negative.   Eyes: Negative.   Respiratory: Negative.   Cardiovascular: Negative.   Gastrointestinal: Negative.   Endocrine: Negative.   Genitourinary: Negative.   Musculoskeletal: Positive for back pain.       Lower back pain  Skin: Negative.   Allergic/Immunologic: Negative.   Neurological: Negative.   Hematological: Negative.   Psychiatric/Behavioral: Negative.   All other systems reviewed and are negative.      Objective:   Physical Exam Vitals and nursing note reviewed.  Constitutional:      Appearance: Normal appearance.  Cardiovascular:     Rate and Rhythm: Normal rate and regular rhythm.     Pulses: Normal pulses.     Heart sounds: Normal heart sounds.  Pulmonary:     Effort: Pulmonary effort is normal.     Breath sounds: Normal breath sounds.  Musculoskeletal:     Cervical back: Normal range of motion and neck supple.     Comments: Normal Muscle Bulk and Muscle Testing Reveals:  Upper Extremities: Full ROM and Muscle Strength 5/5  Lumbar Paraspinal Tenderness: L-3-L-5 Lower Extremities: Full ROM and Muscle Strength 5/5 Arises from chair with ease Narrow Based  Gait   Skin:    General: Skin is warm and dry.  Neurological:     Mental Status: He is alert and oriented to person, place, and time.  Psychiatric:        Mood and Affect: Mood normal.        Behavior: Behavior normal.           Assessment & Plan:  1. Chronic low back pain/ Lumbar Radiculopathy: Continue with HomeExercise Regime.09/24/2019 2. Degenerative disk disease lumbar spine:09/24/2019 Refilled: Morphine ( MS Contin)30mg  one tablet every 12 hours #60and Percocet 7.5/325 mg one tablet every 8 hours prn #90. Second script e-scribe for the  following month.09/24/2019 We will continue the opioid monitoring program, this consists of regular clinic visits, examinations, urine drug screen, pill counts as well as use of 09/26/2019 Controlled Substance Reporting System. 3. Tobacco Abuse: Educated  on smoking cessation: He verbalizes understanding. 09/24/2019  10 minutes of face to face patient care time was spent during this visit. All questions were encouraged and answered.   F/U in2Months

## 2019-09-29 LAB — TOXASSURE SELECT,+ANTIDEPR,UR

## 2019-10-05 ENCOUNTER — Telehealth: Payer: Self-pay | Admitting: *Deleted

## 2019-10-05 NOTE — Telephone Encounter (Signed)
Urine drug screen for this encounter is consistent for prescribed medication 

## 2019-11-26 ENCOUNTER — Encounter: Payer: Self-pay | Admitting: Registered Nurse

## 2019-11-26 ENCOUNTER — Encounter: Payer: BC Managed Care – PPO | Attending: Physical Medicine & Rehabilitation | Admitting: Registered Nurse

## 2019-11-26 ENCOUNTER — Other Ambulatory Visit: Payer: Self-pay

## 2019-11-26 VITALS — BP 151/87 | HR 67 | Temp 98.5°F | Ht 68.0 in | Wt 272.0 lb

## 2019-11-26 DIAGNOSIS — Z79891 Long term (current) use of opiate analgesic: Secondary | ICD-10-CM | POA: Diagnosis not present

## 2019-11-26 DIAGNOSIS — M5116 Intervertebral disc disorders with radiculopathy, lumbar region: Secondary | ICD-10-CM | POA: Insufficient documentation

## 2019-11-26 DIAGNOSIS — G894 Chronic pain syndrome: Secondary | ICD-10-CM | POA: Diagnosis not present

## 2019-11-26 DIAGNOSIS — M5416 Radiculopathy, lumbar region: Secondary | ICD-10-CM

## 2019-11-26 DIAGNOSIS — M545 Low back pain: Secondary | ICD-10-CM | POA: Diagnosis not present

## 2019-11-26 DIAGNOSIS — Z5181 Encounter for therapeutic drug level monitoring: Secondary | ICD-10-CM | POA: Diagnosis not present

## 2019-11-26 DIAGNOSIS — Z79899 Other long term (current) drug therapy: Secondary | ICD-10-CM | POA: Insufficient documentation

## 2019-11-26 MED ORDER — MORPHINE SULFATE ER 30 MG PO TBCR: 30.0000 mg | EXTENDED_RELEASE_TABLET | Freq: Two times a day (BID) | ORAL | 0 refills | Status: DC

## 2019-11-26 MED ORDER — OXYCODONE-ACETAMINOPHEN 7.5-325 MG PO TABS: 1.0000 | ORAL_TABLET | Freq: Three times a day (TID) | ORAL | 0 refills | Status: DC | PRN

## 2019-11-26 NOTE — Progress Notes (Signed)
Subjective:    Patient ID: Joshua [name], male    DOB: 1975-07-20, 44 y.o.   MRN: [id number]  HPI: Joshua [name] is a 44 y.o. male who returns for follow up appointment for chronic pain and medication refill. He states his pain is located in his lower back pain radiating into his left lower extremity. He rates his pain 4. His current exercise regime is walking and performing stretching exercises.  Joshua [name] Morphine equivalent is 93.75  MME.  Last UDS was Performed on 09/24/2019, it was consistent.    Pain Inventory Average Pain 4 Pain Right Now 4 My pain is constant, sharp, stabbing and aching  In the last 24 hours, has pain interfered with the following? General activity 4 Relation with others 4 Enjoyment of life 4 What TIME of day is your pain at its worst? daytime and evening Sleep (in general) Fair  Pain is worse with: walking, sitting, standing and some activites Pain improves with: rest, therapy/exercise and medication Relief from Meds: 5  Family History  Problem Relation Age of Onset  . Hypertension Mother   . Diabetes Father    Social History   Socioeconomic History  . Marital status: Single    Spouse name: Not on file  . Number of children: Not on file  . Years of education: Not on file  . Highest education level: Not on file  Occupational History  . Occupation: Biochemist, clinical  Tobacco Use  . Smoking status: Former Smoker    Packs/day: 1.00    Years: 25.00    Pack years: 25.00    Quit date: 10/23/2013    Years since quitting: 6.0  . Smokeless tobacco: Current User  . Tobacco comment: Currently uses Vapor cig daily  Vaping Use  . Vaping Use: Never used  Substance and Sexual Activity  . Alcohol use: No    Alcohol/week: 0.0 standard drinks  . Drug use: No  . Sexual activity: Yes    Birth control/protection: Condom  Other Topics Concern  . Not on file  Social History Narrative  . Not on file   Social Determinants of Health   Financial  Resource Strain:   . Difficulty of Paying Living Expenses: Not on file  Food Insecurity:   . Worried About Programme researcher, broadcasting/film/video in the Last Year: Not on file  . Ran Out of Food in the Last Year: Not on file  Transportation Needs:   . Lack of Transportation (Medical): Not on file  . Lack of Transportation (Non-Medical): Not on file  Physical Activity:   . Days of Exercise per Week: Not on file  . Minutes of Exercise per Session: Not on file  Stress:   . Feeling of Stress : Not on file  Social Connections:   . Frequency of Communication with Friends and Family: Not on file  . Frequency of Social Gatherings with Friends and Family: Not on file  . Attends Religious Services: Not on file  . Active Member of Clubs or Organizations: Not on file  . Attends Banker Meetings: Not on file  . Marital Status: Not on file   Past Surgical History:  Procedure Laterality Date  . KNEE SURGERY Left 2010   Past Surgical History:  Procedure Laterality Date  . KNEE SURGERY Left 2010   Past Medical History:  Diagnosis Date  . Disorders of sacrum   . Displacement of lumbar intervertebral disc without myelopathy   . Facet syndrome, lumbar   .  Lumbago   . Thoracic or lumbosacral neuritis or radiculitis, unspecified    BP (!) 151/87   Pulse 67   Temp 98.5 F (36.9 C)   Ht 5\' 8"  (1.727 m)   Wt 272 lb (123.4 kg)   SpO2 97%   BMI 41.36 kg/m   Opioid Risk Score:   Fall Risk Score:  `1  Depression screen PHQ 2/9  Depression screen [facility] 2/9 09/24/2019 07/16/2019 10/06/2018 12/09/2017 07/14/2017 04/09/2017 01/24/2016  Decreased Interest 0 0 0 0 0 0 0  Down, Depressed, Hopeless 0 0 0 0 0 0 0  PHQ - 2 Score 0 0 0 0 0 0 0  Altered sleeping - - - - - - -  Tired, decreased energy - - - - - - -  Change in appetite - - - - - - -  Feeling bad or failure about yourself  - - - - - - -  Trouble concentrating - - - - - - -  Moving slowly or fidgety/restless - - - - - - -  Suicidal thoughts - - - -  - - -  PHQ-9 Score - - - - - - -   Review of Systems  Musculoskeletal: Positive for back pain.  All other systems reviewed and are negative.      Objective:   Physical Exam Vitals and nursing note reviewed.  Constitutional:      Appearance: Normal appearance.  Cardiovascular:     Rate and Rhythm: Normal rate and regular rhythm.     Pulses: Normal pulses.     Heart sounds: Normal heart sounds.  Pulmonary:     Effort: Pulmonary effort is normal.     Breath sounds: Normal breath sounds.  Musculoskeletal:     Cervical back: Normal range of motion and neck supple.     Comments: Normal Muscle Bulk and Muscle Testing Reveals:  Upper Extremities: Full ROM and Muscle Strength 5/5 Lumbar Paraspinal Tenderness: L-4-L-5 Lower Extremities: Full ROM and Muscle Strength 5/5 Arises from Table with ease Narrow Based  Gait   Skin:    General: Skin is warm and dry.  Neurological:     Mental Status: He is alert and oriented to person, place, and time.  Psychiatric:        Mood and Affect: Mood normal.        Behavior: Behavior normal.           Assessment & Plan:  1. Chronic low back pain/ Lumbar Radiculopathy: Continue with HomeExercise Regime.11/26/2019 2. Degenerative disk disease lumbar spine:11/26/2019 Refilled: Morphine ( MS Contin)30mg  one tablet every 12 hours #60and Percocet 7.5/325 mg one tablet every 8 hours prn #90. Second script e-scribe for the following month.09/24/2019 We will continue the opioid monitoring program, this consists of regular clinic visits, examinations, urine drug screen, pill counts as well as use of 09/26/2019 Controlled Substance Reporting system. A 12 month History has been reviewed on the [place] Controlled Substance Reporting System on 11/26/2019. 3. Tobacco Abuse: Educated on smoking cessation: He verbalizes understanding.11/26/2019  20 minutes of face to face patient care time was spent during this visit. All questions were  encouraged and answered.   F/U in2Months

## 2019-11-29 ENCOUNTER — Telehealth: Payer: Self-pay | Admitting: *Deleted

## 2019-11-29 NOTE — Telephone Encounter (Signed)
Prior authorization morphine sulfate 30 mg ER APPROVED.  11/26/2019 - 11/24/2020

## 2020-01-21 ENCOUNTER — Encounter: Payer: Self-pay | Admitting: Registered Nurse

## 2020-01-21 ENCOUNTER — Encounter: Payer: 59 | Attending: Physical Medicine & Rehabilitation | Admitting: Registered Nurse

## 2020-01-21 ENCOUNTER — Other Ambulatory Visit: Payer: Self-pay

## 2020-01-21 VITALS — BP 147/88 | HR 72 | Temp 98.3°F | Ht 68.0 in | Wt 269.8 lb

## 2020-01-21 DIAGNOSIS — M5116 Intervertebral disc disorders with radiculopathy, lumbar region: Secondary | ICD-10-CM | POA: Insufficient documentation

## 2020-01-21 DIAGNOSIS — G894 Chronic pain syndrome: Secondary | ICD-10-CM | POA: Diagnosis not present

## 2020-01-21 DIAGNOSIS — Z79891 Long term (current) use of opiate analgesic: Secondary | ICD-10-CM | POA: Diagnosis not present

## 2020-01-21 DIAGNOSIS — Z5181 Encounter for therapeutic drug level monitoring: Secondary | ICD-10-CM

## 2020-01-21 MED ORDER — OXYCODONE-ACETAMINOPHEN 7.5-325 MG PO TABS: 1.0000 | ORAL_TABLET | Freq: Three times a day (TID) | ORAL | 0 refills | Status: DC | PRN

## 2020-01-21 MED ORDER — MORPHINE SULFATE ER 30 MG PO TBCR: 30.0000 mg | EXTENDED_RELEASE_TABLET | Freq: Two times a day (BID) | ORAL | 0 refills | Status: DC

## 2020-01-21 NOTE — Progress Notes (Signed)
Subjective:    Patient ID: Joshua [name], male    DOB: 1975-07-01, 44 y.o.   MRN: [id number]  HPI: Joshua [name] is a 44 y.o. male who returns for follow up appointment for chronic pain and medication refill. He states his pain is located in his lower back pain and occasionally radiating into his left lower extremity. He rates his pain 4. His current exercise regime is walking and performing stretching exercises.  Joshua [name] Morphine equivalent is 93.75  MME.  UDS ordered today.    Pain Inventory Average Pain 5 Pain Right Now 4 My pain is sharp, stabbing and aching  In the last 24 hours, has pain interfered with the following? General activity 4 Relation with others 3 Enjoyment of life 4 What TIME of day is your pain at its worst? daytime and evening Sleep (in general) Fair  Pain is worse with: walking, bending, sitting, standing and some activites Pain improves with: therapy/exercise and medication Relief from Meds: 5  Family History  Problem Relation Age of Onset  . Hypertension Mother   . Diabetes Father    Social History   Socioeconomic History  . Marital status: Single    Spouse name: Not on file  . Number of children: Not on file  . Years of education: Not on file  . Highest education level: Not on file  Occupational History  . Occupation: Biochemist, clinical  Tobacco Use  . Smoking status: Former Smoker    Packs/day: 1.00    Years: 25.00    Pack years: 25.00    Quit date: 10/23/2013    Years since quitting: 6.2  . Smokeless tobacco: Current User  . Tobacco comment: Currently uses Vapor cig daily  Vaping Use  . Vaping Use: Never used  Substance and Sexual Activity  . Alcohol use: No    Alcohol/week: 0.0 standard drinks  . Drug use: No  . Sexual activity: Yes    Birth control/protection: Condom  Other Topics Concern  . Not on file  Social History Narrative  . Not on file   Social Determinants of Health   Financial Resource Strain:   .  Difficulty of Paying Living Expenses: Not on file  Food Insecurity:   . Worried About Programme researcher, broadcasting/film/video in the Last Year: Not on file  . Ran Out of Food in the Last Year: Not on file  Transportation Needs:   . Lack of Transportation (Medical): Not on file  . Lack of Transportation (Non-Medical): Not on file  Physical Activity:   . Days of Exercise per Week: Not on file  . Minutes of Exercise per Session: Not on file  Stress:   . Feeling of Stress : Not on file  Social Connections:   . Frequency of Communication with Friends and Family: Not on file  . Frequency of Social Gatherings with Friends and Family: Not on file  . Attends Religious Services: Not on file  . Active Member of Clubs or Organizations: Not on file  . Attends Banker Meetings: Not on file  . Marital Status: Not on file   Past Surgical History:  Procedure Laterality Date  . KNEE SURGERY Left 2010   Past Surgical History:  Procedure Laterality Date  . KNEE SURGERY Left 2010   Past Medical History:  Diagnosis Date  . Disorders of sacrum   . Displacement of lumbar intervertebral disc without myelopathy   . Facet syndrome, lumbar   . Lumbago   .  Thoracic or lumbosacral neuritis or radiculitis, unspecified    BP (!) 147/88   Pulse 72   Temp 98.3 F (36.8 C)   Ht 5\' 8"  (1.727 m)   Wt 269 lb 12.8 oz (122.4 kg)   SpO2 97%   BMI 41.02 kg/m   Opioid Risk Score:   Fall Risk Score:  `1  Depression screen PHQ 2/9  Depression screen [facility] 2/9 01/21/2020 11/26/2019 09/24/2019 07/16/2019 10/06/2018 12/09/2017 07/14/2017  Decreased Interest 0 0 0 0 0 0 0  Down, Depressed, Hopeless 0 0 0 0 0 0 0  PHQ - 2 Score 0 0 0 0 0 0 0  Altered sleeping - - - - - - -  Tired, decreased energy - - - - - - -  Change in appetite - - - - - - -  Feeling bad or failure about yourself  - - - - - - -  Trouble concentrating - - - - - - -  Moving slowly or fidgety/restless - - - - - - -  Suicidal thoughts - - - - - - -  PHQ-9  Score - - - - - - -   Review of Systems  Constitutional: Negative.   HENT: Negative.   Eyes: Negative.   Respiratory: Negative.   Cardiovascular: Negative.   Gastrointestinal: Negative.   Endocrine: Negative.   Genitourinary: Negative.   Musculoskeletal: Positive for back pain.  Skin: Negative.   Allergic/Immunologic: Negative.   Neurological: Negative.   Hematological: Negative.   Psychiatric/Behavioral: Negative.   All other systems reviewed and are negative.      Objective:   Physical Exam Vitals and nursing note reviewed.  Constitutional:      Appearance: Normal appearance.  Cardiovascular:     Rate and Rhythm: Normal rate and regular rhythm.     Pulses: Normal pulses.     Heart sounds: Normal heart sounds.  Pulmonary:     Effort: Pulmonary effort is normal.     Breath sounds: Normal breath sounds.  Musculoskeletal:     Cervical back: Normal range of motion and neck supple.     Comments: Normal Muscle Bulk and Muscle Testing Reveals:  Upper Extremities: Full ROM and Muscle Strength 5/5  Lumbar Paraspinal Tenderness: L-3-L-5 Lower Extremities: Full ROM and Muscle Strength 5/5 Arises from chair with ease Narrow Based  Gait   Skin:    General: Skin is warm and dry.  Neurological:     Mental Status: He is alert and oriented to person, place, and time.  Psychiatric:        Mood and Affect: Mood normal.        Behavior: Behavior normal.           Assessment & Plan:  1. Chronic low back pain/ Lumbar Radiculopathy: Continue with HomeExercise Regime.01/21/2020 2. Degenerative disk disease lumbar spine:01/21/2020 Refilled: Morphine ( MS Contin)30mg  one tablet every 12 hours #60and Percocet 7.5/325 mg one tablet every 8 hours prn #90. Second script e-scribe for the following month.01/21/2020 We will continue the opioid monitoring program, this consists of regular clinic visits, examinations, urine drug screen, pill counts as well as use of 13/02/2020  Controlled Substance Reporting system. A 12 month History has been reviewed on the [place] Controlled Substance Reporting System on 01/21/2020. 3. Tobacco Abuse: Educated on smoking cessation: He verbalizes understanding.01/21/2020  13/02/2020 of face to face patient care time was spent during this visit. All questions were encouraged and answered.   F/U in2Months

## 2020-01-26 LAB — TOXASSURE SELECT,+ANTIDEPR,UR

## 2020-01-27 ENCOUNTER — Telehealth: Payer: Self-pay | Admitting: *Deleted

## 2020-01-27 NOTE — Telephone Encounter (Signed)
Urine drug screen for this encounter is consistent for prescribed medication 

## 2020-03-17 ENCOUNTER — Other Ambulatory Visit: Payer: Self-pay

## 2020-03-17 ENCOUNTER — Encounter: Payer: 59 | Attending: Physical Medicine & Rehabilitation | Admitting: Registered Nurse

## 2020-03-17 ENCOUNTER — Encounter: Payer: Self-pay | Admitting: Registered Nurse

## 2020-03-17 VITALS — BP 153/92 | HR 70 | Temp 98.1°F | Ht 68.0 in | Wt 270.2 lb

## 2020-03-17 DIAGNOSIS — G894 Chronic pain syndrome: Secondary | ICD-10-CM | POA: Diagnosis not present

## 2020-03-17 DIAGNOSIS — M5116 Intervertebral disc disorders with radiculopathy, lumbar region: Secondary | ICD-10-CM | POA: Diagnosis not present

## 2020-03-17 DIAGNOSIS — Z79891 Long term (current) use of opiate analgesic: Secondary | ICD-10-CM | POA: Diagnosis present

## 2020-03-17 DIAGNOSIS — Z5181 Encounter for therapeutic drug level monitoring: Secondary | ICD-10-CM | POA: Diagnosis not present

## 2020-03-17 DIAGNOSIS — M5416 Radiculopathy, lumbar region: Secondary | ICD-10-CM | POA: Diagnosis not present

## 2020-03-17 MED ORDER — OXYCODONE-ACETAMINOPHEN 7.5-325 MG PO TABS: 1.0000 | ORAL_TABLET | Freq: Three times a day (TID) | ORAL | 0 refills | Status: DC | PRN

## 2020-03-17 MED ORDER — MORPHINE SULFATE ER 30 MG PO TBCR: 30.0000 mg | EXTENDED_RELEASE_TABLET | Freq: Two times a day (BID) | ORAL | 0 refills | Status: DC

## 2020-03-17 NOTE — Progress Notes (Signed)
Subjective:    Patient ID: Joshua [name], male    DOB: 10-May-1975, 45 y.o.   MRN: [id number]  HPI: Joshua [name] is a 45 y.o. male who returns for follow up appointment for chronic pain and medication refill. He states his pain is located in his lower back radiating into his left lower extremity occasionally. He  rates his pain 5. His  current exercise regime is walking and performing stretching exercises.  Joshua [name] Morphine equivalent is 93.75 MME.   Last UDS was Performed on 01/21/2020.  Pain Inventory Average Pain 4 Pain Right Now 5 My pain is sharp and aching  In the last 24 hours, has pain interfered with the following? General activity 5 Relation with others 5 Enjoyment of life 4 What TIME of day is your pain at its worst? daytime and evening Sleep (in general) NA  Pain is worse with: walking, bending, sitting and standing Pain improves with: rest, therapy/exercise and medication Relief from Meds: 5  Family History  Problem Relation Age of Onset  . Hypertension Mother   . Diabetes Father    Social History   Socioeconomic History  . Marital status: Single    Spouse name: Not on file  . Number of children: Not on file  . Years of education: Not on file  . Highest education level: Not on file  Occupational History  . Occupation: Biochemist, clinical  Tobacco Use  . Smoking status: Former Smoker    Packs/day: 1.00    Years: 25.00    Pack years: 25.00    Quit date: 10/23/2013    Years since quitting: 6.4  . Smokeless tobacco: Current User  . Tobacco comment: Currently uses Vapor cig daily  Vaping Use  . Vaping Use: Never used  Substance and Sexual Activity  . Alcohol use: No    Alcohol/week: 0.0 standard drinks  . Drug use: No  . Sexual activity: Yes    Birth control/protection: Condom  Other Topics Concern  . Not on file  Social History Narrative  . Not on file   Social Determinants of Health   Financial Resource Strain: Not on file  Food  Insecurity: Not on file  Transportation Needs: Not on file  Physical Activity: Not on file  Stress: Not on file  Social Connections: Not on file   Past Surgical History:  Procedure Laterality Date  . KNEE SURGERY Left 2010   Past Surgical History:  Procedure Laterality Date  . KNEE SURGERY Left 2010   Past Medical History:  Diagnosis Date  . Disorders of sacrum   . Displacement of lumbar intervertebral disc without myelopathy   . Facet syndrome, lumbar   . Lumbago   . Thoracic or lumbosacral neuritis or radiculitis, unspecified    BP (!) 153/92   Pulse 70   Temp 98.1 F (36.7 C)   Ht 5\' 8"  (1.727 m)   Wt 270 lb 3.2 oz (122.6 kg)   SpO2 97%   BMI 41.08 kg/m   Opioid Risk Score:   Fall Risk Score:  `1  Depression screen PHQ 2/9  Depression screen [facility] 2/9 03/17/2020 01/21/2020 11/26/2019 09/24/2019 07/16/2019 10/06/2018 12/09/2017  Decreased Interest 0 0 0 0 0 0 0  Down, Depressed, Hopeless 0 0 0 0 0 0 0  PHQ - 2 Score 0 0 0 0 0 0 0  Altered sleeping - - - - - - -  Tired, decreased energy - - - - - - -  Change in appetite - - - - - - -  Feeling bad or failure about yourself  - - - - - - -  Trouble concentrating - - - - - - -  Moving slowly or fidgety/restless - - - - - - -  Suicidal thoughts - - - - - - -  PHQ-9 Score - - - - - - -    Review of Systems  Constitutional: Negative.   HENT: Negative.   Eyes: Negative.   Respiratory: Negative.   Cardiovascular: Negative.   Gastrointestinal: Negative.   Endocrine: Negative.   Genitourinary: Negative.   Musculoskeletal: Positive for back pain.  Skin: Negative.   Allergic/Immunologic: Negative.   Neurological: Negative.   Hematological: Negative.   Psychiatric/Behavioral: Negative.   All other systems reviewed and are negative.      Objective:   Physical Exam Vitals and nursing note reviewed.  Constitutional:      Appearance: Normal appearance.  Cardiovascular:     Rate and Rhythm: Normal rate and regular  rhythm.     Pulses: Normal pulses.     Heart sounds: Normal heart sounds.  Pulmonary:     Effort: Pulmonary effort is normal.     Breath sounds: Normal breath sounds.  Musculoskeletal:     Cervical back: Normal range of motion and neck supple.     Comments: Normal Muscle Bulk and Muscle Testing Reveals:  Upper Extremities: Full ROM and Muscle Strength 5/5  Lumbar Paraspinal Tenderness: L-4-L-5 Lower Extremities: Full ROM and Muscle Strength 5/5 Arises from chair with ease Narrow Based  Gait   Skin:    General: Skin is warm and dry.  Neurological:     Mental Status: He is alert and oriented to person, place, and time.  Psychiatric:        Mood and Affect: Mood normal.        Behavior: Behavior normal.           Assessment & Plan:  1. Chronic low back pain/ Lumbar Radiculopathy: Continue with HomeExercise Regime.03/17/2020 2. Degenerative disk disease lumbar spine:03/17/2020 Refilled: Morphine ( MS Contin)30mg  one tablet every 12 hours #60and Percocet 7.5/325 mg one tablet every 8 hours prn #90. Second script e-scribe for the following month.03/17/2020 We will continue the opioid monitoring program, this consists of regular clinic visits, examinations, urine drug screen, pill counts as well as use of [place] Controlled Substance Reporting system. A 12 month History has been reviewed on the [place] Controlled Substance Reporting Systemon 03/17/2020. 3. Tobacco Abuse: Educated on smoking cessation: He verbalizes understanding.03/17/2020   F/U in2Months

## 2020-05-19 ENCOUNTER — Encounter: Payer: 59 | Attending: Physical Medicine & Rehabilitation | Admitting: Registered Nurse

## 2020-05-19 ENCOUNTER — Other Ambulatory Visit: Payer: Self-pay

## 2020-05-19 ENCOUNTER — Encounter: Payer: Self-pay | Admitting: Registered Nurse

## 2020-05-19 VITALS — BP 152/89 | HR 76 | Temp 98.8°F | Ht 68.0 in | Wt 269.4 lb

## 2020-05-19 DIAGNOSIS — Z5181 Encounter for therapeutic drug level monitoring: Secondary | ICD-10-CM

## 2020-05-19 DIAGNOSIS — M5116 Intervertebral disc disorders with radiculopathy, lumbar region: Secondary | ICD-10-CM

## 2020-05-19 DIAGNOSIS — M5416 Radiculopathy, lumbar region: Secondary | ICD-10-CM | POA: Diagnosis present

## 2020-05-19 DIAGNOSIS — G894 Chronic pain syndrome: Secondary | ICD-10-CM

## 2020-05-19 DIAGNOSIS — Z79891 Long term (current) use of opiate analgesic: Secondary | ICD-10-CM

## 2020-05-19 MED ORDER — OXYCODONE-ACETAMINOPHEN 7.5-325 MG PO TABS: 1.0000 | ORAL_TABLET | Freq: Three times a day (TID) | ORAL | 0 refills | Status: DC | PRN

## 2020-05-19 MED ORDER — MORPHINE SULFATE ER 30 MG PO TBCR: 30.0000 mg | EXTENDED_RELEASE_TABLET | Freq: Two times a day (BID) | ORAL | 0 refills | Status: DC

## 2020-05-19 NOTE — Progress Notes (Signed)
Subjective:    Patient ID: Joshua [name], male    DOB: 20-Jan-1976, 45 y.o.   MRN: [id number]  HPI: Joshua [name] is a 45 y.o. male who returns for follow up appointment for chronic pain and medication refill. He states his  pain is located in his lower back and occasionally in his bilateral lower extremities. He rates his pain 5. His current exercise regime is walking and performing stretching exercises.  Mr. Duplantis Morphine equivalent is 93.75 MME.  UDS ordered today.   Pain Inventory Average Pain 5 Pain Right Now 5 My pain is sharp, stabbing and aching  In the last 24 hours, has pain interfered with the following? General activity 4 Relation with others 4 Enjoyment of life 4 What TIME of day is your pain at its worst? daytime and evening Sleep (in general) Fair  Pain is worse with: walking, bending, sitting, standing and some activites Pain improves with: rest, therapy/exercise and medication Relief from Meds: 5  Family History  Problem Relation Age of Onset  . Hypertension Mother   . Diabetes Father    Social History   Socioeconomic History  . Marital status: Single    Spouse name: Not on file  . Number of children: Not on file  . Years of education: Not on file  . Highest education level: Not on file  Occupational History  . Occupation: Biochemist, clinical  Tobacco Use  . Smoking status: Former Smoker    Packs/day: 1.00    Years: 25.00    Pack years: 25.00    Quit date: 10/23/2013    Years since quitting: 6.5  . Smokeless tobacco: Current User  . Tobacco comment: Currently uses Vapor cig daily  Vaping Use  . Vaping Use: Never used  Substance and Sexual Activity  . Alcohol use: No    Alcohol/week: 0.0 standard drinks  . Drug use: No  . Sexual activity: Yes    Birth control/protection: Condom  Other Topics Concern  . Not on file  Social History Narrative  . Not on file   Social Determinants of Health   Financial Resource Strain: Not on file   Food Insecurity: Not on file  Transportation Needs: Not on file  Physical Activity: Not on file  Stress: Not on file  Social Connections: Not on file   Past Surgical History:  Procedure Laterality Date  . KNEE SURGERY Left 2010   Past Surgical History:  Procedure Laterality Date  . KNEE SURGERY Left 2010   Past Medical History:  Diagnosis Date  . Disorders of sacrum   . Displacement of lumbar intervertebral disc without myelopathy   . Facet syndrome, lumbar   . Lumbago   . Thoracic or lumbosacral neuritis or radiculitis, unspecified    There were no vitals taken for this visit.  Opioid Risk Score:   Fall Risk Score:  `1  Depression screen PHQ 2/9  Depression screen [facility] 2/9 05/19/2020 03/17/2020 01/21/2020 11/26/2019 09/24/2019 07/16/2019 10/06/2018  Decreased Interest 0 0 0 0 0 0 0  Down, Depressed, Hopeless 0 0 0 0 0 0 0  PHQ - 2 Score 0 0 0 0 0 0 0  Altered sleeping - - - - - - -  Tired, decreased energy - - - - - - -  Change in appetite - - - - - - -  Feeling bad or failure about yourself  - - - - - - -  Trouble concentrating - - - - - - -  Moving slowly or fidgety/restless - - - - - - -  Suicidal thoughts - - - - - - -  PHQ-9 Score - - - - - - -    Review of Systems  Constitutional: Negative.   HENT: Negative.   Eyes: Negative.   Respiratory: Negative.   Cardiovascular: Negative.   Gastrointestinal: Negative.   Endocrine: Negative.   Genitourinary: Negative.   Musculoskeletal: Positive for back pain.  Skin: Negative.   Allergic/Immunologic: Negative.   Neurological: Negative.   Hematological: Negative.   Psychiatric/Behavioral: Negative.   All other systems reviewed and are negative.      Objective:   Physical Exam Vitals and nursing note reviewed.  Constitutional:      Appearance: Normal appearance.  Cardiovascular:     Rate and Rhythm: Normal rate and regular rhythm.     Pulses: Normal pulses.     Heart sounds: Normal heart sounds.  Pulmonary:      Effort: Pulmonary effort is normal.     Breath sounds: Normal breath sounds.  Musculoskeletal:     Cervical back: Normal range of motion and neck supple.     Comments: Normal Muscle Bulk and Muscle Testing Reveals:  Upper Extremities: Full ROM and Muscle Strength 5/5  Lumbar Paraspinal Tenderness: L-4-L-5 Lower Extremities: Full ROM and Muscle Strength 5/5  Arises from chair with ease  Narrow Based Gait   Skin:    General: Skin is warm and dry.  Neurological:     Mental Status: He is alert and oriented to person, place, and time.  Psychiatric:        Mood and Affect: Mood normal.        Behavior: Behavior normal.           Assessment & Plan:  1. Chronic low back pain/ Lumbar Radiculopathy: Continue with HomeExercise Regime.05/19/2020 2. Degenerative disk disease lumbar spine:05/19/2020 Refilled: Morphine ( MS Contin)30mg  one tablet every 12 hours #60and Percocet 7.5/325 mg one tablet every 8 hours prn #90. Second script e-scribe for the following month.05/19/2020 We will continue the opioid monitoring program, this consists of regular clinic visits, examinations, urine drug screen, pill counts as well as use of [place] Controlled Substance Reporting system. A 12 month History has been reviewed on the [place] Controlled Substance Reporting Systemon03/01/2021. 3. Tobacco Abuse: Educated on smoking cessation: He verbalizes understanding.05/19/2020   F/U in2Months

## 2020-05-26 ENCOUNTER — Telehealth: Payer: Self-pay | Admitting: *Deleted

## 2020-05-26 LAB — TOXASSURE SELECT,+ANTIDEPR,UR

## 2020-05-26 NOTE — Telephone Encounter (Signed)
Urine drug screen for this encounter is consistent for prescribed medication 

## 2020-07-07 ENCOUNTER — Telehealth: Payer: Self-pay

## 2020-07-07 NOTE — Telephone Encounter (Signed)
Usually the DOT has a form for [place] to fill out. He needs to bring the form so we can fill it out.

## 2020-07-07 NOTE — Telephone Encounter (Signed)
Joshua [name] has been advised. He said in the past a letter was created. I advised him to contact his HR, DOT or employer for the form.

## 2020-07-07 NOTE — Telephone Encounter (Signed)
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 He will need a letter stating what pain  medications he is taking. And it is okay to driver while taking the pain medication. He can pick the letter up on his next appointment, on 07/17/2020.

## 2020-07-14 ENCOUNTER — Ambulatory Visit: Payer: 59 | Admitting: Registered Nurse

## 2020-07-17 ENCOUNTER — Other Ambulatory Visit: Payer: Self-pay

## 2020-07-17 ENCOUNTER — Encounter: Payer: 59 | Attending: Physical Medicine & Rehabilitation | Admitting: Registered Nurse

## 2020-07-17 ENCOUNTER — Encounter: Payer: Self-pay | Admitting: Registered Nurse

## 2020-07-17 VITALS — BP 145/85 | HR 68 | Temp 97.9°F | Ht 68.0 in | Wt 271.8 lb

## 2020-07-17 DIAGNOSIS — G894 Chronic pain syndrome: Secondary | ICD-10-CM | POA: Diagnosis not present

## 2020-07-17 DIAGNOSIS — Z5181 Encounter for therapeutic drug level monitoring: Secondary | ICD-10-CM | POA: Insufficient documentation

## 2020-07-17 DIAGNOSIS — Z79891 Long term (current) use of opiate analgesic: Secondary | ICD-10-CM | POA: Diagnosis present

## 2020-07-17 DIAGNOSIS — M5116 Intervertebral disc disorders with radiculopathy, lumbar region: Secondary | ICD-10-CM | POA: Insufficient documentation

## 2020-07-17 MED ORDER — OXYCODONE-ACETAMINOPHEN 7.5-325 MG PO TABS: 1.0000 | ORAL_TABLET | Freq: Three times a day (TID) | ORAL | 0 refills | Status: DC | PRN

## 2020-07-17 MED ORDER — MORPHINE SULFATE ER 30 MG PO TBCR: 30.0000 mg | EXTENDED_RELEASE_TABLET | Freq: Two times a day (BID) | ORAL | 0 refills | Status: DC

## 2020-07-17 NOTE — Progress Notes (Signed)
Subjective:    Patient ID: Joshua [name], male    DOB: [birth year], 45 y.o.   MRN: [id number]  HPI: Joshua [name] is a 45 y.o. male who returns for follow up appointment for chronic pain and medication refill. He states his  pain is located in his lower back. He rates his pain 5. His  current exercise regime is walking and performing stretching exercises.  [name] Morphine equivalent is 87.75 MME.  Last UDS was Performed on 05/19/2020, it was consistent.  Pain Inventory Average Pain 5 Pain Right Now 5 My pain is sharp, stabbing and aching  In the last 24 hours, has pain interfered with the following? General activity 4 Relation with others 4 Enjoyment of life 4 What TIME of day is your pain at its worst? daytime and evening Sleep (in general) Fair  Pain is worse with: walking, bending, sitting, standing and some activites Pain improves with: rest, therapy/exercise and medication Relief from Meds: 5  Family History  Problem Relation Age of Onset  . Hypertension Mother   . Diabetes Father    Social History   Socioeconomic History  . Marital status: Single    Spouse name: Not on file  . Number of children: Not on file  . Years of education: Not on file  . Highest education level: Not on file  Occupational History  . Occupation: Biochemist, clinical  Tobacco Use  . Smoking status: Former Smoker    Packs/day: 1.00    Years: 25.00    Pack years: 25.00    Quit date: 10/23/2013    Years since quitting: 6.7  . Smokeless tobacco: Current User  . Tobacco comment: Currently uses Vapor cig daily  Vaping Use  . Vaping Use: Never used  Substance and Sexual Activity  . Alcohol use: No    Alcohol/week: 0.0 standard drinks  . Drug use: No  . Sexual activity: Yes    Birth control/protection: Condom  Other Topics Concern  . Not on file  Social History Narrative  . Not on file   Social Determinants of Health   Financial Resource Strain: Not on file  Food Insecurity:  Not on file  Transportation Needs: Not on file  Physical Activity: Not on file  Stress: Not on file  Social Connections: Not on file   Past Surgical History:  Procedure Laterality Date  . KNEE SURGERY Left 2010   Past Surgical History:  Procedure Laterality Date  . KNEE SURGERY Left 2010   Past Medical History:  Diagnosis Date  . Disorders of sacrum   . Displacement of lumbar intervertebral disc without myelopathy   . Facet syndrome, lumbar   . Lumbago   . Thoracic or lumbosacral neuritis or radiculitis, unspecified    BP (!) 145/85   Pulse 68   Temp 97.9 F (36.6 C)   Ht 5\' 8"  (1.727 m)   Wt 271 lb 12.8 oz (123.3 kg)   SpO2 98%   BMI 41.33 kg/m   Opioid Risk Score:   Fall Risk Score:  `1  Depression screen PHQ 2/9  Depression screen [facility] 2/9 07/17/2020 05/19/2020 05/19/2020 03/17/2020 01/21/2020 11/26/2019 09/24/2019  Decreased Interest 0 0 0 0 0 0 0  Down, Depressed, Hopeless 0 0 0 0 0 0 0  PHQ - 2 Score 0 0 0 0 0 0 0  Altered sleeping - - - - - - -  Tired, decreased energy - - - - - - -  Change in  appetite - - - - - - -  Feeling bad or failure about yourself  - - - - - - -  Trouble concentrating - - - - - - -  Moving slowly or fidgety/restless - - - - - - -  Suicidal thoughts - - - - - - -  PHQ-9 Score - - - - - - -     Review of Systems  Constitutional: Negative.   HENT: Negative.   Eyes: Negative.   Respiratory: Negative.   Cardiovascular: Negative.   Gastrointestinal: Negative.   Endocrine: Negative.   Genitourinary: Negative.   Musculoskeletal: Positive for back pain.  Skin: Negative.   Allergic/Immunologic: Negative.   Neurological: Negative.   Hematological: Negative.   Psychiatric/Behavioral: Negative.   All other systems reviewed and are negative.      Objective:   Physical Exam Vitals and nursing note reviewed.  Constitutional:      Appearance: Normal appearance.  Cardiovascular:     Rate and Rhythm: Normal rate and regular rhythm.      Pulses: Normal pulses.     Heart sounds: Normal heart sounds.  Pulmonary:     Effort: Pulmonary effort is normal.     Breath sounds: Normal breath sounds.  Musculoskeletal:     Cervical back: Normal range of motion and neck supple.     Comments: Normal Muscle Bulk and Muscle Testing Reveals:  Upper Extremities: Full ROM and Muscle Strength 5/5 Lumbar Paraspinal Tenderness: L-3-L-5 Lower Extremities: Full ROM and Muscle Strength 5/5 Arises from chair with ease Narrow Based  Gait   Skin:    General: Skin is warm and dry.  Neurological:     Mental Status: He is alert and oriented to person, place, and time.  Psychiatric:        Mood and Affect: Mood normal.        Behavior: Behavior normal.           Assessment & Plan:  1. Chronic low back pain/ Lumbar Radiculopathy: Continue with HomeExercise Regime.07/17/2020 2. Degenerative disk disease lumbar spine:07/17/2020 Refilled: Morphine ( MS Contin)30mg  one tablet every 12 hours #60and Percocet 7.5/325 mg one tablet every 8 hours prn #90. Second script e-scribe for the following month.07/17/2020 We will continue the opioid monitoring program, this consists of regular clinic visits, examinations, urine drug screen, pill counts as well as use of [place] Controlled Substance Reporting system. A 12 month History has been reviewed on the [place] Controlled Substance Reporting Systemon05/11/2020. 3. Tobacco Abuse: Educated on smoking cessation: He verbalizes understanding.07/17/2020   F/U in2Months

## 2020-09-15 ENCOUNTER — Encounter: Payer: 59 | Attending: Physical Medicine & Rehabilitation | Admitting: Registered Nurse

## 2020-09-15 ENCOUNTER — Encounter: Payer: Self-pay | Admitting: Registered Nurse

## 2020-09-15 ENCOUNTER — Other Ambulatory Visit: Payer: Self-pay

## 2020-09-15 VITALS — BP 138/83 | HR 68 | Temp 98.0°F | Ht 68.0 in | Wt 271.2 lb

## 2020-09-15 DIAGNOSIS — G894 Chronic pain syndrome: Secondary | ICD-10-CM | POA: Insufficient documentation

## 2020-09-15 DIAGNOSIS — Z79899 Other long term (current) drug therapy: Secondary | ICD-10-CM | POA: Insufficient documentation

## 2020-09-15 DIAGNOSIS — M5116 Intervertebral disc disorders with radiculopathy, lumbar region: Secondary | ICD-10-CM | POA: Diagnosis present

## 2020-09-15 DIAGNOSIS — M5416 Radiculopathy, lumbar region: Secondary | ICD-10-CM | POA: Insufficient documentation

## 2020-09-15 DIAGNOSIS — Z5181 Encounter for therapeutic drug level monitoring: Secondary | ICD-10-CM | POA: Diagnosis present

## 2020-09-15 MED ORDER — OXYCODONE-ACETAMINOPHEN 7.5-325 MG PO TABS: 1.0000 | ORAL_TABLET | Freq: Three times a day (TID) | ORAL | 0 refills | Status: DC | PRN

## 2020-09-15 MED ORDER — MORPHINE SULFATE ER 30 MG PO TBCR: 30.0000 mg | EXTENDED_RELEASE_TABLET | Freq: Two times a day (BID) | ORAL | 0 refills | Status: DC

## 2020-09-15 NOTE — Progress Notes (Signed)
Subjective:    Patient ID: Joshua [name], male    DOB: August 18, 1975, 45 y.o.   MRN: [id number]  HPI: Joshua [name] is a 45 y.o. male who returns for follow up appointment for chronic pain and medication refill. He states his pain is located in his lower back and occassionally radiates into his left lower extremity with long periods of standing. He rates his pain 5. His current exercise regime is walking and performing stretching exercises.   Joshua [name] Morphine equivalent is 03.75 MME.   UDS was Performed today.   Pain Inventory Average Pain 4 Pain Right Now 5 My pain is sharp, stabbing, and aching  In the last 24 hours, has pain interfered with the following? General activity 5 Relation with others 4 Enjoyment of life 5 What TIME of day is your pain at its worst? 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  Tobacco Use   Smoking status: Former    Packs/day: 1.00    Years: 25.00    Pack years: 25.00    Types: Cigarettes    Quit date: 10/23/2013    Years since quitting: 6.9   Smokeless tobacco: Current   Tobacco comments:    Currently uses Vapor cig daily  Vaping Use   Vaping Use: Never used  Substance and Sexual Activity   Alcohol use: No    Alcohol/week: 0.0 standard drinks   Drug use: No   Sexual activity: Yes    Birth control/protection: Condom  Other Topics Concern   Not on file  Social History Narrative   Not on file   Social Determinants of Health   Financial  Resource Strain: Not on file  Food Insecurity: Not on file  Transportation Needs: Not on file  Physical Activity: Not on file  Stress: Not on file  Social Connections: Not on file   Past Surgical History:  Procedure Laterality Date   KNEE SURGERY Left 2010   Past Surgical History:  Procedure Laterality Date   KNEE SURGERY Left 2010   Past Medical History:  Diagnosis Date   Disorders of sacrum    Displacement of lumbar intervertebral disc without myelopathy    Facet syndrome, lumbar    Lumbago    Thoracic or lumbosacral neuritis or radiculitis, unspecified    BP 138/83   Pulse 68   Temp 98 F (36.7 C)   Ht 5\' 8"  (1.727 m)   Wt 271 lb 3.2 oz (123 kg)   SpO2 96%   BMI 41.24 kg/m   Opioid Risk Score:   Fall Risk Score:  `1  Depression screen PHQ 2/9  Depression screen [facility] 2/9 09/15/2020 07/17/2020 05/19/2020 05/19/2020 03/17/2020 01/21/2020 11/26/2019  Decreased Interest 0 0 0 0 0 0 0  Down, Depressed, Hopeless 0 0 0 0 0 0 0  PHQ - 2 Score 0 0 0 0 0 0 0  Altered sleeping - - - - - - -  Tired,  decreased energy - - - - - - -  Change in appetite - - - - - - -  Feeling bad or failure about yourself  - - - - - - -  Trouble concentrating - - - - - - -  Moving slowly or fidgety/restless - - - - - - -  Suicidal thoughts - - - - - - -  PHQ-9 Score - - - - - - -     Review of Systems  Constitutional: Negative.   HENT: Negative.    Eyes: Negative.   Respiratory: Negative.    Cardiovascular: Negative.   Gastrointestinal: Negative.   Endocrine: Negative.   Genitourinary: Negative.   Musculoskeletal:  Positive for back pain.  Skin: Negative.   Allergic/Immunologic: Negative.   Neurological: Negative.   Hematological: Negative.   Psychiatric/Behavioral: Negative.    All other systems reviewed and are negative.     Objective:   Physical Exam Vitals and nursing note reviewed.  Constitutional:      Appearance: Normal appearance.  Cardiovascular:     Rate and Rhythm:  Normal rate and regular rhythm.     Pulses: Normal pulses.     Heart sounds: Normal heart sounds.  Pulmonary:     Effort: Pulmonary effort is normal.     Breath sounds: Normal breath sounds.  Musculoskeletal:     Cervical back: Normal range of motion and neck supple.     Comments: Normal Muscle Bulk and Muscle Testing Reveals:  Upper Extremities: Full ROM and Muscle Strength 5/5  Lumbar Paraspinal Tenderness: L-3-L-5 Lower Extremities: Full ROM and Muscle Strength 5/5 Arises from Table with ease Narrow Based  Gait     Skin:    General: Skin is warm and dry.  Neurological:     Mental Status: He is alert and oriented to person, place, and time.  Psychiatric:        Mood and Affect: Mood normal.        Behavior: Behavior normal.         Assessment & Plan:  1. Chronic low back pain/ Lumbar Radiculopathy: Continue with Home Exercise Regime. 09/15/2020 2. Degenerative disk disease lumbar spine: 09/15/2020 Refilled: Morphine ( MS Contin) 30 mg one  tablet every 12 hours #60 and Percocet 7.5/325 mg one tablet every 8 hours prn #90. Second script e-scribe for the following month. 09/15/2020 We will continue the opioid monitoring program, this consists of regular clinic visits, examinations, urine drug screen, pill counts as well as use of [place] Controlled Substance Reporting system. A 12 month History has been reviewed on the [place] Controlled Substance Reporting System on 09/15/2020. 3. Tobacco Abuse: Educated on smoking cessation: He verbalizes understanding. 09/15/2020     F/U in 2 Months

## 2020-09-21 LAB — TOXASSURE SELECT,+ANTIDEPR,UR

## 2020-09-29 ENCOUNTER — Telehealth: Payer: Self-pay | Admitting: *Deleted

## 2020-09-29 NOTE — Telephone Encounter (Signed)
Urine drug screen for this encounter is consistent for prescribed medication 

## 2020-10-23 ENCOUNTER — Ambulatory Visit: Payer: 59 | Admitting: Family Medicine

## 2020-10-23 ENCOUNTER — Encounter: Payer: Self-pay | Admitting: Family Medicine

## 2020-10-23 ENCOUNTER — Other Ambulatory Visit: Payer: Self-pay

## 2020-10-23 VITALS — BP 133/76 | HR 68 | Temp 98.8°F | Ht 68.0 in | Wt 268.4 lb

## 2020-10-23 DIAGNOSIS — Z0000 Encounter for general adult medical examination without abnormal findings: Secondary | ICD-10-CM

## 2020-10-23 DIAGNOSIS — M5116 Intervertebral disc disorders with radiculopathy, lumbar region: Secondary | ICD-10-CM

## 2020-10-23 DIAGNOSIS — G4733 Obstructive sleep apnea (adult) (pediatric): Secondary | ICD-10-CM | POA: Diagnosis not present

## 2020-10-23 DIAGNOSIS — Z0001 Encounter for general adult medical examination with abnormal findings: Secondary | ICD-10-CM | POA: Diagnosis not present

## 2020-10-23 DIAGNOSIS — Z720 Tobacco use: Secondary | ICD-10-CM

## 2020-10-23 LAB — URINALYSIS
Bilirubin, UA: NEGATIVE
Glucose, UA: NEGATIVE
Ketones, UA: NEGATIVE
Leukocytes,UA: NEGATIVE
Nitrite, UA: NEGATIVE
Protein,UA: NEGATIVE
Specific Gravity, UA: >1.03 — ABNORMAL HIGH (ref 1.005–1.030)
Urobilinogen, Ur: 0.2 mg/dL (ref 0.2–1.0)
pH, UA: 5 (ref 5.0–7.5)

## 2020-10-23 NOTE — Patient Instructions (Signed)
Check blood pressure as follows: For the next 7-10 days check in the morning and afternoon or evening. Check when well rested and at low stress level. Check three times back-to-back each time and write all of the numbers down.   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 For good health the BP should be under 130/80.

## 2020-10-23 NOTE — Progress Notes (Signed)
Subjective:  Patient ID: Joshua [name], male    DOB: 12-23-1975  Age: 45 y.o. MRN: [id number]  CC: Establish Care   HPI Joshua [name] presents for new patient evaluation.  He drives truck for living.  He has given a 36-monthcard temporarily in order to get his blood pressure under control.  It was elevated at his recent DOT evaluation at a different practice.  He is also followed by Dr. ZTawni Levyfor lower back pain.  He has not had surgery before.  He has chronic lumbar pain.  He also has obstructive sleep apnea.  [facility] 10/23/2020 09/15/2020 07/17/2020  Decreased Interest 0 0 0  Down, Depressed, Hopeless 0 0 0  PHQ - 2 Score 0 0 0  Altered sleeping - - -  Tired, decreased energy - - -  Change in appetite - - -  Feeling bad or failure about yourself  - - -  Trouble concentrating - - -  Moving slowly or fidgety/restless - - -  Suicidal thoughts - - -  PHQ-9 Score - - -    History Joshua [name] has a past medical history of Disorders of sacrum, Displacement of lumbar intervertebral disc without myelopathy, Facet syndrome, lumbar, Lumbago, and Thoracic or lumbosacral neuritis or radiculitis, unspecified.   He has a past surgical history that includes Knee surgery (Left, 2010).   His family history includes Diabetes in his father; Hypertension in his mother.He reports that he quit smoking about 7 years ago. His smoking use included cigarettes. He has a 25.00 pack-year smoking history. He uses smokeless tobacco. He reports that he does not drink alcohol and does not use drugs.    ROS Review of Systems  Constitutional:  Negative for activity change, fatigue and unexpected weight change.  HENT:  Negative for congestion, ear pain, hearing loss, postnasal drip and trouble swallowing.   Eyes:  Negative for pain and visual disturbance.  Respiratory:  Negative for cough, chest tightness and shortness of breath.   Cardiovascular:  Negative for chest pain, palpitations  and leg swelling.  Gastrointestinal:  Negative for abdominal distention, abdominal pain, blood in stool, constipation, diarrhea, nausea and vomiting.  Endocrine: Negative for cold intolerance, heat intolerance and polydipsia.  Genitourinary:  Negative for difficulty urinating, dysuria, flank pain, frequency and urgency.  Musculoskeletal:  Positive for back pain. Negative for arthralgias and joint swelling.  Skin:  Negative for color change, rash and wound.  Neurological:  Negative for dizziness, syncope, speech difficulty, weakness, light-headedness, numbness and headaches.  Hematological:  Does not bruise/bleed easily.  Psychiatric/Behavioral:  Negative for confusion, decreased concentration, dysphoric mood and sleep disturbance. The patient is not nervous/anxious.    Objective:  BP 133/76   Pulse 68   Temp 98.8 F (37.1 C)   Ht _0  (1.727 m)   Wt 268 lb 6.4 oz (121.7 kg)   SpO2 96%   BMI 40.81 kg/m   BP Readings from Last 3 Encounters:  10/23/20 133/76  09/15/20 138/83  07/17/20 (!) 145/85    Wt Readings from Last 3 Encounters:  10/23/20 268 lb 6.4 oz (121.7 kg)  09/15/20 271 lb 3.2 oz (123 kg)  07/17/20 271 lb 12.8 oz (123.3 kg)     Physical Exam Constitutional:      Appearance: He is well-developed.  HENT:     Head: Normocephalic and atraumatic.  Eyes:     Pupils: Pupils are equal, round, and reactive to light.  Neck:  Thyroid: No thyromegaly.     Trachea: No tracheal deviation.  Cardiovascular:     Rate and Rhythm: Normal rate and regular rhythm.     Heart sounds: Normal heart sounds. No murmur heard.   No friction rub. No gallop.  Pulmonary:     Breath sounds: Normal breath sounds. No wheezing or rales.  Abdominal:     General: Bowel sounds are normal. There is no distension.     Palpations: Abdomen is soft. There is no mass.     Tenderness: There is no abdominal tenderness.     Hernia: There is no hernia in the left inguinal area.  Genitourinary:     Penis: Normal.      Testes: Normal.  Musculoskeletal:        General: Normal range of motion.     Cervical back: Normal range of motion.  Lymphadenopathy:     Cervical: No cervical adenopathy.  Skin:    General: Skin is warm and dry.  Neurological:     Mental Status: He is alert and oriented to person, place, and time.      Assessment & Plan:   Joshua [name] was seen today for establish care.  Diagnoses and all orders for this visit:  Well adult exam -     CBC with Differential/Platelet -     CMP14+EGFR -     Lipid panel -     PSA, total and free -     Urinalysis  Morbid obesity (HCC) -     CBC with Differential/Platelet -     CMP14+EGFR -     Lipid panel -     PSA, total and free -     Urinalysis  Lumbar disc disease with radiculopathy -     CBC with Differential/Platelet -     CMP14+EGFR -     Lipid panel -     PSA, total and free -     Urinalysis  Obstructive sleep apnea -     CBC with Differential/Platelet -     CMP14+EGFR -     Lipid panel -     PSA, total and free -     Urinalysis  Tobacco abuse -     CBC with Differential/Platelet -     CMP14+EGFR -     Lipid panel -     PSA, total and free -     Urinalysis      I am having Joshua [name] maintain his morphine and oxyCODONE-acetaminophen.  Allergies as of 10/23/2020   No Known Allergies      Medication List        Accurate as of October 23, 2020  6:08 PM. If you have any questions, ask your nurse or doctor.          morphine 30 MG 12 hr tablet Commonly known as: MS CONTIN Take 1 tablet (30 mg total) by mouth every 12 (twelve) hours.   oxyCODONE-acetaminophen 7.5-325 MG tablet Commonly known as: Percocet Take 1 tablet by mouth every 8 (eight) hours as needed.       Patient's blood pressure was not elevated on evaluation here.  He does not need to start medication based on the data we have available.  However to ensure that he is treated adequately to pass his follow-up with his DOT  evaluation, I have asked that he check his blood pressure in the morning and in the afternoon daily for a week.  He should forward those readings  to me.  Asked that he do 3 readings each time to ensure position and and accuracy  Follow-up: Return in about 1 month (around 11/23/2020).  Claretta Fraise, M.D.

## 2020-10-24 LAB — CBC WITH DIFFERENTIAL/PLATELET
Basophils Absolute: 0.1 10*3/uL (ref 0.0–0.2)
Basos: 1 %
EOS (ABSOLUTE): 0.1 10*3/uL (ref 0.0–0.4)
Eos: 1 %
Hematocrit: 47.7 % (ref 37.5–51.0)
Hemoglobin: 16 g/dL (ref 13.0–17.7)
Immature Grans (Abs): 0 10*3/uL (ref 0.0–0.1)
Immature Granulocytes: 0 %
Lymphocytes Absolute: 3.7 10*3/uL — ABNORMAL HIGH (ref 0.7–3.1)
Lymphs: 31 %
MCH: 28.8 pg (ref 26.6–33.0)
MCHC: 33.5 g/dL (ref 31.5–35.7)
MCV: 86 fL (ref 79–97)
Monocytes Absolute: 0.9 10*3/uL (ref 0.1–0.9)
Monocytes: 8 %
Neutrophils Absolute: 7 10*3/uL (ref 1.4–7.0)
Neutrophils: 59 %
Platelets: 249 10*3/uL (ref 150–450)
RBC: 5.56 x10E6/uL (ref 4.14–5.80)
RDW: 12.2 % (ref 11.6–15.4)
WBC: 11.9 10*3/uL — ABNORMAL HIGH (ref 3.4–10.8)

## 2020-10-24 LAB — CMP14+EGFR
ALT: 16 IU/L (ref 0–44)
AST: 16 IU/L (ref 0–40)
Albumin/Globulin Ratio: 1.8 (ref 1.2–2.2)
Albumin: 4.4 g/dL (ref 4.0–5.0)
Alkaline Phosphatase: 75 IU/L (ref 44–121)
BUN/Creatinine Ratio: 20 (ref 9–20)
BUN: 17 mg/dL (ref 6–24)
Bilirubin Total: 0.4 mg/dL (ref 0.0–1.2)
CO2: 25 mmol/L (ref 20–29)
Calcium: 9.9 mg/dL (ref 8.7–10.2)
Chloride: 102 mmol/L (ref 96–106)
Creatinine, Ser: 0.84 mg/dL (ref 0.76–1.27)
Globulin, Total: 2.4 g/dL (ref 1.5–4.5)
Glucose: 144 mg/dL — ABNORMAL HIGH (ref 65–99)
Potassium: 4.5 mmol/L (ref 3.5–5.2)
Sodium: 138 mmol/L (ref 134–144)
Total Protein: 6.8 g/dL (ref 6.0–8.5)
eGFR: 111 mL/min/{1.73_m2} (ref >59)

## 2020-10-24 LAB — PSA, TOTAL AND FREE
PSA, Free Pct: 26.3 %
PSA, Free: 0.21 ng/mL
Prostate Specific Ag, Serum: 0.8 ng/mL (ref 0.0–4.0)

## 2020-10-24 LAB — LIPID PANEL
Chol/HDL Ratio: 5.5 ratio — ABNORMAL HIGH (ref 0.0–5.0)
Cholesterol, Total: 203 mg/dL — ABNORMAL HIGH (ref 100–199)
HDL: 37 mg/dL — ABNORMAL LOW (ref >39)
LDL Chol Calc (NIH): 132 mg/dL — ABNORMAL HIGH (ref 0–99)
Triglycerides: 191 mg/dL — ABNORMAL HIGH (ref 0–149)
VLDL Cholesterol Cal: 34 mg/dL (ref 5–40)

## 2020-10-30 LAB — HGB A1C W/O EAG: Hgb A1c MFr Bld: 6.5 % — ABNORMAL HIGH (ref 4.8–5.6)

## 2020-10-30 LAB — SPECIMEN STATUS REPORT

## 2020-11-07 ENCOUNTER — Other Ambulatory Visit: Payer: Self-pay

## 2020-11-07 ENCOUNTER — Encounter: Payer: Self-pay | Admitting: Pulmonary Disease

## 2020-11-07 ENCOUNTER — Ambulatory Visit (INDEPENDENT_AMBULATORY_CARE_PROVIDER_SITE_OTHER): Payer: 59 | Admitting: Pulmonary Disease

## 2020-11-07 VITALS — BP 128/84 | HR 69 | Temp 97.3°F | Ht 68.0 in | Wt 266.2 lb

## 2020-11-07 DIAGNOSIS — G4733 Obstructive sleep apnea (adult) (pediatric): Secondary | ICD-10-CM

## 2020-11-07 NOTE — Patient Instructions (Signed)
I will see you yearly  Will contact [facility] to see if they can get you a smart card for your machine  Continue using CPAP on a nightly basis  Weight loss efforts  Smoking cessation efforts  Call with significant concerns

## 2020-11-07 NOTE — Progress Notes (Signed)
Joshua [name]    [id number]    1975-12-25  Primary Care Physician:[name], [name], MD  Referring Physician: Junie Spencer, FNP [address] [place],  [place] [address]  Chief complaint:   Patient is being seen for obstructive sleep apnea  HPI:  Diagnosed with obstructive sleep apnea in 2016 Has been on CPAP on a regular basis CPAP pressure of 12  Uses CPAP nightly Compliance data not available Does not have a smart card  Usually goes to bed between 9 and 10, takes him about 50 minutes to fall asleep Wakes up once or twice Final wake up time about 5:55 AM Feels well rested in the morning He is not sleepy during the day  His recent physical had elevated blood pressure, mildly elevated sugars  Half a pack a day smoker  He feels well otherwise  Outpatient Encounter Medications as of 11/07/2020  Medication Sig   morphine (MS CONTIN) 30 MG 12 hr tablet Take 1 tablet (30 mg total) by mouth every 12 (twelve) hours.   oxyCODONE-acetaminophen (PERCOCET) 7.5-325 MG tablet Take 1 tablet by mouth every 8 (eight) hours as needed.   No facility-administered encounter medications on file as of 11/07/2020.    Allergies as of 11/07/2020   (No Known Allergies)    Past Medical History:  Diagnosis Date   Disorders of sacrum    Displacement of lumbar intervertebral disc without myelopathy    Facet syndrome, lumbar    Lumbago    Thoracic or lumbosacral neuritis or radiculitis, unspecified     Past Surgical History:  Procedure Laterality Date   KNEE SURGERY Left 2010    Family History  Problem Relation Age of Onset   Hypertension Mother    Diabetes Father     Social History   Socioeconomic History   Marital status: Single    Spouse name: Not on file   Number of children: Not on file   Years of education: Not on file   Highest education level: Not on file  Occupational History   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  Tobacco Use   Smoking status: Every  Day    Packs/day: 1.00    Years: 25.00    Pack years: 25.00    Types: Cigarettes    Last attempt to quit: 10/23/2013    Years since quitting: 7.0   Smokeless tobacco: Former   Tobacco comments:    .5ppd cigarettes-11/07/20  Vaping Use   Vaping Use: Never used  Substance and Sexual Activity   Alcohol use: No    Alcohol/week: 0.0 standard drinks   Drug use: No   Sexual activity: Yes    Birth control/protection: Condom  Other Topics Concern   Not on file  Social History Narrative   Not on file   Social Determinants of Health   Financial Resource Strain: Not on file  Food Insecurity: Not on file  Transportation Needs: Not on file  Physical Activity: Not on file  Stress: Not on file  Social Connections: Not on file  Intimate Partner Violence: Not on file    Review of Systems  Constitutional:  Negative for fatigue.  Respiratory:  Positive for apnea.    Vitals:   11/07/20 1140  BP: 128/84  Pulse: 69  Temp: (!) 97.3 F (36.3 C)  SpO2: 98%     Physical Exam Constitutional:      Appearance: He is obese.  HENT:     Head: Normocephalic.  Nose: No congestion or rhinorrhea.     Mouth/Throat:     Mouth: Mucous membranes are moist.     Comments: Mallampati 3, macroglossia Eyes:     Pupils: Pupils are equal, round, and reactive to light.  Cardiovascular:     Rate and Rhythm: Normal rate and regular rhythm.     Heart sounds: No murmur heard.   No friction rub.  Pulmonary:     Effort: Pulmonary effort is normal. No respiratory distress.     Breath sounds: No stridor. No wheezing or rhonchi.  Musculoskeletal:     Cervical back: No rigidity or tenderness.  Neurological:     Mental Status: He is alert.  Psychiatric:        Mood and Affect: Mood normal.   Data Reviewed: Sleep study from 2016 revealed severe obstructive sleep apnea with AHI of 73 Titrated to CPAP of 12  Assessment:  Severe obstructive sleep apnea  Denies daytime sleepiness  Good compliance  with CPAP, benefiting from CPAP use  Active smoker  Plan/Recommendations: Continue CPAP on a nightly basis  Weight loss efforts encouraged  Will contact medical supply company to see whether he can be provided with a new smart card to allow compliance monitoring  Smoking cessation counseling  Encouraged to call with any significant concerns  As he is not having any significant concerns with his obstructive sleep apnea and is compliant with treatment We will follow him up in about a year   Virl Diamond MD [facility] 11/07/2020, 12:21 PM  CC: Junie Spencer, FNP

## 2020-11-09 ENCOUNTER — Ambulatory Visit: Payer: 59 | Admitting: Family Medicine

## 2020-11-17 ENCOUNTER — Encounter: Payer: 59 | Attending: Physical Medicine & Rehabilitation | Admitting: Registered Nurse

## 2020-11-17 ENCOUNTER — Other Ambulatory Visit: Payer: Self-pay

## 2020-11-17 ENCOUNTER — Encounter: Payer: Self-pay | Admitting: Registered Nurse

## 2020-11-17 DIAGNOSIS — G894 Chronic pain syndrome: Secondary | ICD-10-CM | POA: Diagnosis present

## 2020-11-17 DIAGNOSIS — M5416 Radiculopathy, lumbar region: Secondary | ICD-10-CM | POA: Insufficient documentation

## 2020-11-17 DIAGNOSIS — M5116 Intervertebral disc disorders with radiculopathy, lumbar region: Secondary | ICD-10-CM | POA: Diagnosis present

## 2020-11-17 DIAGNOSIS — Z5181 Encounter for therapeutic drug level monitoring: Secondary | ICD-10-CM | POA: Insufficient documentation

## 2020-11-17 DIAGNOSIS — Z79899 Other long term (current) drug therapy: Secondary | ICD-10-CM | POA: Diagnosis present

## 2020-11-17 MED ORDER — OXYCODONE-ACETAMINOPHEN 7.5-325 MG PO TABS: 1.0000 | ORAL_TABLET | Freq: Three times a day (TID) | ORAL | 0 refills | Status: DC | PRN

## 2020-11-17 MED ORDER — MORPHINE SULFATE ER 30 MG PO TBCR: 30.0000 mg | EXTENDED_RELEASE_TABLET | Freq: Two times a day (BID) | ORAL | 0 refills | Status: DC

## 2020-11-17 NOTE — Progress Notes (Signed)
Subjective:    Patient ID: Joshua [name], male    DOB: Jul 09, 1975, 45 y.o.   MRN: [id number]  HPI: Joshua [name] is a 45 y.o. male who returns for follow up appointment for chronic pain and medication refill. He states his pain is located in his lower back and radiates into his right lower extremity occasionally. He rates his pain 5. His current exercise regime is walking and performing stretching exercises.  Joshua [name] Morphine equivalent is 93.75 MME.    Last UDS was Performed on 09/15/2020, it was consistent.   Vitals: 122/69 P: 65 Sat's: 98%  Pain Inventory Average Pain 4 Pain Right Now 5 My pain is sharp, stabbing, and aching  In the last 24 hours, has pain interfered with the following? General activity 4 Relation with others 5 Enjoyment of life 4 What TIME of day is your pain at its worst? 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  Tobacco Use   Smoking status: Every Day    Packs/day: 1.00    Years: 25.00    Pack years: 25.00    Types: Cigarettes    Last attempt to quit: 10/23/2013    Years since quitting: 7.0   Smokeless tobacco: Former   Tobacco comments:    .5ppd cigarettes-11/07/20  Vaping Use   Vaping Use: Never used  Substance and Sexual Activity   Alcohol use: No    Alcohol/week: 0.0 standard drinks   Drug use: No   Sexual activity: Yes    Birth control/protection: Condom  Other Topics Concern   Not on file  Social History Narrative    Not on file   Social Determinants of Health   Financial Resource Strain: Not on file  Food Insecurity: Not on file  Transportation Needs: Not on file  Physical Activity: Not on file  Stress: Not on file  Social Connections: Not on file   Past Surgical History:  Procedure Laterality Date   KNEE SURGERY Left 2010   Past Medical History:  Diagnosis Date   Disorders of sacrum    Displacement of lumbar intervertebral disc without myelopathy    Facet syndrome, lumbar    Lumbago    Thoracic or lumbosacral neuritis or radiculitis, unspecified    There were no vitals taken for this visit.  Opioid Risk Score:   Fall Risk Score:  `1  Depression screen PHQ 2/9  Depression screen [facility] 2/9 11/17/2020 10/23/2020 09/15/2020 07/17/2020 05/19/2020 05/19/2020 03/17/2020  Decreased Interest 0 0 0 0 0 0 0  Down, Depressed, Hopeless 0 0 0 0 0 0 0  PHQ - 2 Score 0 0 0 0 0 0 0  Altered sleeping - - - - - - -  Tired, decreased energy - - - - - - -  Change in appetite - - - - - - -  Feeling bad or failure about yourself  - - - - - - -  Trouble concentrating - - - - - - -  Moving slowly or fidgety/restless - - - - - - -  Suicidal thoughts - - - - - - -  PHQ-9 Score - - - - - - -     Review of Systems  Constitutional: Negative.   HENT: Negative.    Eyes: Negative.   Respiratory: Negative.    Cardiovascular: Negative.   Gastrointestinal: Negative.   Endocrine: Negative.   Genitourinary: Negative.   Musculoskeletal:  Positive for back pain.  Skin: Negative.   Allergic/Immunologic: Negative.   Neurological: Negative.   Hematological: Negative.   Psychiatric/Behavioral: Negative.    All other systems reviewed and are negative.     Objective:   Physical Exam Vitals and nursing note reviewed.  Constitutional:      Appearance: Normal appearance.  Cardiovascular:     Rate and Rhythm: Normal rate and regular rhythm.     Pulses: Normal pulses.     Heart sounds: Normal heart sounds.  Pulmonary:      Effort: Pulmonary effort is normal.     Breath sounds: Normal breath sounds.  Musculoskeletal:     Cervical back: Normal range of motion and neck supple.     Comments: Normal Muscle Bulk and Muscle Testing Reveals:  Upper Extremities: Full ROM and Muscle Strength 5/5 Lumbar Paraspinal Tenderness: L-3-L-5 Lower Extremities: Full ROM and Muscle Strength 5/5 Arises from chair with ease Narrow Based  Gait     Skin:    General: Skin is warm and dry.  Neurological:     Mental Status: He is alert and oriented to person, place, and time.  Psychiatric:        Mood and Affect: Mood normal.        Behavior: Behavior normal.         Assessment & Plan:  1. Chronic low back pain/ Lumbar Radiculopathy: Continue with Home Exercise Regime. 11/17/2020 2. Degenerative disk disease lumbar spine: 09/15/2020 Refilled: Morphine ( MS Contin) 30 mg one  tablet every 12 hours #60 and Percocet 7.5/325 mg one tablet every 8 hours prn #90. Second script e-scribe for the following month. 11/17/2020 We will continue the opioid monitoring program, this consists of regular clinic visits, examinations, urine drug screen, pill counts as well as use of [place] Controlled Substance Reporting system. A 12 month History has been reviewed on the [place] Controlled Substance Reporting System on 11/17/2020. 3. Tobacco Abuse: Educated on smoking cessation: He verbalizes understanding. 11/17/2020     F/U in 2 Months

## 2020-11-20 ENCOUNTER — Ambulatory Visit (INDEPENDENT_AMBULATORY_CARE_PROVIDER_SITE_OTHER): Payer: 59 | Admitting: Family Medicine

## 2020-11-20 ENCOUNTER — Encounter: Payer: Self-pay | Admitting: Family Medicine

## 2020-11-20 ENCOUNTER — Other Ambulatory Visit: Payer: Self-pay

## 2020-11-20 VITALS — BP 123/68 | HR 76 | Temp 98.7°F | Ht 68.0 in | Wt 268.0 lb

## 2020-11-20 DIAGNOSIS — E119 Type 2 diabetes mellitus without complications: Secondary | ICD-10-CM | POA: Diagnosis not present

## 2020-11-20 MED ORDER — TRULICITY 1.5 MG/0.5ML ~~LOC~~ SOAJ: SUBCUTANEOUS | 11 refills | Status: DC

## 2020-11-20 NOTE — Progress Notes (Signed)
120-12  Subjective:  Patient ID: Joshua [name], male    DOB: 1975/11/15  Age: 45 y.o. MRN: [id number]  CC: Diabetes   HPI Joshua [name] presents forFollow-up of diabetes. Patient checks blood sugar at home.   80-90  fasting and 120 postprandial Patient denies symptoms such as polyuria, polydipsia, excessive hunger, nausea No significant hypoglycemic spells noted.  Pt. Declines medication. Feels he can lose weight and not need medication.   History Joshua [name] has a past medical history of Disorders of sacrum, Displacement of lumbar intervertebral disc without myelopathy, Facet syndrome, lumbar, Lumbago, and Thoracic or lumbosacral neuritis or radiculitis, unspecified.   He has a past surgical history that includes Knee surgery (Left, 2010).   His family history includes Diabetes in his father; Hypertension in his mother.He reports that he has been smoking cigarettes. He has a 25.00 pack-year smoking history. He has quit using smokeless tobacco. He reports that he does not drink alcohol and does not use drugs.  Current Outpatient Medications on File Prior to Visit  Medication Sig Dispense Refill   morphine (MS CONTIN) 30 MG 12 hr tablet Take 1 tablet (30 mg total) by mouth every 12 (twelve) hours. 60 tablet 0   oxyCODONE-acetaminophen (PERCOCET) 7.5-325 MG tablet Take 1 tablet by mouth every 8 (eight) hours as needed. 90 tablet 0   No current facility-administered medications on file prior to visit.    ROS Review of Systems noncontributory Objective:  BP 123/68   Pulse 76   Temp 98.7 F (37.1 C)   Ht 5\' 8"  (1.727 m)   Wt 268 lb (121.6 kg)   SpO2 97%   BMI 40.75 kg/m   BP Readings from Last 3 Encounters:  11/20/20 123/68  11/07/20 128/84  10/23/20 133/76    Wt Readings from Last 3 Encounters:  11/20/20 268 lb (121.6 kg)  11/07/20 266 lb 3.2 oz (120.7 kg)  10/23/20 268 lb 6.4 oz (121.7 kg)     Physical Exam Constitutional:      Appearance: Normal appearance. He is  obese.  HENT:     Head: Normocephalic.  Neurological:     General: No focal deficit present.     Mental Status: He is alert and oriented to person, place, and time.  Psychiatric:        Mood and Affect: Mood normal.        Thought Content: Thought content normal.      Assessment & Plan:   Joshua [name] was seen today for diabetes.  Diagnoses and all orders for this visit:  New onset type 2 diabetes mellitus (HCC)  Other orders -     Dulaglutide (TRULICITY) 1.5 MG/0.5ML SOPN; Inject content of one pen under the skin weekly for diabetes  Pt. Contested his diagnosis today. However, I explained it was based on A1c. Good blood sugars are desirable, but not proof there is no diabetes.    I am having Joshua [name] start on Trulicity. I am also having him maintain his morphine and oxyCODONE-acetaminophen.  Meds ordered this encounter  Medications   Dulaglutide (TRULICITY) 1.5 MG/0.5ML SOPN    Sig: Inject content of one pen under the skin weekly for diabetes    Dispense:  2 mL    Refill:  11  30 minutes was spent in face to face contact. Over 1/2 in discussion of DM care and treatment.  Pt. Agreed to Trulicity since it should help with both diabetes and weight loss.  Follow-up: Return in about 2  months (around 01/20/2021).  Mechele Claude, M.D.

## 2021-01-19 ENCOUNTER — Other Ambulatory Visit: Payer: Self-pay

## 2021-01-19 ENCOUNTER — Encounter: Payer: Self-pay | Admitting: Registered Nurse

## 2021-01-19 ENCOUNTER — Encounter: Payer: 59 | Attending: Physical Medicine & Rehabilitation | Admitting: Registered Nurse

## 2021-01-19 VITALS — BP 116/72 | HR 73 | Temp 99.0°F | Ht 68.0 in | Wt 259.0 lb

## 2021-01-19 DIAGNOSIS — Z79899 Other long term (current) drug therapy: Secondary | ICD-10-CM | POA: Insufficient documentation

## 2021-01-19 DIAGNOSIS — Z5181 Encounter for therapeutic drug level monitoring: Secondary | ICD-10-CM | POA: Diagnosis not present

## 2021-01-19 DIAGNOSIS — M5116 Intervertebral disc disorders with radiculopathy, lumbar region: Secondary | ICD-10-CM

## 2021-01-19 DIAGNOSIS — G894 Chronic pain syndrome: Secondary | ICD-10-CM | POA: Diagnosis not present

## 2021-01-19 DIAGNOSIS — M5416 Radiculopathy, lumbar region: Secondary | ICD-10-CM | POA: Insufficient documentation

## 2021-01-19 MED ORDER — MORPHINE SULFATE ER 30 MG PO TBCR: 30.0000 mg | EXTENDED_RELEASE_TABLET | Freq: Two times a day (BID) | ORAL | 0 refills | Status: DC

## 2021-01-19 MED ORDER — OXYCODONE-ACETAMINOPHEN 7.5-325 MG PO TABS: 1.0000 | ORAL_TABLET | Freq: Three times a day (TID) | ORAL | 0 refills | Status: DC | PRN

## 2021-01-19 NOTE — Progress Notes (Signed)
Subjective:    Patient ID: Joshua [name], male    DOB: [birth year], 45 y.o.   MRN: [id number]  HPI: Joshua [name] is a 45 y.o. male who returns for follow up appointment for chronic pain and medication refill. He states his pain is located in his mid- lower back and radiates into his left lower extremity occassionally. He rates his pain 4. His current exercise regime is walking and performing stretching exercises.  Mr. Manrique Morphine equivalent is 90.63 MME.   Last UDS was Performed on 09/15/2020, it was consistent.      Pain Inventory Average Pain 5 Pain Right Now 4 My pain is intermittent, constant, sharp, stabbing, and aching  In the last 24 hours, has pain interfered with the following? General activity 4 Relation with others 4 Enjoyment of life 3 What TIME of day is your pain at its worst? 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  Tobacco Use   Smoking status: Every Day    Packs/day: 1.00    Years: 25.00    Pack years: 25.00    Types: Cigarettes    Last attempt to quit: 10/23/2013    Years since quitting: 7.2   Smokeless tobacco: Former   Tobacco comments:    .5ppd cigarettes-11/07/20  Vaping Use   Vaping Use: Never used  Substance and Sexual Activity   Alcohol use: No    Alcohol/week: 0.0 standard drinks   Drug use: No   Sexual activity: Yes    Birth control/protection: Condom  Other Topics Concern   Not on file  Social History Narrative   Not on  file   Social Determinants of Health   Financial Resource Strain: Not on file  Food Insecurity: Not on file  Transportation Needs: Not on file  Physical Activity: Not on file  Stress: Not on file  Social Connections: Not on file   Past Surgical History:  Procedure Laterality Date   KNEE SURGERY Left 2010   Past Surgical History:  Procedure Laterality Date   KNEE SURGERY Left 2010   Past Medical History:  Diagnosis Date   Disorders of sacrum    Displacement of lumbar intervertebral disc without myelopathy    Facet syndrome, lumbar    Lumbago    Thoracic or lumbosacral neuritis or radiculitis, unspecified    BP 116/72   Pulse 73   Temp 99 F (37.2 C)   Ht 5\' 8"  (1.727 m)   Wt 259 lb (117.5 kg)   SpO2 98%   BMI 39.38 kg/m   Opioid Risk Score:   Fall Risk Score:  `1  Depression screen PHQ 2/9  Depression screen [facility] 2/9 11/20/2020 11/17/2020 10/23/2020 09/15/2020 07/17/2020 05/19/2020 05/19/2020  Decreased Interest 0 0 0 0 0 0 0  Down, Depressed, Hopeless 0 0 0 0 0 0 0  PHQ - 2 Score 0 0 0 0 0 0 0  Altered sleeping - - - - - - -  Tired, decreased energy - - - - - - -  Change in appetite - - - - - - -  Feeling bad or failure about yourself  - - - - - - -  Trouble concentrating - - - - - - -  Moving slowly or fidgety/restless - - - - - - -  Suicidal thoughts - - - - - - -  PHQ-9 Score - - - - - - -    Review of Systems  Musculoskeletal:  Positive for back pain. Negative for gait problem.       Left leg pain  All other systems reviewed and are negative.     Objective:   Physical Exam Vitals and nursing note reviewed.  Constitutional:      Appearance: Normal appearance. He is obese.  Cardiovascular:     Rate and Rhythm: Normal rate and regular rhythm.     Pulses: Normal pulses.     Heart sounds: Normal heart sounds.  Pulmonary:     Effort: Pulmonary effort is normal.     Breath sounds: Normal breath sounds.  Musculoskeletal:     Cervical back: Normal range of  motion and neck supple.     Comments: Normal Muscle Bulk and Muscle Testing Reveals:  Upper Extremities: Full ROM and Muscle Strength 5/5  Lumbar Paraspinal Tenderness: L-3-L-5 Lower Extremities: Full ROM and Muscle Strength 5/5 Arises from chair with ease Narrow Based  Gait     Skin:    General: Skin is warm and dry.  Neurological:     Mental Status: He is alert and oriented to person, place, and time.  Psychiatric:        Mood and Affect: Mood normal.        Behavior: Behavior normal.         Assessment & Plan:  1. Chronic low back pain/ Lumbar Radiculopathy: Continue with Home Exercise Regime. 01/19/2021 2. Degenerative disk disease lumbar spine: 01/19/2021 Refilled: Morphine ( MS Contin) 30 mg one  tablet every 12 hours #60 and Percocet 7.5/325 mg one tablet every 8 hours prn #90. Second script e-scribe for the following month. 01/19/2021 We will continue the opioid monitoring program, this consists of regular clinic visits, examinations, urine drug screen, pill counts as well as use of [place] Controlled Substance Reporting system. A 12 month History has been reviewed on the [place] Controlled Substance Reporting System on 01/19/2021. 3. Tobacco Abuse: Educated on smoking cessation: He verbalizes understanding. 01/19/2021  4. Morbid Obesity: Continue with Diabetic Diet and HEP as tolerated. Continue to monitor.    F/U in 2 Months

## 2021-01-24 ENCOUNTER — Ambulatory Visit: Payer: 59 | Admitting: Family Medicine

## 2021-03-08 ENCOUNTER — Ambulatory Visit: Payer: 59 | Admitting: Family Medicine

## 2021-03-16 ENCOUNTER — Encounter: Payer: 59 | Attending: Physical Medicine & Rehabilitation | Admitting: Registered Nurse

## 2021-03-16 ENCOUNTER — Other Ambulatory Visit: Payer: Self-pay

## 2021-03-16 ENCOUNTER — Ambulatory Visit: Payer: 59 | Admitting: Registered Nurse

## 2021-03-16 VITALS — BP 125/81 | HR 75 | Ht 68.0 in | Wt 257.0 lb

## 2021-03-16 DIAGNOSIS — M5116 Intervertebral disc disorders with radiculopathy, lumbar region: Secondary | ICD-10-CM

## 2021-03-16 DIAGNOSIS — Z79899 Other long term (current) drug therapy: Secondary | ICD-10-CM

## 2021-03-16 DIAGNOSIS — M5416 Radiculopathy, lumbar region: Secondary | ICD-10-CM | POA: Diagnosis present

## 2021-03-16 DIAGNOSIS — Z5181 Encounter for therapeutic drug level monitoring: Secondary | ICD-10-CM | POA: Diagnosis present

## 2021-03-16 DIAGNOSIS — G894 Chronic pain syndrome: Secondary | ICD-10-CM | POA: Diagnosis present

## 2021-03-16 MED ORDER — OXYCODONE-ACETAMINOPHEN 7.5-325 MG PO TABS: 1.0000 | ORAL_TABLET | Freq: Three times a day (TID) | ORAL | 0 refills | Status: DC | PRN

## 2021-03-16 MED ORDER — MORPHINE SULFATE ER 30 MG PO TBCR: 30.0000 mg | EXTENDED_RELEASE_TABLET | Freq: Two times a day (BID) | ORAL | 0 refills | Status: DC

## 2021-03-16 NOTE — Progress Notes (Signed)
Subjective:    Patient ID: Joshua [name], male    DOB: 1975/11/04, 46 y.o.   MRN: [id number]  HPI: Joshua [name] is a 46 y.o. male who returns for follow up appointment for chronic pain and medication refill. He states his pain is located in his lower back. He rates his pain 5. His current exercise regime is walking and performing stretching exercises.  Joshua [name] Morphine equivalent is 93.75 MME.   UDS ordered today.      Pain Inventory Average Pain 5 Pain Right Now 5 My pain is [name], stabbing, and aching  In the last 24 hours, has pain interfered with the following? General activity 4 Relation with others 3 Enjoyment of life 3 What TIME of day is your pain at its worst? 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  Tobacco Use   Smoking status: Every Day    Packs/day: 1.00    Years: 25.00    Pack years: 25.00    Types: Cigarettes    Last attempt to quit: 10/23/2013    Years since quitting: 7.4   Smokeless tobacco: Former   Tobacco comments:    .5ppd cigarettes-11/07/20  Vaping Use   Vaping Use: Never used  Substance and Sexual Activity   Alcohol use: No    Alcohol/week: 0.0 standard drinks   Drug use: No   Sexual activity: Yes    Birth control/protection: Condom  Other Topics Concern   Not on file  Social History Narrative   Not on file   Social Determinants of Health   Financial Resource Strain: Not on file  Food Insecurity: Not on file  Transportation Needs: Not on file   Physical Activity: Not on file  Stress: Not on file  Social Connections: Not on file   Past Surgical History:  Procedure Laterality Date   KNEE SURGERY Left 2010   Past Surgical History:  Procedure Laterality Date   KNEE SURGERY Left 2010   Past Medical History:  Diagnosis Date   Disorders of sacrum    Displacement of lumbar intervertebral disc without myelopathy    Facet syndrome, lumbar    Lumbago    Thoracic or lumbosacral neuritis or radiculitis, unspecified    BP 125/81    Pulse 75    Ht 5\' 8"  (1.727 m)    Wt 257 lb (116.6 kg)    SpO2 97%    BMI 39.08 kg/m   Opioid Risk Score:   Fall Risk Score:  `1  Depression screen PHQ 2/9  Depression screen [facility] 2/9 01/19/2021 11/20/2020 11/17/2020 10/23/2020 09/15/2020 07/17/2020 05/19/2020  Decreased Interest 0 0 0 0 0 0 0  Down, Depressed, Hopeless 0 0 0 0 0 0 0  PHQ - 2 Score 0 0 0 0 0 0 0  Altered sleeping - - - - - - -  Tired, decreased energy - - - - - - -  Change in appetite - - - - - - -  Feeling bad or failure about yourself  - - - - - - -  Trouble concentrating - - - - - - -  Moving slowly or fidgety/restless - - - - - - -  Suicidal thoughts - - - - - - -  PHQ-9 Score - - - - - - -    Review of Systems  Musculoskeletal:  Positive for back pain.  All other systems reviewed and are negative.     Objective:   Physical Exam Vitals and nursing note reviewed.  Constitutional:      Appearance: Normal appearance.  Cardiovascular:     Rate and Rhythm: Normal rate and regular rhythm.     Pulses: Normal pulses.     Heart sounds: Normal heart sounds.  Pulmonary:     Effort: Pulmonary effort is normal.     Breath sounds: Normal breath sounds.  Musculoskeletal:     Cervical back: Normal range of motion and neck supple.     Comments: Normal Muscle Bulk and Muscle Testing Reveals:  Upper Extremities: Full ROM and Muscle Strength 5/5 Lumbar Paraspinal Tenderness: L-3-L-5 Lower Extremities : Full ROM ad Muscle Strength  5/5 Arises from Table with Ease Narrow Based Gait     Skin:    General: Skin is warm and dry.  Neurological:     Mental Status: He is alert and oriented to person, place, and time.  Psychiatric:        Mood and Affect: Mood normal.        Behavior: Behavior normal.         Assessment & Plan:  1. Chronic low back pain/ Lumbar Radiculopathy: Continue with Home Exercise Regime. 03/16/2021 2. Degenerative disk disease lumbar spine: 03/16/2021 Refilled: Morphine ( MS Contin) 30 mg one  tablet every 12 hours #60 and Percocet 7.5/325 mg one tablet every 8 hours prn #90. Second script e-scribe for the following month. 03/16/2021 We will continue the opioid monitoring program, this consists of regular clinic visits, examinations, urine drug screen, pill counts as well as use of [place] Controlled Substance Reporting system. A 12 month History has been reviewed on the [place] Controlled Substance Reporting System on 03/16/2021. 3. Tobacco Abuse: Educated on smoking cessation: He verbalizes understanding. 03/16/2021  4. Morbid Obesity: Continue with Diabetic Diet and HEP as tolerated. Continue to monitor. 03/16/2021   F/U in 2 Months

## 2021-03-20 ENCOUNTER — Encounter: Payer: Self-pay | Admitting: Registered Nurse

## 2021-03-21 ENCOUNTER — Telehealth: Payer: Self-pay

## 2021-03-21 NOTE — Telephone Encounter (Signed)
Morphine Sulfate ER approved 03/21/21-09/18/21

## 2021-03-21 NOTE — Telephone Encounter (Signed)
[employer] [facility] to initiate PA for Morphine Sulfate ER. Case # [id number]

## 2021-03-21 NOTE — Telephone Encounter (Signed)
[employer] to initiate PA for [employer]. Case [id number]

## 2021-03-22 LAB — TOXASSURE SELECT,+ANTIDEPR,UR

## 2021-03-22 NOTE — Telephone Encounter (Signed)
Percocet has been approved 03/21/21-09/18/21

## 2021-03-23 ENCOUNTER — Telehealth: Payer: Self-pay | Admitting: *Deleted

## 2021-03-23 NOTE — Telephone Encounter (Signed)
Urine drug screen for this encounter is consistent for prescribed medication 

## 2021-03-26 ENCOUNTER — Telehealth: Payer: Self-pay | Admitting: *Deleted

## 2021-03-26 ENCOUNTER — Ambulatory Visit: Payer: 59 | Admitting: Family Medicine

## 2021-03-26 NOTE — Telephone Encounter (Signed)
Jimin Brisco Key: [id number] - PA Case ID: [id number] - Rx #: [id number] Need help? Call us at [phone] Status Sent to [facility] Trulicity 1.5MG /0.5ML pen-injectors Form Production designer, theatre/television/film Prior Authorization Form

## 2021-03-29 NOTE — Telephone Encounter (Signed)
Approvedtoday PA Case: [id number], Status: Approved, Coverage Starts on: 03/26/2021 12:00 AM, Coverage Ends on: 03/26/2022 12:00 AM. Questions? Contact [phone].  Pharmacy aware.

## 2021-04-25 ENCOUNTER — Ambulatory Visit: Payer: 59 | Admitting: Family Medicine

## 2021-05-11 ENCOUNTER — Encounter: Payer: Self-pay | Admitting: Registered Nurse

## 2021-05-11 ENCOUNTER — Encounter: Payer: 59 | Attending: Physical Medicine & Rehabilitation | Admitting: Registered Nurse

## 2021-05-11 ENCOUNTER — Other Ambulatory Visit: Payer: Self-pay

## 2021-05-11 VITALS — BP 133/81 | HR 69 | Ht 68.0 in | Wt 258.0 lb

## 2021-05-11 DIAGNOSIS — M5416 Radiculopathy, lumbar region: Secondary | ICD-10-CM | POA: Insufficient documentation

## 2021-05-11 DIAGNOSIS — Z5181 Encounter for therapeutic drug level monitoring: Secondary | ICD-10-CM | POA: Insufficient documentation

## 2021-05-11 DIAGNOSIS — Z79899 Other long term (current) drug therapy: Secondary | ICD-10-CM | POA: Insufficient documentation

## 2021-05-11 DIAGNOSIS — M5116 Intervertebral disc disorders with radiculopathy, lumbar region: Secondary | ICD-10-CM | POA: Diagnosis not present

## 2021-05-11 DIAGNOSIS — G894 Chronic pain syndrome: Secondary | ICD-10-CM | POA: Diagnosis not present

## 2021-05-11 MED ORDER — MORPHINE SULFATE ER 30 MG PO TBCR: 30.0000 mg | EXTENDED_RELEASE_TABLET | Freq: Two times a day (BID) | ORAL | 0 refills | Status: DC

## 2021-05-11 MED ORDER — OXYCODONE-ACETAMINOPHEN 7.5-325 MG PO TABS: 1.0000 | ORAL_TABLET | Freq: Three times a day (TID) | ORAL | 0 refills | Status: DC | PRN

## 2021-05-11 NOTE — Progress Notes (Signed)
? ?Subjective:  ? ? Patient ID: Joshua [name], male    DOB: [birth year], 46 y.o.   MRN: [id number] ? ?HPI: Joshua [name] is a 46 y.o. male who returns for follow up appointment for chronic pain and medication refill. He states his  pain is located in his lower back and occasionally into his left lower extremity. He rates his pain 5. His current exercise regime is walking and performing stretching exercises. ? ?Mr. Liotta Morphine equivalent is 93.75 MME.   Last UDS was Performed on 03/16/2021, it was consistent.  ?  ? ?Pain Inventory ?Average Pain 4 ?Pain Right Now 5 ?My pain is constant, stabbing, and aching ? ?In the last 24 hours, has pain interfered with the following? ?General activity 5 ?Relation with others 4 ?Enjoyment of life 5 ?What TIME of day is your pain at its worst? daytime and evening ?Sleep (in general) Good ? ?Pain is worse with: walking, bending, sitting, and standing ?Pain improves with: rest, heat/ice, therapy/exercise, and medication ?Relief from Meds: 7 ? ?Family History  ?Problem Relation Age of Onset  ? Hypertension Mother   ? Diabetes Father   ? ?Social History  ? ?Socioeconomic History  ? Marital status: Single  ?  Spouse name: Not on file  ? Number of children: Not on file  ? Years of education: Not on file  ? Highest education level: Not on file  ?Occupational History  ? Occupation: Biochemist, clinical  ?Tobacco Use  ? Smoking status: Every Day  ?  Packs/day: 1.00  ?  Years: 25.00  ?  Pack years: 25.00  ?  Types: Cigarettes  ?  Last attempt to quit: 10/23/2013  ?  Years since quitting: 7.5  ? Smokeless tobacco: Former  ? Tobacco comments:  ?  .5ppd cigarettes-11/07/20  ?Vaping Use  ? Vaping Use: Never used  ?Substance and Sexual Activity  ? Alcohol use: No  ?  Alcohol/week: 0.0 standard drinks  ? Drug use: No  ? Sexual activity: Yes  ?  Birth control/protection: Condom  ?Other Topics Concern  ? Not on file  ?Social History Narrative  ? Not on file  ? ?Social Determinants of Health   ? ?Financial Resource Strain: Not on file  ?Food Insecurity: Not on file  ?Transportation Needs: Not on file  ?Physical Activity: Not on file  ?Stress: Not on file  ?Social Connections: Not on file  ? ?Past Surgical History:  ?Procedure Laterality Date  ? KNEE SURGERY Left 2010  ? ?Past Surgical History:  ?Procedure Laterality Date  ? KNEE SURGERY Left 2010  ? ?Past Medical History:  ?Diagnosis Date  ? Disorders of sacrum   ? Displacement of lumbar intervertebral disc without myelopathy   ? Facet syndrome, lumbar   ? Lumbago   ? Thoracic or lumbosacral neuritis or radiculitis, unspecified   ? ?BP 133/81   Pulse 69   Ht 5\' 8"  (1.727 m)   Wt 258 lb (117 kg)   SpO2 98%   BMI 39.23 kg/m?  ? ?Opioid Risk Score:   ?Fall Risk Score:  `1 ? ?Depression screen PHQ 2/9 ? ?Depression screen [facility] 2/9 05/11/2021 01/19/2021 11/20/2020 11/17/2020 10/23/2020 09/15/2020 07/17/2020  ?Decreased Interest 0 0 0 0 0 0 0  ?Down, Depressed, Hopeless 0 0 0 0 0 0 0  ?PHQ - 2 Score 0 0 0 0 0 0 0  ?Altered sleeping - - - - - - -  ?Tired, decreased energy - - - - - - -  ?  Change in appetite - - - - - - -  ?Feeling bad or failure about yourself  - - - - - - -  ?Trouble concentrating - - - - - - -  ?Moving slowly or fidgety/restless - - - - - - -  ?Suicidal thoughts - - - - - - -  ?PHQ-9 Score - - - - - - -  ?  ? ?Review of Systems  ?Musculoskeletal:  Positive for back pain.  ?     Back of left leg pain  ?All other systems reviewed and are negative. ? ?   ?Objective:  ? Physical Exam ?Vitals and nursing note reviewed.  ?Constitutional:   ?   Appearance: Normal appearance.  ?Cardiovascular:  ?   Rate and Rhythm: Normal rate and regular rhythm.  ?   Pulses: Normal pulses.  ?   Heart sounds: Normal heart sounds.  ?Pulmonary:  ?   Effort: Pulmonary effort is normal.  ?   Breath sounds: Normal breath sounds.  ?Musculoskeletal:  ?   Cervical back: Normal range of motion and neck supple.  ?   Comments: Normal Muscle Bulk and Muscle Testing Reveals:  ?Upper  Extremities: Full ROM and Muscle Strength 5/5 ?Lumbar Paraspinal Tenderness: L-4-L-5 ?Lower Extremities : Full ROM and Muscle Strength 5/5 ?Arises from Chair with ease ?Narrow Based Gait  ?   ?Skin: ?   General: Skin is warm and dry.  ?Neurological:  ?   Mental Status: He is alert and oriented to person, place, and time.  ?Psychiatric:     ?   Mood and Affect: Mood normal.     ?   Behavior: Behavior normal.  ? ? ? ? ?   ?Assessment & Plan:  ?1. Chronic low back pain/ Lumbar Radiculopathy: Continue with Home Exercise Regime. 05/11/2021 ?2. Degenerative disk disease lumbar spine: 05/11/2021 ?Refilled: Morphine ( MS Contin) 30 mg one  tablet every 12 hours #60 and Percocet 7.5/325 mg one tablet every 8 hours prn #90. Second script e-scribe for the following month. 05/11/2021 ?We will continue the opioid monitoring program, this consists of regular clinic visits, examinations, urine drug screen, pill counts as well as use of [place] Controlled Substance Reporting system. A 12 month History has been reviewed on the [place] Controlled Substance Reporting System on 05/11/2021. ?3. Tobacco Abuse: Educated on smoking cessation: He verbalizes understanding. 05/11/2021 ? 4. Morbid Obesity: Continue with Diabetic Diet and HEP as tolerated. Continue to monitor. 05/11/2021 ?  ?F/U in 2 Months ?  ? ? ? ? ? ? ? ?

## 2021-06-18 ENCOUNTER — Ambulatory Visit: Payer: Self-pay | Admitting: Family Medicine

## 2021-06-20 ENCOUNTER — Encounter: Payer: Self-pay | Admitting: Family Medicine

## 2021-07-13 ENCOUNTER — Encounter: Payer: 59 | Attending: Physical Medicine & Rehabilitation | Admitting: Registered Nurse

## 2021-07-13 VITALS — BP 146/86 | HR 66 | Ht 68.0 in | Wt 259.0 lb

## 2021-07-13 DIAGNOSIS — M5416 Radiculopathy, lumbar region: Secondary | ICD-10-CM | POA: Diagnosis present

## 2021-07-13 DIAGNOSIS — G894 Chronic pain syndrome: Secondary | ICD-10-CM | POA: Insufficient documentation

## 2021-07-13 DIAGNOSIS — Z5181 Encounter for therapeutic drug level monitoring: Secondary | ICD-10-CM | POA: Insufficient documentation

## 2021-07-13 DIAGNOSIS — Z79891 Long term (current) use of opiate analgesic: Secondary | ICD-10-CM | POA: Insufficient documentation

## 2021-07-13 DIAGNOSIS — M5116 Intervertebral disc disorders with radiculopathy, lumbar region: Secondary | ICD-10-CM | POA: Diagnosis not present

## 2021-07-13 MED ORDER — OXYCODONE-ACETAMINOPHEN 7.5-325 MG PO TABS: 1.0000 | ORAL_TABLET | Freq: Three times a day (TID) | ORAL | 0 refills | Status: DC | PRN

## 2021-07-13 MED ORDER — MORPHINE SULFATE ER 30 MG PO TBCR: 30.0000 mg | EXTENDED_RELEASE_TABLET | Freq: Two times a day (BID) | ORAL | 0 refills | Status: DC

## 2021-07-13 NOTE — Progress Notes (Signed)
? ?Subjective:  ? ? Patient ID: Joshua [name], male    DOB: Aug 14, 1975, 46 y.o.   MRN: [id number] ? ?HPI: Joshua [name] is a 46 y.o. male who returns for follow up appointment for chronic pain and medication refill. He states his pain is located in his lower back and occasionally radiating into his left lower extremity. He rates his pain 5. His current exercise regime is walking and performing stretching exercises. ?  ?Mr. Kavanaugh Morphine equivalent is 93.75 MME.   UDS ordered today. ? ?Pain Inventory ?Average Pain 4 ?Pain Right Now 5 ?My pain is sharp, stabbing, and aching ? ?In the last 24 hours, has pain interfered with the following? ?General activity 4 ?Relation with others 5 ?Enjoyment of life 4 ?What TIME of day is your pain at its worst? daytime and evening ?Sleep (in general) Fair ? ?Pain is worse with: walking, bending, sitting, and standing ?Pain improves with: rest, therapy/exercise, and medication ?Relief from Meds: 6 ? ?Family History  ?Problem Relation Age of Onset  ? Hypertension Mother   ? Diabetes Father   ? ?Social History  ? ?Socioeconomic History  ? Marital status: Single  ?  Spouse name: Not on file  ? Number of children: Not on file  ? Years of education: Not on file  ? Highest education level: Not on file  ?Occupational History  ? Occupation: Biochemist, clinical  ?Tobacco Use  ? Smoking status: Every Day  ?  Packs/day: 1.00  ?  Years: 25.00  ?  Pack years: 25.00  ?  Types: Cigarettes  ?  Last attempt to quit: 10/23/2013  ?  Years since quitting: 7.7  ? Smokeless tobacco: Former  ? Tobacco comments:  ?  .5ppd cigarettes-11/07/20  ?Vaping Use  ? Vaping Use: Never used  ?Substance and Sexual Activity  ? Alcohol use: No  ?  Alcohol/week: 0.0 standard drinks  ? Drug use: No  ? Sexual activity: Yes  ?  Birth control/protection: Condom  ?Other Topics Concern  ? Not on file  ?Social History Narrative  ? Not on file  ? ?Social Determinants of Health  ? ?Financial Resource Strain: Not on file  ?Food  Insecurity: Not on file  ?Transportation Needs: Not on file  ?Physical Activity: Not on file  ?Stress: Not on file  ?Social Connections: Not on file  ? ?Past Surgical History:  ?Procedure Laterality Date  ? KNEE SURGERY Left 2010  ? ?Past Surgical History:  ?Procedure Laterality Date  ? KNEE SURGERY Left 2010  ? ?Past Medical History:  ?Diagnosis Date  ? Disorders of sacrum   ? Displacement of lumbar intervertebral disc without myelopathy   ? Facet syndrome, lumbar   ? Lumbago   ? Thoracic or lumbosacral neuritis or radiculitis, unspecified   ? ?BP (!) 146/86   Pulse 66   Ht 5\' 8"  (1.727 m)   Wt 259 lb (117.5 kg)   SpO2 97%   BMI 39.38 kg/m?  ? ?Opioid Risk Score:   ?Fall Risk Score:  `1 ? ?Depression screen PHQ 2/9 ? ? ?  07/13/2021  ?  9:18 AM 05/11/2021  ?  8:18 AM 01/19/2021  ?  8:28 AM 11/20/2020  ?  3:28 PM 11/17/2020  ?  8:08 AM 10/23/2020  ? 10:43 AM 09/15/2020  ?  8:16 AM  ?Depression screen PHQ 2/9  ?Decreased Interest 0 0 0 0 0 0 0  ?Down, Depressed, Hopeless 0 0 0 0 0 0 0  ?  PHQ - 2 Score 0 0 0 0 0 0 0  ?  ? ?Review of Systems  ?Musculoskeletal:  Positive for back pain.  ?     Left leg pain  ?All other systems reviewed and are negative. ? ?   ?Objective:  ? Physical Exam ?Vitals and nursing note reviewed.  ?Constitutional:   ?   Appearance: Normal appearance.  ?Cardiovascular:  ?   Rate and Rhythm: Normal rate and regular rhythm.  ?   Pulses: Normal pulses.  ?   Heart sounds: Normal heart sounds.  ?Pulmonary:  ?   Effort: Pulmonary effort is normal.  ?   Breath sounds: Normal breath sounds.  ?Musculoskeletal:  ?   Cervical back: Normal range of motion and neck supple.  ?   Comments: Normal Muscle Bulk and Muscle Testing Reveals:  ?Upper Extremities: Full ROM and Muscle Strength 5/5 ? Lumbar Paraspinal Tenderness:L-4-L-5 ?Lower Extremities: Full ROM and Muscle Strength 5/5 ?Arises from chair with ease ?Narrow Based  Gait  ?   ?Skin: ?   General: Skin is warm and dry.  ?Neurological:  ?   Mental Status: He is  alert and oriented to person, place, and time.  ?Psychiatric:     ?   Mood and Affect: Mood normal.     ?   Behavior: Behavior normal.  ? ? ? ? ?   ?Assessment & Plan:  ?1. Chronic low back pain/ Lumbar Radiculopathy: Continue with Home Exercise Regime. 07/13/2021 ?2. Degenerative disk disease lumbar spine: 05/11/2021 ?Refilled: Morphine ( MS Contin) 30 mg one  tablet every 12 hours #60 and Percocet 7.5/325 mg one tablet every 8 hours prn #90. Second script e-scribe for the following month. 07/13/2021 ?We will continue the opioid monitoring program, this consists of regular clinic visits, examinations, urine drug screen, pill counts as well as use of [place] Controlled Substance Reporting system. A 12 month History has been reviewed on the [place] Controlled Substance Reporting System on 07/13/2021. ?3. Tobacco Abuse: Educated on smoking cessation: He verbalizes understanding. 07/13/2021 ? 4. Morbid Obesity: Continue with Diabetic Diet and HEP as tolerated. Continue to monitor. 07/13/2021 ?  ?F/U in 2 Months ?  ? ?

## 2021-07-17 LAB — TOXASSURE SELECT,+ANTIDEPR,UR

## 2021-07-19 ENCOUNTER — Telehealth: Payer: Self-pay | Admitting: *Deleted

## 2021-07-19 NOTE — Telephone Encounter (Signed)
Urine drug screen for this encounter is consistent for prescribed medication 

## 2021-08-13 ENCOUNTER — Telehealth: Payer: Self-pay | Admitting: *Deleted

## 2021-08-13 DIAGNOSIS — M5116 Intervertebral disc disorders with radiculopathy, lumbar region: Secondary | ICD-10-CM

## 2021-08-13 MED ORDER — OXYCODONE-ACETAMINOPHEN 7.5-325 MG PO TABS: 1.0000 | ORAL_TABLET | Freq: Three times a day (TID) | ORAL | 0 refills | Status: DC | PRN

## 2021-08-13 NOTE — Telephone Encounter (Signed)
PMP was Reviewed.  Oxycodone e-scribed today.  Sybil RN called Mr. Hardgrove regarding the above.

## 2021-08-13 NOTE — Telephone Encounter (Signed)
Pharmacy is out of oxy 7.5/325.  Send to [facility] in [place]

## 2021-09-10 ENCOUNTER — Encounter: Payer: Self-pay | Admitting: Registered Nurse

## 2021-09-10 ENCOUNTER — Encounter: Payer: 59 | Attending: Physical Medicine & Rehabilitation | Admitting: Registered Nurse

## 2021-09-10 VITALS — BP 131/88 | HR 64 | Ht 68.0 in | Wt 257.8 lb

## 2021-09-10 DIAGNOSIS — M5116 Intervertebral disc disorders with radiculopathy, lumbar region: Secondary | ICD-10-CM | POA: Insufficient documentation

## 2021-09-10 DIAGNOSIS — G894 Chronic pain syndrome: Secondary | ICD-10-CM | POA: Diagnosis not present

## 2021-09-10 DIAGNOSIS — Z79891 Long term (current) use of opiate analgesic: Secondary | ICD-10-CM | POA: Diagnosis not present

## 2021-09-10 DIAGNOSIS — Z5181 Encounter for therapeutic drug level monitoring: Secondary | ICD-10-CM | POA: Insufficient documentation

## 2021-09-10 MED ORDER — OXYCODONE-ACETAMINOPHEN 7.5-325 MG PO TABS: 1.0000 | ORAL_TABLET | Freq: Three times a day (TID) | ORAL | 0 refills | Status: DC | PRN

## 2021-09-10 MED ORDER — MORPHINE SULFATE ER 30 MG PO TBCR: 30.0000 mg | EXTENDED_RELEASE_TABLET | Freq: Two times a day (BID) | ORAL | 0 refills | Status: DC

## 2021-09-10 NOTE — Progress Notes (Unsigned)
Subjective:    Patient ID: Joshua [name], male    DOB: Jul 12, 1975, 46 y.o.   MRN: [id number]  HPI: Joshua [name] is a 46 y.o. male who returns for follow up appointment for chronic pain and medication refill. He states his pain is located in his lower back. He rates his pain 5. His current exercise regime is walking and performing stretching exercises.  Mr. Ouzts Morphine equivalent is 93.75 MME.   Last UDS was Performed on 07/13/2021, it was consistent.     Pain Inventory Average Pain 5 Pain Right Now 5 My pain is stabbing and aching  In the last 24 hours, has pain interfered with the following? General activity 4 Relation with others 4 Enjoyment of life 4 What TIME of day is your pain at its worst? daytime and evening Sleep (in general) Fair  Pain is worse with: walking, bending, sitting, and standing Pain improves with: rest, therapy/exercise, and medication Relief from Meds: 6  Family History  Problem Relation Age of Onset   Hypertension Mother    Diabetes Father    Social History   Socioeconomic History   Marital status: Single    Spouse name: Not on file   Number of children: Not on file   Years of education: Not on file   Highest education level: Not on file  Occupational History   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  Tobacco Use   Smoking status: Every Day    Packs/day: 1.00    Years: 25.00    Total pack years: 25.00    Types: Cigarettes    Last attempt to quit: 10/23/2013    Years since quitting: 7.8   Smokeless tobacco: Former   Tobacco comments:    .5ppd cigarettes-11/07/20  Vaping Use   Vaping Use: Never used  Substance and Sexual Activity   Alcohol use: No    Alcohol/week: 0.0 standard drinks of alcohol   Drug use: No   Sexual activity: Yes    Birth control/protection: Condom  Other Topics Concern   Not on file  Social History Narrative   Not on file   Social Determinants of Health   Financial Resource Strain: Not on file  Food  Insecurity: Not on file  Transportation Needs: Not on file  Physical Activity: Not on file  Stress: Not on file  Social Connections: Not on file   Past Surgical History:  Procedure Laterality Date   KNEE SURGERY Left 2010   Past Surgical History:  Procedure Laterality Date   KNEE SURGERY Left 2010   Past Medical History:  Diagnosis Date   Disorders of sacrum    Displacement of lumbar intervertebral disc without myelopathy    Facet syndrome, lumbar    Lumbago    Thoracic or lumbosacral neuritis or radiculitis, unspecified    BP 131/88   Pulse 64   Ht 5\' 8"  (1.727 m)   Wt 257 lb 12.8 oz (116.9 kg)   SpO2 96%   BMI 39.20 kg/m   Opioid Risk Score:   Fall Risk Score:  `1  Depression screen [facility] 2/9     09/10/2021    9:07 AM 07/13/2021    9:18 AM 05/11/2021    8:18 AM 01/19/2021    8:28 AM 11/20/2020    3:28 PM 11/17/2020    8:08 AM 10/23/2020   10:43 AM  Depression screen PHQ 2/9  Decreased Interest 0 0 0 0 0 0 0  Down, Depressed, Hopeless 0 0 0 0 0  0 0  PHQ - 2 Score 0 0 0 0 0 0 0     Review of Systems  Constitutional: Negative.   HENT: Negative.    Eyes: Negative.   Respiratory: Negative.    Cardiovascular: Negative.   Gastrointestinal: Negative.   Endocrine: Negative.   Genitourinary: Negative.   Musculoskeletal:  Positive for back pain.  Skin: Negative.   Allergic/Immunologic: Negative.   Neurological: Negative.   Hematological: Negative.   Psychiatric/Behavioral: Negative.        Objective:   Physical Exam Vitals and nursing note reviewed.  Constitutional:      Appearance: Normal appearance.  Cardiovascular:     Rate and Rhythm: Normal rate and regular rhythm.     Pulses: Normal pulses.     Heart sounds: Normal heart sounds.  Pulmonary:     Effort: Pulmonary effort is normal.     Breath sounds: Normal breath sounds.  Musculoskeletal:     Cervical back: Normal range of motion.     Comments: Normal Muscle Bulk and Muscle Testing Reveals:  Upper  Extremities: Full ROM and Muscle Strength 5/5  Lumbar Paraspinal Tenderness: L-4-L-5 Lower Extremities: Full ROM and Muscle Strength 5/5 Arises from chair with ease Narrow Based  Gait     Skin:    General: Skin is warm and dry.  Neurological:     Mental Status: He is alert and oriented to person, place, and time.  Psychiatric:        Mood and Affect: Mood normal.        Behavior: Behavior normal.         Assessment & Plan:  1. Chronic low back pain/ Lumbar Radiculopathy: Continue with Home Exercise Regime. 09/10/2021 2. Degenerative disk disease lumbar spine: 09/10/2021 Refilled: Morphine ( MS Contin) 30 mg one  tablet every 12 hours #60 and Percocet 7.5/325 mg one tablet every 8 hours prn #90. Second script e-scribe for the following month. 09/10/2021 We will continue the opioid monitoring program, this consists of regular clinic visits, examinations, urine drug screen, pill counts as well as use of [place] Controlled Substance Reporting system. A 12 month History has been reviewed on the [place] Controlled Substance Reporting System on 09/10/2021. 3. Tobacco Abuse: Educated on smoking cessation: He verbalizes understanding. 09/10/2021  4. Morbid Obesity: Continue with Diabetic Diet and HEP as tolerated. Continue to monitor. 09/10/2021   F/U in 2 Months

## 2021-11-09 ENCOUNTER — Encounter: Payer: 59 | Attending: Physical Medicine & Rehabilitation | Admitting: Registered Nurse

## 2021-11-09 ENCOUNTER — Encounter: Payer: Self-pay | Admitting: Registered Nurse

## 2021-11-09 VITALS — BP 147/86 | HR 63 | Ht 68.0 in | Wt 259.8 lb

## 2021-11-09 DIAGNOSIS — M5116 Intervertebral disc disorders with radiculopathy, lumbar region: Secondary | ICD-10-CM | POA: Insufficient documentation

## 2021-11-09 DIAGNOSIS — M5416 Radiculopathy, lumbar region: Secondary | ICD-10-CM | POA: Insufficient documentation

## 2021-11-09 DIAGNOSIS — G894 Chronic pain syndrome: Secondary | ICD-10-CM | POA: Insufficient documentation

## 2021-11-09 DIAGNOSIS — Z5181 Encounter for therapeutic drug level monitoring: Secondary | ICD-10-CM | POA: Insufficient documentation

## 2021-11-09 DIAGNOSIS — Z79891 Long term (current) use of opiate analgesic: Secondary | ICD-10-CM | POA: Insufficient documentation

## 2021-11-09 MED ORDER — OXYCODONE-ACETAMINOPHEN 7.5-325 MG PO TABS: 1.0000 | ORAL_TABLET | Freq: Three times a day (TID) | ORAL | 0 refills | Status: DC | PRN

## 2021-11-09 MED ORDER — MORPHINE SULFATE ER 30 MG PO TBCR: 30.0000 mg | EXTENDED_RELEASE_TABLET | Freq: Two times a day (BID) | ORAL | 0 refills | Status: DC

## 2021-11-09 NOTE — Progress Notes (Signed)
Subjective:    Patient ID: Joshua [name], male    DOB: 06-Jun-1975, 46 y.o.   MRN: [id number]  HPI: Joshua [name] is a 46 y.o. male who returns for follow up appointment for chronic pain and medication refill. He states his pain is located in his lower back and occasionally radiating into his left lower extremity.He  rates his pain 5. His  current exercise regime is walking and performing stretching exercises.  [name] equivalent is 91.75 MME.   Last UDS was Performed on 07/13/2021, it was consistent.     Pain Inventory Average Pain 5 Pain Right Now 5 My pain is sharp, stabbing, and aching  In the last 24 hours, has pain interfered with the following? General activity 3 Relation with others 3 Enjoyment of life 2 What TIME of day is your pain at its worst? daytime and evening Sleep (in general) Fair  Pain is worse with: walking, bending, sitting, and standing Pain improves with: rest, therapy/exercise, and medication Relief from Meds: 5  Family History  Problem Relation Age of Onset  . Hypertension Mother   . Diabetes Father    Social History   Socioeconomic History  . Marital status: Single    Spouse name: Not on file  . Number of children: Not on file  . Years of education: Not on file  . Highest education level: Not on file  Occupational History  . Occupation: Biochemist, clinical  Tobacco Use  . Smoking status: Every Day    Packs/day: 1.00    Years: 25.00    Total pack years: 25.00    Types: Cigarettes    Last attempt to quit: 10/23/2013    Years since quitting: 8.0  . Smokeless tobacco: Former  . Tobacco comments:    .5ppd cigarettes-11/07/20  Vaping Use  . Vaping Use: Never used  Substance and Sexual Activity  . Alcohol use: No    Alcohol/week: 0.0 standard drinks of alcohol  . Drug use: No  . Sexual activity: Yes    Birth control/protection: Condom  Other Topics Concern  . Not on file  Social History Narrative  . Not on file    Social Determinants of Health   Financial Resource Strain: Not on file  Food Insecurity: Not on file  Transportation Needs: Not on file  Physical Activity: Not on file  Stress: Not on file  Social Connections: Not on file   Past Surgical History:  Procedure Laterality Date  . KNEE SURGERY Left 2010   Past Surgical History:  Procedure Laterality Date  . KNEE SURGERY Left 2010   Past Medical History:  Diagnosis Date  . Disorders of sacrum   . Displacement of lumbar intervertebral disc without myelopathy   . Facet syndrome, lumbar   . Lumbago   . Thoracic or lumbosacral neuritis or radiculitis, unspecified    Pulse 66   Ht 5\' 8"  (1.727 m)   Wt 259 lb 12.8 oz (117.8 kg)   SpO2 97%   BMI 39.50 kg/m   Opioid Risk Score:   Fall Risk Score:  `1  Depression screen [facility] 2/9     11/09/2021    8:39 AM 09/10/2021    9:07 AM 07/13/2021    9:18 AM 05/11/2021    8:18 AM 01/19/2021    8:28 AM 11/20/2020    3:28 PM 11/17/2020    8:08 AM  Depression screen PHQ 2/9  Decreased Interest 0 0 0 0 0 0 0  Down,  Depressed, Hopeless 0 0 0 0 0 0 0  PHQ - 2 Score 0 0 0 0 0 0 0     Review of Systems  Constitutional: Negative.   HENT: Negative.    Eyes: Negative.   Respiratory: Negative.    Cardiovascular: Negative.   Gastrointestinal: Negative.   Endocrine: Negative.   Genitourinary: Negative.   Musculoskeletal:  Positive for back pain.  Skin: Negative.   Allergic/Immunologic: Negative.   Neurological: Negative.   Hematological: Negative.   Psychiatric/Behavioral: Negative.        Objective:   Physical Exam Vitals and nursing note reviewed.  Constitutional:      Appearance: Normal appearance. He is obese.  Cardiovascular:     Rate and Rhythm: Normal rate and regular rhythm.     Pulses: Normal pulses.     Heart sounds: Normal heart sounds.  Pulmonary:     Effort: Pulmonary effort is normal.     Breath sounds: Normal breath sounds.  Musculoskeletal:     Cervical back: Normal  range of motion and neck supple.     Comments: Normal Muscle Bulk and Muscle Testing Reveals:  Upper Extremities: Full ROM and Muscle Strength 5/5  Lumbar Paraspinal Tenderness: L-4-L-5 Lower Extremities : Full ROM and Muscle Strength 5/5 Arises from Table with ease Narrow Based Gait     Skin:    General: Skin is warm and dry.  Neurological:     Mental Status: He is alert and oriented to person, place, and time.  Psychiatric:        Mood and Affect: Mood normal.        Behavior: Behavior normal.         Assessment & Plan:  1. Chronic low back pain/ Lumbar Radiculopathy: Continue with Home Exercise Regime. 11/09/2021 2. Degenerative disk disease lumbar spine: 11/09/2021 Refilled: [name] ( MS Contin) 30 mg one  tablet every 12 hours #60 and Percocet 7.5/325 mg one tablet every 8 hours prn #90. Second script e-scribe for the following month. 11/09/2021 We will continue the opioid monitoring program, this consists of regular clinic visits, examinations, urine drug screen, pill counts as well as use of [place] Controlled Substance Reporting system. A 12 month History has been reviewed on the [place] Controlled Substance Reporting System on 11/09/2021. 3. Tobacco Abuse: Educated on smoking cessation: He verbalizes understanding. 11/09/2021  4. Morbid Obesity: Continue with Diabetic Diet and HEP as tolerated. Continue to monitor. 11/09/2021   F/U in 2 Months

## 2022-01-03 ENCOUNTER — Encounter: Payer: Self-pay | Attending: Physical Medicine & Rehabilitation | Admitting: Registered Nurse

## 2022-01-03 ENCOUNTER — Encounter: Payer: Self-pay | Admitting: Registered Nurse

## 2022-01-03 VITALS — BP 143/86 | HR 66 | Ht 68.0 in | Wt 255.0 lb

## 2022-01-03 DIAGNOSIS — G894 Chronic pain syndrome: Secondary | ICD-10-CM

## 2022-01-03 DIAGNOSIS — M5116 Intervertebral disc disorders with radiculopathy, lumbar region: Secondary | ICD-10-CM

## 2022-01-03 DIAGNOSIS — Z79891 Long term (current) use of opiate analgesic: Secondary | ICD-10-CM

## 2022-01-03 DIAGNOSIS — Z5181 Encounter for therapeutic drug level monitoring: Secondary | ICD-10-CM

## 2022-01-03 MED ORDER — MORPHINE SULFATE ER 30 MG PO TBCR: 30.0000 mg | EXTENDED_RELEASE_TABLET | Freq: Two times a day (BID) | ORAL | 0 refills | Status: DC

## 2022-01-03 MED ORDER — OXYCODONE-ACETAMINOPHEN 7.5-325 MG PO TABS: 1.0000 | ORAL_TABLET | Freq: Three times a day (TID) | ORAL | 0 refills | Status: DC | PRN

## 2022-01-03 NOTE — Progress Notes (Signed)
Subjective:    Patient ID: Joshua [name], male    DOB: Jun 17, 1975, 46 y.o.   MRN: [id number]  HPI: Joshua [name] is a 46 y.o. male who returns for follow up appointment for chronic pain and medication refill. He states his pain is located in his lower back. He rates his pain 5. His current exercise regime is walking and performing stretching exercises.  [name] Morphine equivalent is 93.75 MME.   Last UDS was Performed on 07/13/2021, it was consistent.    Pain Inventory Average Pain 4 Pain Right Now 5 My pain is constant, sharp, stabbing, and aching  In the last 24 hours, has pain interfered with the following? General activity 4 Relation with others 4 Enjoyment of life 3 What TIME of day is your pain at its worst? daytime and evening Sleep (in general) Good  Pain is worse with: walking, bending, sitting, and standing Pain improves with: rest, therapy/exercise, and medication Relief from Meds: 4  Family History  Problem Relation Age of Onset   Hypertension Mother    Diabetes Father    Social History   Socioeconomic History   Marital status: Single    Spouse name: Not on file   Number of children: Not on file   Years of education: Not on file   Highest education level: Not on file  Occupational History   Occupation: Dump Administrator  Tobacco Use   Smoking status: Every Day    Packs/day: 1.00    Years: 25.00    Total pack years: 25.00    Types: Cigarettes    Last attempt to quit: 10/23/2013    Years since quitting: 8.2   Smokeless tobacco: Former   Tobacco comments:    .5ppd cigarettes-11/07/20  Vaping Use   Vaping Use: Never used  Substance and Sexual Activity   Alcohol use: No    Alcohol/week: 0.0 standard drinks of alcohol   Drug use: No   Sexual activity: Yes    Birth control/protection: Condom  Other Topics Concern   Not on file  Social History Narrative   Not on file   Social Determinants of Health   Financial Resource Strain: Not on  file  Food Insecurity: Not on file  Transportation Needs: Not on file  Physical Activity: Not on file  Stress: Not on file  Social Connections: Not on file   Past Surgical History:  Procedure Laterality Date   KNEE SURGERY Left 2010   Past Surgical History:  Procedure Laterality Date   KNEE SURGERY Left 2010   Past Medical History:  Diagnosis Date   Disorders of sacrum    Displacement of lumbar intervertebral disc without myelopathy    Facet syndrome, lumbar    Lumbago    Thoracic or lumbosacral neuritis or radiculitis, unspecified    Wt 255 lb (115.7 kg)   BMI 38.77 kg/m   Opioid Risk Score:   Fall Risk Score:  `1  Depression screen [facility] 2/9     11/09/2021    8:39 AM 09/10/2021    9:07 AM 07/13/2021    9:18 AM 05/11/2021    8:18 AM 01/19/2021    8:28 AM 11/20/2020    3:28 PM 11/17/2020    8:08 AM  Depression screen PHQ 2/9  Decreased Interest 0 0 0 0 0 0 0  Down, Depressed, Hopeless 0 0 0 0 0 0 0  PHQ - 2 Score 0 0 0 0 0 0 0    Review of  Systems  Musculoskeletal:  Positive for back pain. Negative for gait problem.       Pain in the left buttock down to the left knee       Objective:   Physical Exam Vitals and nursing note reviewed.  Constitutional:      Appearance: Normal appearance.  Cardiovascular:     Rate and Rhythm: Normal rate and regular rhythm.     Pulses: Normal pulses.     Heart sounds: Normal heart sounds.  Pulmonary:     Effort: Pulmonary effort is normal.     Breath sounds: Normal breath sounds.  Musculoskeletal:     Cervical back: Normal range of motion and neck supple.     Comments: Normal Muscle Bulk and Muscle Testing Reveals:  Upper Extremities: Full ROM and Muscle Strength 5/5  Lumbar Paraspinal Tenderness: L-4-L-5 Lower Extremities : Full ROM and Muscle Strength 5/5 Arises from Chair with ease Narrow Based Gait     Skin:    General: Skin is warm and dry.  Neurological:     Mental Status: He is alert and oriented to person, place,  and time.  Psychiatric:        Mood and Affect: Mood normal.        Behavior: Behavior normal.         Assessment & Plan:  1. Chronic low back pain/ Lumbar Radiculopathy: Continue with Home Exercise Regime. 01/03/2022 2. Degenerative disk disease lumbar spine: 01/03/2022 Refilled: Morphine ( MS Contin) 30 mg one  tablet every 12 hours #60 and Percocet 7.5/325 mg one tablet every 8 hours prn #90. Second script e-scribe for the following month. 01/03/2022 We will continue the opioid monitoring program, this consists of regular clinic visits, examinations, urine drug screen, pill counts as well as use of [place] Controlled Substance Reporting system. A 12 month History has been reviewed on the [place] Controlled Substance Reporting System on 01/03/2022. 3. Tobacco Abuse: Educated on smoking cessation: He verbalizes understanding. 01/03/2022  4. Morbid Obesity: Continue with Diabetic Diet and HEP as tolerated. Continue to monitor. 01/03/2022   F/U in 2 Months

## 2022-01-11 ENCOUNTER — Ambulatory Visit: Payer: Self-pay | Admitting: Registered Nurse

## 2022-01-11 ENCOUNTER — Telehealth: Payer: Self-pay | Admitting: *Deleted

## 2022-01-11 DIAGNOSIS — M5116 Intervertebral disc disorders with radiculopathy, lumbar region: Secondary | ICD-10-CM

## 2022-01-11 MED ORDER — OXYCODONE-ACETAMINOPHEN 7.5-325 MG PO TABS: 1.0000 | ORAL_TABLET | Freq: Three times a day (TID) | ORAL | 0 refills | Status: DC | PRN

## 2022-01-11 NOTE — Telephone Encounter (Signed)
Mr Reppucci found his medication at [facility] in [place] on [address] Please send there.

## 2022-01-11 NOTE — Telephone Encounter (Signed)
PMP was Reviewed.  Oxycodone e-scribed today.  Call placed to Mr. Auriemma regarding the above, he verbalizes understanding.

## 2022-02-06 ENCOUNTER — Telehealth: Payer: Self-pay

## 2022-02-06 DIAGNOSIS — M5116 Intervertebral disc disorders with radiculopathy, lumbar region: Secondary | ICD-10-CM

## 2022-02-06 MED ORDER — MORPHINE SULFATE ER 30 MG PO TBCR: 30.0000 mg | EXTENDED_RELEASE_TABLET | Freq: Two times a day (BID) | ORAL | 0 refills | Status: DC

## 2022-02-06 MED ORDER — OXYCODONE-ACETAMINOPHEN 7.5-325 MG PO TABS: 1.0000 | ORAL_TABLET | Freq: Three times a day (TID) | ORAL | 0 refills | Status: DC | PRN

## 2022-02-06 NOTE — Telephone Encounter (Signed)
PMP was Reviewed.  Morphine and Oxycodone e-scribed today.  Joshua [name] is aware via My Chart message.

## 2022-02-06 NOTE — Telephone Encounter (Signed)
Patient called stating can you send his prescription for Morphine, Percocet to [facility].

## 2022-03-06 ENCOUNTER — Encounter: Payer: Self-pay | Attending: Physical Medicine & Rehabilitation | Admitting: Registered Nurse

## 2022-03-06 ENCOUNTER — Encounter: Payer: Self-pay | Admitting: Registered Nurse

## 2022-03-06 VITALS — BP 130/80 | HR 67 | Ht 68.0 in | Wt 261.0 lb

## 2022-03-06 DIAGNOSIS — G894 Chronic pain syndrome: Secondary | ICD-10-CM

## 2022-03-06 DIAGNOSIS — Z5181 Encounter for therapeutic drug level monitoring: Secondary | ICD-10-CM

## 2022-03-06 DIAGNOSIS — Z79891 Long term (current) use of opiate analgesic: Secondary | ICD-10-CM

## 2022-03-06 DIAGNOSIS — M5116 Intervertebral disc disorders with radiculopathy, lumbar region: Secondary | ICD-10-CM

## 2022-03-06 DIAGNOSIS — M5416 Radiculopathy, lumbar region: Secondary | ICD-10-CM

## 2022-03-06 MED ORDER — MORPHINE SULFATE ER 30 MG PO TBCR: 30.0000 mg | EXTENDED_RELEASE_TABLET | Freq: Two times a day (BID) | ORAL | 0 refills | Status: DC

## 2022-03-06 MED ORDER — OXYCODONE-ACETAMINOPHEN 7.5-325 MG PO TABS: 1.0000 | ORAL_TABLET | Freq: Three times a day (TID) | ORAL | 0 refills | Status: DC | PRN

## 2022-03-06 NOTE — Progress Notes (Signed)
Subjective:    Patient ID: Joshua [name], male    DOB: 18-Jul-1975, 46 y.o.   MRN: [id number]  HPI: Joshua [name] is a 46 y.o. male who returns for follow up appointment for chronic pain and medication refill. He states his pain is located in his lower back and occssionally radiates into his left lower extremity. He rates his pain 6. His current exercise regime is walking and performing stretching exercises.  Joshua [name] Morphine equivalent is 93.75 MME.   Last UDS was Performed on 07/13/2021, it was consistent.      Pain Inventory Average Pain 4 Pain Right Now 6 My pain is constant, sharp, stabbing, and aching  In the last 24 hours, has pain interfered with the following? General activity 5 Relation with others 5 Enjoyment of life 5 What TIME of day is your pain at its worst? 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  Tobacco Use   Smoking status: Every Day    Packs/day: 1.00    Years: 25.00    Total pack years: 25.00    Types: Cigarettes    Last attempt to quit: 10/23/2013    Years since quitting: 8.3   Smokeless tobacco: Former   Tobacco comments:    .5ppd cigarettes-11/07/20  Vaping Use   Vaping Use: Never used  Substance and Sexual Activity   Alcohol use: No    Alcohol/week: 0.0 standard drinks of alcohol   Drug use: No   Sexual activity: Yes    Birth control/protection: Condom  Other Topics Concern   Not on file  Social History Narrative   Not on file   Social  Determinants of Health   Financial Resource Strain: Not on file  Food Insecurity: Not on file  Transportation Needs: Not on file  Physical Activity: Not on file  Stress: Not on file  Social Connections: Not on file   Past Surgical History:  Procedure Laterality Date   KNEE SURGERY Left 2010   Past Surgical History:  Procedure Laterality Date   KNEE SURGERY Left 2010   Past Medical History:  Diagnosis Date   Disorders of sacrum    Displacement of lumbar intervertebral disc without myelopathy    Facet syndrome, lumbar    Lumbago    Thoracic or lumbosacral neuritis or radiculitis, unspecified    BP 130/80   Pulse 67   Ht 5\' 8"  (1.727 m)   Wt 261 lb (118.4 kg)   SpO2 95%   BMI 39.68 kg/m   Opioid Risk Score:   Fall Risk Score:  `1  Depression screen [facility] 2/9     03/06/2022    8:56 AM 01/03/2022    9:25 AM 11/09/2021    8:39 AM 09/10/2021    9:07 AM 07/13/2021    9:18 AM 05/11/2021    8:18 AM 01/19/2021    8:28 AM  Depression screen PHQ 2/9  Decreased Interest 0 0 0 0 0 0  0  Down, Depressed, Hopeless 0 0 0 0 0 0 0  PHQ - 2 Score 0 0 0 0 0 0 0    Review of Systems  Musculoskeletal:  Positive for back pain.       Pain in the : Left hip, buttock, & upper rear leg  All other systems reviewed and are negative.      Objective:   Physical Exam Vitals and nursing note reviewed.  Constitutional:      Appearance: Normal appearance.  Cardiovascular:     Rate and Rhythm: Normal rate and regular rhythm.     Pulses: Normal pulses.     Heart sounds: Normal heart sounds.  Pulmonary:     Effort: Pulmonary effort is normal.     Breath sounds: Normal breath sounds.  Musculoskeletal:     Cervical back: Normal range of motion and neck supple.     Comments: Normal Muscle Bulk and Muscle Testing Reveals:  Upper Extremities: Full ROM and Muscle Strength 5/5  Lumbar Paraspinal Tenderness: L-4-L-5 Lower Extremities: Full ROM and Muscle Strength 5/5 Arises from Table with  ease Narrow Based  Gait     Skin:    General: Skin is warm and dry.  Neurological:     Mental Status: He is alert and oriented to person, place, and time.  Psychiatric:        Mood and Affect: Mood normal.        Behavior: Behavior normal.         Assessment & Plan:  1. Chronic low back pain/ Lumbar Radiculopathy: Continue with Home Exercise Regime. 03/06/2022 2. Degenerative disk disease lumbar spine: 03/06/2022 Refilled: Morphine ( MS Contin) 30 mg one  tablet every 12 hours #60 and Percocet 7.5/325 mg one tablet every 8 hours prn #90. Second script e-scribe for the following month. 03/06/2022 We will continue the opioid monitoring program, this consists of regular clinic visits, examinations, urine drug screen, pill counts as well as use of [place] Controlled Substance Reporting system. A 12 month History has been reviewed on the [place] Controlled Substance Reporting System on 03/06/2022. 3. Tobacco Abuse: Educated on smoking cessation: He verbalizes understanding. 03/06/2022  4. Morbid Obesity: Continue with Diabetic Diet and HEP as tolerated. Continue to monitor. 03/06/2022   F/U in 2 Months

## 2022-05-06 ENCOUNTER — Encounter: Payer: Self-pay | Attending: Physical Medicine & Rehabilitation | Admitting: Registered Nurse

## 2022-05-06 ENCOUNTER — Encounter: Payer: Self-pay | Admitting: Registered Nurse

## 2022-05-06 VITALS — BP 135/86 | HR 66 | Ht 68.0 in | Wt 259.0 lb

## 2022-05-06 DIAGNOSIS — Z5181 Encounter for therapeutic drug level monitoring: Secondary | ICD-10-CM | POA: Insufficient documentation

## 2022-05-06 DIAGNOSIS — M5116 Intervertebral disc disorders with radiculopathy, lumbar region: Secondary | ICD-10-CM | POA: Insufficient documentation

## 2022-05-06 DIAGNOSIS — M5416 Radiculopathy, lumbar region: Secondary | ICD-10-CM | POA: Insufficient documentation

## 2022-05-06 DIAGNOSIS — G894 Chronic pain syndrome: Secondary | ICD-10-CM | POA: Insufficient documentation

## 2022-05-06 DIAGNOSIS — Z79891 Long term (current) use of opiate analgesic: Secondary | ICD-10-CM | POA: Insufficient documentation

## 2022-05-06 MED ORDER — MORPHINE SULFATE ER 30 MG PO TBCR: 30.0000 mg | EXTENDED_RELEASE_TABLET | Freq: Two times a day (BID) | ORAL | 0 refills | Status: DC

## 2022-05-06 MED ORDER — OXYCODONE-ACETAMINOPHEN 7.5-325 MG PO TABS: 1.0000 | ORAL_TABLET | Freq: Three times a day (TID) | ORAL | 0 refills | Status: DC | PRN

## 2022-05-06 NOTE — Progress Notes (Unsigned)
Subjective:    Patient ID: Joshua [name], male    DOB: 03-Apr-1975, 47 y.o.   MRN: [id number]  HPI: Joshua [name] is a 47 y.o. male who returns for follow up appointment for chronic pain and medication refill. states *** pain is located in  ***. rates pain ***. current exercise regime is walking and performing stretching exercises.  [name] Morphine equivalent is *** MME.   UDS was Ordered today.      Pain Inventory Average Pain 5 Pain Right Now 5 My pain is constant, sharp, stabbing, and aching  In the last 24 hours, has pain interfered with the following? General activity 3 Relation with others 4 Enjoyment of life 4 What TIME of day is your pain at its worst? daytime and evening Sleep (in general) Fair  Pain is worse with: walking, bending, sitting, and standing Pain improves with: rest, therapy/exercise, and medication Relief from Meds: 4  Family History  Problem Relation Age of Onset   Hypertension Mother    Diabetes Father    Social History   Socioeconomic History   Marital status: Single    Spouse name: Not on file   Number of children: Not on file   Years of education: Not on file   Highest education level: Not on file  Occupational History   Occupation: Dump Administrator  Tobacco Use   Smoking status: Every Day    Packs/day: 1.00    Years: 25.00    Total pack years: 25.00    Types: Cigarettes    Last attempt to quit: 10/23/2013    Years since quitting: 8.5   Smokeless tobacco: Former   Tobacco comments:    .5ppd cigarettes-11/07/20  Vaping Use   Vaping Use: Never used  Substance and Sexual Activity   Alcohol use: No    Alcohol/week: 0.0 standard drinks of alcohol   Drug use: No   Sexual activity: Yes    Birth control/protection: Condom  Other Topics Concern   Not on file  Social History Narrative   Not on file   Social Determinants of Health   Financial Resource Strain: Not on file  Food Insecurity: Not on file  Transportation  Needs: Not on file  Physical Activity: Not on file  Stress: Not on file  Social Connections: Not on file   Past Surgical History:  Procedure Laterality Date   KNEE SURGERY Left 2010   Past Surgical History:  Procedure Laterality Date   KNEE SURGERY Left 2010   Past Medical History:  Diagnosis Date   Disorders of sacrum    Displacement of lumbar intervertebral disc without myelopathy    Facet syndrome, lumbar    Lumbago    Thoracic or lumbosacral neuritis or radiculitis, unspecified    BP 135/86   Pulse 66   Ht '5\' 8"'$  (1.727 m)   Wt 259 lb (117.5 kg)   SpO2 97%   BMI 39.38 kg/m   Opioid Risk Score:   Fall Risk Score:  `1  Depression screen [facility] 2/9     05/06/2022    8:10 AM 03/06/2022    8:56 AM 01/03/2022    9:25 AM 11/09/2021    8:39 AM 09/10/2021    9:07 AM 07/13/2021    9:18 AM 05/11/2021    8:18 AM  Depression screen PHQ 2/9  Decreased Interest 0 0 0 0 0 0 0  Down, Depressed, Hopeless 0 0 0 0 0 0 0  PHQ - 2 Score 0  0 0 0 0 0 0    Review of Systems  Musculoskeletal:  Positive for back pain.       Left leg pain  All other systems reviewed and are negative.      Objective:   Physical Exam        Assessment & Plan:  1. Chronic low back pain/ Lumbar Radiculopathy: Continue with Home Exercise Regime. 03/06/2022 2. Degenerative disk disease lumbar spine: 03/06/2022 Refilled: Morphine ( MS Contin) 30 mg one  tablet every 12 hours #60 and Percocet 7.5/325 mg one tablet every 8 hours prn #90. Second script e-scribe for the following month. 03/06/2022 We will continue the opioid monitoring program, this consists of regular clinic visits, examinations, urine drug screen, pill counts as well as use of [place] Controlled Substance Reporting system. A 12 month History has been reviewed on the [place] on 03/06/2022. 3. Tobacco Abuse: Educated on smoking cessation: He verbalizes understanding. 03/06/2022  4. Morbid  Obesity: Continue with Diabetic Diet and HEP as tolerated. Continue to monitor. 03/06/2022

## 2022-05-07 ENCOUNTER — Encounter: Payer: Self-pay | Admitting: Registered Nurse

## 2022-05-08 LAB — TOXASSURE SELECT,+ANTIDEPR,UR

## 2022-07-01 ENCOUNTER — Encounter: Payer: Self-pay | Attending: Physical Medicine & Rehabilitation | Admitting: Registered Nurse

## 2022-07-01 ENCOUNTER — Encounter: Payer: Self-pay | Admitting: Registered Nurse

## 2022-07-01 ENCOUNTER — Telehealth: Payer: Self-pay | Admitting: *Deleted

## 2022-07-01 VITALS — BP 146/86 | HR 64 | Ht 68.0 in | Wt 263.0 lb

## 2022-07-01 DIAGNOSIS — M5116 Intervertebral disc disorders with radiculopathy, lumbar region: Secondary | ICD-10-CM | POA: Insufficient documentation

## 2022-07-01 DIAGNOSIS — G894 Chronic pain syndrome: Secondary | ICD-10-CM | POA: Insufficient documentation

## 2022-07-01 DIAGNOSIS — Z5181 Encounter for therapeutic drug level monitoring: Secondary | ICD-10-CM | POA: Insufficient documentation

## 2022-07-01 DIAGNOSIS — M5416 Radiculopathy, lumbar region: Secondary | ICD-10-CM | POA: Insufficient documentation

## 2022-07-01 DIAGNOSIS — Z79891 Long term (current) use of opiate analgesic: Secondary | ICD-10-CM | POA: Insufficient documentation

## 2022-07-01 MED ORDER — MORPHINE SULFATE ER 30 MG PO TBCR: 30.0000 mg | EXTENDED_RELEASE_TABLET | Freq: Two times a day (BID) | ORAL | 0 refills | Status: DC

## 2022-07-01 MED ORDER — OXYCODONE-ACETAMINOPHEN 7.5-325 MG PO TABS: 1.0000 | ORAL_TABLET | Freq: Three times a day (TID) | ORAL | 0 refills | Status: DC | PRN

## 2022-07-01 NOTE — Progress Notes (Signed)
Subjective:    Patient ID: Joshua [name], male    DOB: 1975-04-10, 47 y.o.   MRN: [id number]  HPI: Joshua [name] is a 47 y.o. male who returns for follow up appointment for chronic pain and medication refill. He states his pain is located in his lower back pain radiating into her left lower extremity. He rates his  pain 4. His current exercise regime is walking and performing stretching exercises.  Mr. Ebel Morphine equivalent is 93.75 MME.   Last UDS was Performed on 05/06/2022, it was consistent.    Pain Inventory Average Pain 4 Pain Right Now 4 My pain is intermittent, constant, sharp, and dull  In the last 24 hours, has pain interfered with the following? General activity 3 Relation with others 3 Enjoyment of life 3 What TIME of day is your pain at its worst? 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  Tobacco Use   Smoking status: Every Day    Packs/day: 1.00    Years: 25.00    Additional pack years: 0.00    Total pack years: 25.00    Types: Cigarettes    Last attempt to quit: 10/23/2013    Years since quitting: 8.6   Smokeless tobacco: Former   Tobacco comments:    .5ppd cigarettes-11/07/20  Vaping Use   Vaping Use: Never used  Substance and Sexual Activity   Alcohol use: No    Alcohol/week: 0.0 standard drinks of alcohol   Drug use: No   Sexual activity: Yes    Birth control/protection: Condom  Other Topics Concern   Not on file  Social History Narrative   Not on  file   Social Determinants of Health   Financial Resource Strain: Not on file  Food Insecurity: Not on file  Transportation Needs: Not on file  Physical Activity: Not on file  Stress: Not on file  Social Connections: Not on file   Past Surgical History:  Procedure Laterality Date   KNEE SURGERY Left 2010   Past Surgical History:  Procedure Laterality Date   KNEE SURGERY Left 2010   Past Medical History:  Diagnosis Date   Disorders of sacrum    Displacement of lumbar intervertebral disc without myelopathy    Facet syndrome, lumbar    Lumbago    Thoracic or lumbosacral neuritis or radiculitis, unspecified    There were no vitals taken for this visit.  Opioid Risk Score:   Fall Risk Score:  `1  Depression screen [facility] 2/9     05/06/2022    8:10 AM 03/06/2022    8:56 AM 01/03/2022    9:25 AM 11/09/2021    8:39 AM 09/10/2021    9:07 AM 07/13/2021    9:18 AM 05/11/2021    8:18 AM  Depression screen PHQ 2/9  Decreased Interest 0 0 0 0 0 0 0  Down, Depressed, Hopeless 0 0 0 0 0 0 0  PHQ - 2 Score  0 0 0 0 0 0 0    Review of Systems  Musculoskeletal:  Positive for back pain.       PAIN ON THE RIGHT BUTTOCK DOWN TO THE RIGHT KNEE  All other systems reviewed and are negative.      Objective:   Physical Exam Vitals and nursing note reviewed.  Constitutional:      Appearance: Normal appearance.  Cardiovascular:     Rate and Rhythm: Normal rate and regular rhythm.     Pulses: Normal pulses.     Heart sounds: Normal heart sounds.  Pulmonary:     Effort: Pulmonary effort is normal.     Breath sounds: Normal breath sounds.  Musculoskeletal:     Cervical back: Normal range of motion and neck supple.     Comments: Normal Muscle Bulk and Muscle Testing Reveals:  Upper Extremities: Full ROM and Muscle Strength 5/5 Lumbar Paraspinal Tenderness : L-4-L-5  Lower Extremities: Full ROM and Muscle Strength 5/5 Arises from Chair with ease Narrow Based  Gait     Skin:    General:  Skin is warm and dry.  Neurological:     Mental Status: He is alert and oriented to person, place, and time.  Psychiatric:        Mood and Affect: Mood normal.        Behavior: Behavior normal.         Assessment & Plan:  1. Chronic low back pain/ Lumbar Radiculopathy: Continue with Home Exercise Regime. 07/01/2022 2. Degenerative disk disease lumbar spine: 07/01/2022 Refilled: Morphine ( MS Contin) 30 mg one  tablet every 12 hours #60 and Percocet 7.5/325 mg one tablet every 8 hours prn #90. Second script e-scribe for the following month. 07/01/2022 We will continue the opioid monitoring program, this consists of regular clinic visits, examinations, urine drug screen, pill counts as well as use of [place] Controlled Substance Reporting system. A 12 month History has been reviewed on the [place] Controlled Substance Reporting System on 07/01/2022. 3. Tobacco Abuse: Educated on smoking cessation: He verbalizes understanding. 07/01/2022  4. Morbid Obesity: Continue with Diabetic Diet and HEP as tolerated. Continue to monitor. 07/01/2022  F/U in 2 months

## 2022-07-01 NOTE — Telephone Encounter (Signed)
Urine drug screen for this encounter is consistent for prescribed medication 

## 2022-08-26 ENCOUNTER — Encounter: Payer: Self-pay | Attending: Physical Medicine & Rehabilitation | Admitting: Registered Nurse

## 2022-08-26 ENCOUNTER — Encounter: Payer: Self-pay | Admitting: Registered Nurse

## 2022-08-26 VITALS — BP 139/88 | HR 62 | Ht 68.0 in | Wt 253.4 lb

## 2022-08-26 DIAGNOSIS — G894 Chronic pain syndrome: Secondary | ICD-10-CM | POA: Insufficient documentation

## 2022-08-26 DIAGNOSIS — M5416 Radiculopathy, lumbar region: Secondary | ICD-10-CM | POA: Insufficient documentation

## 2022-08-26 DIAGNOSIS — M5116 Intervertebral disc disorders with radiculopathy, lumbar region: Secondary | ICD-10-CM | POA: Insufficient documentation

## 2022-08-26 DIAGNOSIS — Z5181 Encounter for therapeutic drug level monitoring: Secondary | ICD-10-CM | POA: Insufficient documentation

## 2022-08-26 DIAGNOSIS — Z79891 Long term (current) use of opiate analgesic: Secondary | ICD-10-CM | POA: Insufficient documentation

## 2022-08-26 MED ORDER — OXYCODONE-ACETAMINOPHEN 7.5-325 MG PO TABS: 1.0000 | ORAL_TABLET | Freq: Three times a day (TID) | ORAL | 0 refills | Status: DC | PRN

## 2022-08-26 MED ORDER — MORPHINE SULFATE ER 30 MG PO TBCR: 30.0000 mg | EXTENDED_RELEASE_TABLET | Freq: Two times a day (BID) | ORAL | 0 refills | Status: DC

## 2022-08-26 NOTE — Progress Notes (Signed)
Subjective:    Patient ID: Joshua [name], male    DOB: 08-10-1975, 47 y.o.   MRN: [id number]  HPI: Joshua [name] is a 47 y.o. male who returns for follow up appointment for chronic pain and medication refill. He states his pain is located in his lower back and occasionally radiating into his left lower extremity. He rates his pain 5. His current exercise regime is walking and performing stretching exercises.  Mr. Baab Morphine equivalent is 93.75 MME.   Last UDS was Performed on 05/06/2022, it was consistent.     Pain Inventory Average Pain 4 Pain Right Now 5 My pain is sharp, stabbing, and aching  In the last 24 hours, has pain interfered with the following? General activity 4 Relation with others 4 Enjoyment of life 4 What TIME of day is your pain at its worst? daytime and 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  Tobacco Use   Smoking status: Every Day    Packs/day: 1.00    Years: 25.00    Additional pack years: 0.00    Total pack years: 25.00    Types: Cigarettes    Last attempt to quit: 10/23/2013    Years since quitting: 8.8   Smokeless tobacco: Former   Tobacco comments:    .5ppd cigarettes-11/07/20  Vaping Use   Vaping Use: Never used  Substance and Sexual Activity   Alcohol use: No    Alcohol/week: 0.0 standard drinks of alcohol   Drug use: No   Sexual activity: Yes    Birth control/protection: Condom  Other Topics Concern   Not on file  Social History Narrative   Not  on file   Social Determinants of Health   Financial Resource Strain: Not on file  Food Insecurity: Not on file  Transportation Needs: Not on file  Physical Activity: Not on file  Stress: Not on file  Social Connections: Not on file   Past Surgical History:  Procedure Laterality Date   KNEE SURGERY Left 2010   Past Surgical History:  Procedure Laterality Date   KNEE SURGERY Left 2010   Past Medical History:  Diagnosis Date   Disorders of sacrum    Displacement of lumbar intervertebral disc without myelopathy    Facet syndrome, lumbar    Lumbago    Thoracic or lumbosacral neuritis or radiculitis, unspecified    BP 139/88   Pulse 62   Ht 5\' 8"  (1.727 m)   Wt 253 lb 6.4 oz (114.9 kg)   SpO2 98%   BMI 38.53 kg/m   Opioid Risk Score:   Fall Risk Score:  `1  Depression screen [facility] 2/9     08/26/2022    8:48 AM 07/01/2022    8:16 AM 05/06/2022    8:10 AM 03/06/2022    8:56 AM 01/03/2022    9:25 AM 11/09/2021    8:39 AM 09/10/2021    9:07 AM  Depression screen PHQ 2/9  Decreased  Interest 0 0 0 0 0 0 0  Down, Depressed, Hopeless 0 0 0 0 0 0 0  PHQ - 2 Score 0 0 0 0 0 0 0     Review of Systems  Constitutional: Negative.   HENT: Negative.    Eyes: Negative.   Respiratory: Negative.    Cardiovascular: Negative.   Gastrointestinal: Negative.   Endocrine: Negative.   Genitourinary: Negative.   Musculoskeletal:  Positive for back pain.  Skin: Negative.   Allergic/Immunologic: Negative.   Neurological: Negative.   Hematological: Negative.   Psychiatric/Behavioral: Negative.    All other systems reviewed and are negative.      Objective:   Physical Exam Vitals and nursing note reviewed.  Constitutional:      Appearance: Normal appearance.  Cardiovascular:     Rate and Rhythm: Normal rate and regular rhythm.     Pulses: Normal pulses.     Heart sounds: Normal heart sounds.  Pulmonary:     Effort: Pulmonary effort is normal.     Breath sounds: Normal breath  sounds.  Musculoskeletal:     Cervical back: Normal range of motion and neck supple.     Comments: Normal Muscle Bulk and Muscle Testing Reveals:  Upper Extremities: Full ROM and Muscle Strength 5/5 Lumbar Paraspinal Tenderness: L-4-L-5 Lower Extremities: Full ROM and Muscle Strength 5/5 Arises from Chair with ease Narrow Based  Gait     Skin:    General: Skin is warm and dry.  Neurological:     Mental Status: He is alert and oriented to person, place, and time.  Psychiatric:        Mood and Affect: Mood normal.        Behavior: Behavior normal.         Assessment & Plan:  1. Chronic low back pain/ Lumbar Radiculopathy: Continue with Home Exercise Regime. 08/26/2022 2. Degenerative disk disease lumbar spine: 08/26/2022 Refilled: Morphine ( MS Contin) 30 mg one  tablet every 12 hours #60 and Percocet 7.5/325 mg one tablet every 8 hours prn #90. Second script e-scribe for the following month. 08/26/2022 We will continue the opioid monitoring program, this consists of regular clinic visits, examinations, urine drug screen, pill counts as well as use of [place] Controlled Substance Reporting system. A 12 month History has been reviewed on the [place] Controlled Substance Reporting System on 08/26/2022. 3. Tobacco Abuse: Educated on smoking cessation: He verbalizes understanding. 08/26/2022  4. Morbid Obesity: Continue with Diabetic Diet and HEP as tolerated. Continue to monitor. 08/26/2022   F/U in 2 months

## 2022-09-05 ENCOUNTER — Ambulatory Visit: Payer: Self-pay | Admitting: Registered Nurse

## 2022-09-26 ENCOUNTER — Other Ambulatory Visit: Payer: Self-pay

## 2022-09-26 DIAGNOSIS — M5116 Intervertebral disc disorders with radiculopathy, lumbar region: Secondary | ICD-10-CM

## 2022-09-26 NOTE — Telephone Encounter (Signed)
Patient called need refill on Morphine and Oxycodone/APAP. Called pharmacy and they state no prescription on file. Patient next appt 10/28/22.

## 2022-09-27 ENCOUNTER — Telehealth: Payer: Self-pay | Admitting: *Deleted

## 2022-09-27 MED ORDER — MORPHINE SULFATE ER 30 MG PO TBCR
30.0000 mg | EXTENDED_RELEASE_TABLET | Freq: Two times a day (BID) | ORAL | 0 refills | Status: DC
Start: 2022-09-27 — End: 2022-10-28

## 2022-09-27 MED ORDER — OXYCODONE-ACETAMINOPHEN 7.5-325 MG PO TABS: 1.0000 | ORAL_TABLET | Freq: Three times a day (TID) | ORAL | 0 refills | Status: DC | PRN

## 2022-09-27 NOTE — Telephone Encounter (Signed)
Patient requesting refill Morphine and Oxycodone refill.

## 2022-10-28 ENCOUNTER — Encounter: Payer: Self-pay | Admitting: Registered Nurse

## 2022-10-28 ENCOUNTER — Encounter: Payer: Self-pay | Attending: Physical Medicine & Rehabilitation | Admitting: Registered Nurse

## 2022-10-28 VITALS — BP 137/88 | HR 67 | Ht 68.0 in | Wt 258.0 lb

## 2022-10-28 DIAGNOSIS — G894 Chronic pain syndrome: Secondary | ICD-10-CM

## 2022-10-28 DIAGNOSIS — M5116 Intervertebral disc disorders with radiculopathy, lumbar region: Secondary | ICD-10-CM

## 2022-10-28 DIAGNOSIS — Z5181 Encounter for therapeutic drug level monitoring: Secondary | ICD-10-CM

## 2022-10-28 DIAGNOSIS — Z79891 Long term (current) use of opiate analgesic: Secondary | ICD-10-CM

## 2022-10-28 DIAGNOSIS — M5416 Radiculopathy, lumbar region: Secondary | ICD-10-CM

## 2022-10-28 MED ORDER — OXYCODONE-ACETAMINOPHEN 7.5-325 MG PO TABS
1.0000 | ORAL_TABLET | Freq: Three times a day (TID) | ORAL | 0 refills | Status: DC | PRN
Start: 2022-10-28 — End: 2022-10-28

## 2022-10-28 MED ORDER — OXYCODONE-ACETAMINOPHEN 7.5-325 MG PO TABS
1.0000 | ORAL_TABLET | Freq: Three times a day (TID) | ORAL | 0 refills | Status: DC | PRN
Start: 2022-10-28 — End: 2022-12-20

## 2022-10-28 MED ORDER — MORPHINE SULFATE ER 30 MG PO TBCR
30.0000 mg | EXTENDED_RELEASE_TABLET | Freq: Two times a day (BID) | ORAL | 0 refills | Status: DC
Start: 2022-10-28 — End: 2022-10-28

## 2022-10-28 MED ORDER — MORPHINE SULFATE ER 30 MG PO TBCR
30.0000 mg | EXTENDED_RELEASE_TABLET | Freq: Two times a day (BID) | ORAL | 0 refills | Status: DC
Start: 2022-10-28 — End: 2022-12-20

## 2022-10-28 NOTE — Progress Notes (Signed)
Subjective:    Patient ID: Joshua [name], male    DOB: 22-Jul-1975, 47 y.o.   MRN: [id number]  HPI: Joshua [name] is a 47 y.o. male who returns for follow up appointment for chronic pain and medication refill. He states his pain is located in his lower back radiating occasionally in his left lower extremity. He rates his pain 4. His current exercise regime is walking and performing stretching exercises.  Joshua [name] Morphine equivalent is 87.50 MME.   UDS ordered today     Pain Inventory Average Pain 5 Pain Right Now 4 My pain is constant, burning, stabbing, and aching  In the last 24 hours, has pain interfered with the following? General activity 3 Relation with others 2 Enjoyment of life 2 What TIME of day is your pain at its worst? 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  Tobacco Use   Smoking status: Every Day    Current packs/day: 0.00    Average packs/day: 1 pack/day for 25.0 years (25.0 ttl pk-yrs)    Types: Cigarettes    Start date: 10/23/1988    Last attempt to quit: 10/23/2013    Years since quitting: 9.0   Smokeless tobacco: Former   Tobacco comments:    .5ppd cigarettes-11/07/20  Vaping Use   Vaping status: Never Used  Substance and Sexual Activity   Alcohol use: No    Alcohol/week: 0.0 standard drinks of alcohol   Drug use: No   Sexual activity: Yes    Birth control/protection: Condom  Other Topics Concern   Not on file  Social History Narrative   Not on file    Social Determinants of Health   Financial Resource Strain: Not on file  Food Insecurity: Not on file  Transportation Needs: Not on file  Physical Activity: Not on file  Stress: Not on file  Social Connections: Unknown (07/09/2021)   Received from [facility]   Social Network    Social Network: Not on file   Past Surgical History:  Procedure Laterality Date   KNEE SURGERY Left 2010   Past Surgical History:  Procedure Laterality Date   KNEE SURGERY Left 2010   Past Medical History:  Diagnosis Date   Disorders of sacrum    Displacement of lumbar intervertebral disc without myelopathy    Facet syndrome, lumbar    Lumbago    Thoracic or lumbosacral neuritis or radiculitis, unspecified    BP 137/88   Pulse 67   Ht 5\' 8"  (1.727 m)   Wt 258 lb (117 kg)   SpO2 98%   BMI 39.23 kg/m   Opioid Risk Score:   Fall Risk Score:  `1  Depression screen [facility] 2/9     08/26/2022    8:48 AM 07/01/2022    8:16 AM 05/06/2022    8:10 AM 03/06/2022    8:56 AM 01/03/2022    9:25 AM 11/09/2021    8:39 AM 09/10/2021  9:07 AM  Depression screen PHQ 2/9  Decreased Interest 0 0 0 0 0 0 0  Down, Depressed, Hopeless 0 0 0 0 0 0 0  PHQ - 2 Score 0 0 0 0 0 0 0     Review of Systems  Musculoskeletal:  Positive for back pain.       LT leg pain  All other systems reviewed and are negative.      Objective:   Physical Exam Vitals and nursing note reviewed.  Constitutional:      Appearance: Normal appearance. He is obese.  Cardiovascular:     Rate and Rhythm: Normal rate and regular rhythm.     Pulses: Normal pulses.     Heart sounds: Normal heart sounds.  Pulmonary:     Effort: Pulmonary effort is normal.     Breath sounds: Normal breath sounds.  Musculoskeletal:     Cervical back: Normal range of motion and neck supple.     Comments: Normal Muscle Bulk and Muscle Testing Reveals:  Upper Extremities: Full ROM and Muscle Strength 5/5 Lumbar Paraspinal Tenderness: L-4-L-5 Lower  Extremities: Full ROM and Muscle Strength 5/5 Arises from Chair with Ease Narrow Based  Gait     Skin:    General: Skin is warm and dry.  Neurological:     Mental Status: He is alert and oriented to person, place, and time.  Psychiatric:        Mood and Affect: Mood normal.        Behavior: Behavior normal.           Assessment & Plan:  1. Chronic low back pain/ Lumbar Radiculopathy: Continue with Home Exercise Regime. 10/28/2022 2. Degenerative disk disease lumbar spine: 10/28/2022 Refilled: Morphine ( MS Contin) 30 mg one  tablet every 12 hours #60 and Percocet 7.5/325 mg one tablet every 8 hours prn #90. Second script e-scribe for the following month. 10/28/2022 We will continue the opioid monitoring program, this consists of regular clinic visits, examinations, urine drug screen, pill counts as well as use of [place] Controlled Substance Reporting system. A 12 month History has been reviewed on the [place] Controlled Substance Reporting System on 10/28/2022. 3. Tobacco Abuse: Educated on smoking cessation: He verbalizes understanding. 10/28/2022  4. Morbid Obesity: Continue with Diabetic Diet and HEP as tolerated. Continue to monitor. 10/28/2022   F/U in 2 months

## 2022-10-31 LAB — DRUG TOX MONITOR 1 W/CONF, ORAL FLD
Amphetamines: NEGATIVE ng/mL (ref <10)
Barbiturates: NEGATIVE ng/mL (ref <10)
Benzodiazepines: NEGATIVE ng/mL (ref <0.50)
Buprenorphine: NEGATIVE ng/mL (ref <0.10)
Cocaine: NEGATIVE ng/mL (ref <5.0)
Codeine: NEGATIVE ng/mL (ref <2.5)
Cotinine: >250 ng/mL — ABNORMAL HIGH (ref <5.0)
Dihydrocodeine: NEGATIVE ng/mL (ref <2.5)
Fentanyl: NEGATIVE ng/mL (ref <0.10)
Heroin Metabolite: NEGATIVE ng/mL (ref <1.0)
Hydrocodone: NEGATIVE ng/mL (ref <2.5)
Hydromorphone: NEGATIVE ng/mL (ref <2.5)
MARIJUANA: NEGATIVE ng/mL (ref <2.5)
MDMA: NEGATIVE ng/mL (ref <10)
Meprobamate: NEGATIVE ng/mL (ref <2.5)
Methadone: NEGATIVE ng/mL (ref <5.0)
Morphine: 3.6 ng/mL — ABNORMAL HIGH (ref <2.5)
Nicotine Metabolite: POSITIVE ng/mL — AB (ref <5.0)
Norhydrocodone: NEGATIVE ng/mL (ref <2.5)
Noroxycodone: 24.1 ng/mL — ABNORMAL HIGH (ref <2.5)
Opiates: POSITIVE ng/mL — AB (ref <2.5)
Oxycodone: 109.6 ng/mL — ABNORMAL HIGH (ref <2.5)
Oxymorphone: NEGATIVE ng/mL (ref <2.5)
Phencyclidine: NEGATIVE ng/mL (ref <10)
Tapentadol: NEGATIVE ng/mL (ref <5.0)
Tramadol: NEGATIVE ng/mL (ref <5.0)
Zolpidem: NEGATIVE ng/mL (ref <5.0)

## 2022-10-31 LAB — DRUG TOX ALC METAB W/CON, ORAL FLD: Alcohol Metabolite: NEGATIVE ng/mL (ref <25)

## 2022-12-19 NOTE — Progress Notes (Deleted)
   Subjective:    Patient ID: Joshua [name], male    DOB: 01-25-1976, 47 y.o.   MRN: [id number]  HPI Pain Inventory Average Pain {NUMBERS; 0-10:5044} Pain Right Now {NUMBERS; 0-10:5044} My pain is {PAIN DESCRIPTION:21022940}  In the last 24 hours, has pain interfered with the following? General activity {NUMBERS; 0-10:5044} Relation with others {NUMBERS; 0-10:5044} Enjoyment of life {NUMBERS; 0-10:5044} What TIME of day is your pain at its worst? {time of day:24191} Sleep (in general) {BHH GOOD/FAIR/POOR:22877}  Pain is worse with: {ACTIVITIES:21022942} Pain improves with: {PAIN IMPROVES IHKV:42595638} Relief from Meds: {NUMBERS; 0-10:5044}  Family History  Problem Relation Age of Onset   Hypertension Mother    Diabetes Father    Social History   Socioeconomic History   Marital status: Single    Spouse name: Not on file   Number of children: Not on file   Years of education: Not on file   Highest education level: Not on file  Occupational History   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  Tobacco Use   Smoking status: Every Day    Current packs/day: 0.00    Average packs/day: 1 pack/day for 25.0 years (25.0 ttl pk-yrs)    Types: Cigarettes    Start date: 10/23/1988    Last attempt to quit: 10/23/2013    Years since quitting: 9.1   Smokeless tobacco: Former   Tobacco comments:    .5ppd cigarettes-11/07/20  Vaping Use   Vaping status: Never Used  Substance and Sexual Activity   Alcohol use: No    Alcohol/week: 0.0 standard drinks of alcohol   Drug use: No   Sexual activity: Yes    Birth control/protection: Condom  Other Topics Concern   Not on file  Social History Narrative   Not on file   Social Determinants of Health   Financial Resource Strain: Not on file  Food Insecurity: Not on file  Transportation Needs: Not on file  Physical Activity: Not on file  Stress: Not on file  Social Connections: Unknown (07/09/2021)   Received from [facility]    Social Network    Social Network: Not on file   Past Surgical History:  Procedure Laterality Date   KNEE SURGERY Left 2010   Past Surgical History:  Procedure Laterality Date   KNEE SURGERY Left 2010   Past Medical History:  Diagnosis Date   Disorders of sacrum    Displacement of lumbar intervertebral disc without myelopathy    Facet syndrome, lumbar    Lumbago    Thoracic or lumbosacral neuritis or radiculitis, unspecified    There were no vitals taken for this visit.  Opioid Risk Score:   Fall Risk Score:  `1  Depression screen [facility] 2/9     10/28/2022    8:48 AM 08/26/2022    8:48 AM 07/01/2022    8:16 AM 05/06/2022    8:10 AM 03/06/2022    8:56 AM 01/03/2022    9:25 AM 11/09/2021    8:39 AM  Depression screen PHQ 2/9  Decreased Interest 0 0 0 0 0 0 0  Down, Depressed, Hopeless 0 0 0 0 0 0 0  PHQ - 2 Score 0 0 0 0 0 0 0      Review of Systems     Objective:   Physical Exam        Assessment & Plan:

## 2022-12-20 ENCOUNTER — Encounter: Payer: Self-pay | Admitting: Registered Nurse

## 2022-12-20 ENCOUNTER — Encounter: Payer: Self-pay | Attending: Physical Medicine & Rehabilitation | Admitting: Registered Nurse

## 2022-12-20 VITALS — BP 139/84 | HR 64 | Wt 261.8 lb

## 2022-12-20 DIAGNOSIS — Z5181 Encounter for therapeutic drug level monitoring: Secondary | ICD-10-CM

## 2022-12-20 DIAGNOSIS — M5416 Radiculopathy, lumbar region: Secondary | ICD-10-CM

## 2022-12-20 DIAGNOSIS — M5116 Intervertebral disc disorders with radiculopathy, lumbar region: Secondary | ICD-10-CM

## 2022-12-20 DIAGNOSIS — Z79891 Long term (current) use of opiate analgesic: Secondary | ICD-10-CM

## 2022-12-20 DIAGNOSIS — G894 Chronic pain syndrome: Secondary | ICD-10-CM

## 2022-12-20 MED ORDER — OXYCODONE-ACETAMINOPHEN 7.5-325 MG PO TABS: 1.0000 | ORAL_TABLET | Freq: Three times a day (TID) | ORAL | 0 refills | Status: DC | PRN

## 2022-12-20 MED ORDER — MORPHINE SULFATE ER 30 MG PO TBCR
30.0000 mg | EXTENDED_RELEASE_TABLET | Freq: Two times a day (BID) | ORAL | 0 refills | Status: DC
Start: 2022-12-20 — End: 2022-12-20

## 2022-12-20 MED ORDER — OXYCODONE-ACETAMINOPHEN 7.5-325 MG PO TABS
1.0000 | ORAL_TABLET | Freq: Three times a day (TID) | ORAL | 0 refills | Status: DC | PRN
Start: 2022-12-20 — End: 2022-12-20

## 2022-12-20 MED ORDER — MORPHINE SULFATE ER 30 MG PO TBCR
30.0000 mg | EXTENDED_RELEASE_TABLET | Freq: Two times a day (BID) | ORAL | 0 refills | Status: DC
Start: 2022-12-20 — End: 2023-02-21

## 2022-12-20 NOTE — Progress Notes (Signed)
Subjective:    Patient ID: Joshua [name], male    DOB: 07-03-1975, 48 y.o.   MRN: [id number]  HPI: Joshua [name] is a 47 y.o. male who returns for follow up appointment for chronic pain and medication refill. He states his pain is located in his lower back  and occasionally radiating into his left lower extremity. He  rates his pain 5. His current exercise regime is walking and performing stretching exercises.  [name] Morphine equivalent is 93.75 MME.   Last Oral Swab was Performed on 10/28/2022, it was consistent.     Pain Inventory Average Pain 4 Pain Right Now 5 My pain is sharp, stabbing, and aching  In the last 24 hours, has pain interfered with the following? General activity 4 Relation with others 3 Enjoyment of life 3 What TIME of day is your pain at its worst? daytime and evening Sleep (in general) Fair  Pain is worse with: walking, bending, sitting, and standing Pain improves with: rest, therapy/exercise, and medication Relief from Meds: 5  Family History  Problem Relation Age of Onset   Hypertension Mother    Diabetes Father    Social History   Socioeconomic History   Marital status: Single    Spouse name: Not on file   Number of children: Not on file   Years of education: Not on file   Highest education level: Not on file  Occupational History   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  Tobacco Use   Smoking status: Every Day    Current packs/day: 0.00    Average packs/day: 1 pack/day for 25.0 years (25.0 ttl pk-yrs)    Types: Cigarettes    Start date: 10/23/1988    Last attempt to quit: 10/23/2013    Years since quitting: 9.1   Smokeless tobacco: Former   Tobacco comments:    .5ppd cigarettes-11/07/20  Vaping Use   Vaping status: Never Used  Substance and Sexual Activity   Alcohol use: No    Alcohol/week: 0.0 standard drinks of alcohol   Drug use: No   Sexual activity: Yes    Birth control/protection: Condom  Other Topics Concern   Not on file   Social History Narrative   Not on file   Social Determinants of Health   Financial Resource Strain: Not on file  Food Insecurity: Not on file  Transportation Needs: Not on file  Physical Activity: Not on file  Stress: Not on file  Social Connections: Unknown (07/09/2021)   Received from [facility]   Social Network    Social Network: Not on file   Past Surgical History:  Procedure Laterality Date   KNEE SURGERY Left 2010   Past Surgical History:  Procedure Laterality Date   KNEE SURGERY Left 2010   Past Medical History:  Diagnosis Date   Disorders of sacrum    Displacement of lumbar intervertebral disc without myelopathy    Facet syndrome, lumbar    Lumbago    Thoracic or lumbosacral neuritis or radiculitis, unspecified    There were no vitals taken for this visit.  Opioid Risk Score:   Fall Risk Score:  `1  Depression screen [facility] 2/9     10/28/2022    8:48 AM 08/26/2022    8:48 AM 07/01/2022    8:16 AM 05/06/2022    8:10 AM 03/06/2022    8:56 AM 01/03/2022    9:25 AM 11/09/2021    8:39 AM  Depression screen PHQ 2/9  Decreased Interest  0 0 0 0 0 0 0  Down, Depressed, Hopeless 0 0 0 0 0 0 0  PHQ - 2 Score 0 0 0 0 0 0 0     Review of Systems  Musculoskeletal:  Positive for back pain and gait problem.       Objective:   Physical Exam Vitals and nursing note reviewed.  Constitutional:      Appearance: Normal appearance. He is obese.  Cardiovascular:     Rate and Rhythm: Normal rate and regular rhythm.     Pulses: Normal pulses.     Heart sounds: Normal heart sounds.  Pulmonary:     Effort: Pulmonary effort is normal.     Breath sounds: Normal breath sounds.  Musculoskeletal:     Cervical back: Normal range of motion and neck supple.     Comments: Normal Muscle Bulk and Muscle Testing Reveals:  Upper Extremities: Full ROM and Muscle Strength 5/5  Lumbar Paraspinal Tenderness: L-4-L-5 Lower Extremities: Full ROM and Muscle Strength  5/5 Arises from Chair with ease Narrow Based  Gait     Skin:    General: Skin is warm and dry.  Neurological:     Mental Status: He is alert and oriented to person, place, and time.  Psychiatric:        Mood and Affect: Mood normal.        Behavior: Behavior normal.          Assessment & Plan:  1. Chronic low back pain/ Lumbar Radiculopathy: Continue with Home Exercise Regime. 12/20/2022 2. Degenerative disk disease lumbar spine: 12/20/2022 Refilled: Morphine ( MS Contin) 30 mg one  tablet every 12 hours #60 and Percocet 7.5/325 mg one tablet every 8 hours prn #90. Second script e-scribe for the following month. 12/20/2022 We will continue the opioid monitoring program, this consists of regular clinic visits, examinations, urine drug screen, pill counts as well as use of [place] Controlled Substance Reporting system. A 12 month History has been reviewed on the [place] Controlled Substance Reporting System on 12/20/2022. 3. Tobacco Abuse: Educated on smoking cessation: He verbalizes understanding. 12/20/2022  4. Morbid Obesity: Continue with Diabetic Diet and HEP as tolerated. Continue to monitor. 12/20/2022   F/U in 2 months

## 2022-12-30 ENCOUNTER — Encounter: Payer: Self-pay | Admitting: Registered Nurse

## 2023-02-20 NOTE — Progress Notes (Signed)
Subjective:    Patient ID: Joshua [name], male    DOB: April 19, 1975, 47 y.o.   MRN: [id number]  HPI: Joshua [name] is a 47 y.o. male who returns for follow up appointment for chronic pain and medication refill. He states his pain is located in his lower back radiating into his left lower extremity occasionally. He rates his pain . current exercise regime is walking and performing stretching exercises.  [name] Morphine equivalent is 93.75 MME.   UDS ordered today.      Pain Inventory Average Pain 4 Pain Right Now 4 My pain is stabbing  In the last 24 hours, has pain interfered with the following? General activity 4 Relation with others 4 Enjoyment of life 3 What TIME of day is your pain at its worst? 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  Tobacco Use   Smoking status: Every Day    Current packs/day: 0.00    Average packs/day: 1 pack/day for 25.0 years (25.0 ttl pk-yrs)    Types: Cigarettes    Start date: 10/23/1988    Last attempt to quit: 10/23/2013    Years since quitting: 9.3   Smokeless tobacco: Former   Tobacco comments:    .5ppd cigarettes-11/07/20  Vaping Use   Vaping status: Never Used  Substance and Sexual Activity   Alcohol use: No    Alcohol/week: 0.0 standard drinks of alcohol   Drug use: No   Sexual activity: Yes    Birth control/protection: Condom  Other Topics Concern   Not on file  Social History Narrative   Not on file   Social Drivers of Health    Financial Resource Strain: Not on file  Food Insecurity: Not on file  Transportation Needs: Not on file  Physical Activity: Not on file  Stress: Not on file  Social Connections: Unknown (07/09/2021)   Received from [facility]   Social Network    Social Network: Not on file   Past Surgical History:  Procedure Laterality Date   KNEE SURGERY Left 2010   Past Surgical History:  Procedure Laterality Date   KNEE SURGERY Left 2010   Past Medical History:  Diagnosis Date   Disorders of sacrum    Displacement of lumbar intervertebral disc without myelopathy    Facet syndrome, lumbar    Lumbago    Thoracic or lumbosacral neuritis or radiculitis, unspecified    There were no vitals taken for this visit.  Opioid Risk Score:   Fall Risk Score:  `1  Depression screen [facility] 2/9     12/20/2022    8:57 AM 10/28/2022    8:48 AM 08/26/2022    8:48 AM 07/01/2022    8:16 AM 05/06/2022    8:10 AM 03/06/2022    8:56 AM 01/03/2022    9:25 AM  Depression screen [facility] 2/9  Decreased Interest 0 0 0 0 0 0 0  Down, Depressed, Hopeless 0 0  0 0 0 0 0  PHQ - 2 Score 0 0 0 0 0 0 0    Review of Systems  Musculoskeletal:  Positive for back pain and gait problem.  All other systems reviewed and are negative.     Objective:   Physical Exam Vitals and nursing note reviewed.  Constitutional:      Appearance: Normal appearance.  Cardiovascular:     Rate and Rhythm: Normal rate and regular rhythm.     Pulses: Normal pulses.     Heart sounds: Normal heart sounds.  Pulmonary:     Effort: Pulmonary effort is normal.     Breath sounds: Normal breath sounds.  Musculoskeletal:     Comments: Normal Muscle Bulk and Muscle Testing Reveals:  Upper Extremities: Full ROM and Muscle Strength 5/5  Lumbar Paraspinal Tenderness: L-4-L-5 Lower Extremities: Full ROM and Muscle Strength 5/5 Arises from Chair with ease Narrow Based  Gait     Skin:    General: Skin is warm and dry.   Neurological:     Mental Status: He is alert and oriented to person, place, and time.  Psychiatric:        Mood and Affect: Mood normal.        Behavior: Behavior normal.         Assessment & Plan:  1. Chronic low back pain/ Lumbar Radiculopathy: Continue with Home Exercise Regime. 02/21/2023 2. Degenerative disk disease lumbar spine: 02/21/2023 Refilled: Morphine ( MS Contin) 30 mg one  tablet every 12 hours #60 and Percocet 7.5/325 mg one tablet every 8 hours prn #90. Second script e-scribe for the following month. 02/21/2023 We will continue the opioid monitoring program, this consists of regular clinic visits, examinations, urine drug screen, pill counts as well as use of [place] Controlled Substance Reporting system. A 12 month History has been reviewed on the [place] Controlled Substance Reporting System on 02/21/2023. 3. Tobacco Abuse: Educated on smoking cessation: He verbalizes understanding. 02/21/2023  4. Morbid Obesity: Continue with Diabetic Diet and HEP as tolerated. Continue to monitor. 02/21/2023   F/U in 2 months

## 2023-02-21 ENCOUNTER — Encounter: Payer: Self-pay | Attending: Physical Medicine & Rehabilitation | Admitting: Registered Nurse

## 2023-02-21 ENCOUNTER — Encounter: Payer: Self-pay | Admitting: Registered Nurse

## 2023-02-21 VITALS — BP 135/80 | HR 67 | Ht 68.0 in | Wt 263.0 lb

## 2023-02-21 DIAGNOSIS — Z5181 Encounter for therapeutic drug level monitoring: Secondary | ICD-10-CM | POA: Insufficient documentation

## 2023-02-21 DIAGNOSIS — Z79891 Long term (current) use of opiate analgesic: Secondary | ICD-10-CM | POA: Insufficient documentation

## 2023-02-21 DIAGNOSIS — G894 Chronic pain syndrome: Secondary | ICD-10-CM | POA: Insufficient documentation

## 2023-02-21 DIAGNOSIS — M5116 Intervertebral disc disorders with radiculopathy, lumbar region: Secondary | ICD-10-CM | POA: Insufficient documentation

## 2023-02-21 DIAGNOSIS — M5416 Radiculopathy, lumbar region: Secondary | ICD-10-CM | POA: Insufficient documentation

## 2023-02-21 MED ORDER — MORPHINE SULFATE ER 30 MG PO TBCR: 30.0000 mg | EXTENDED_RELEASE_TABLET | Freq: Two times a day (BID) | ORAL | 0 refills | Status: DC

## 2023-02-21 MED ORDER — OXYCODONE-ACETAMINOPHEN 7.5-325 MG PO TABS: 1.0000 | ORAL_TABLET | Freq: Three times a day (TID) | ORAL | 0 refills | Status: DC | PRN

## 2023-02-21 NOTE — Addendum Note (Signed)
Addended by: Sydnee Cabal D on: 02/21/2023 09:18 AM   Modules accepted: Orders

## 2023-02-25 LAB — DRUG TOX MONITOR 1 W/CONF, ORAL FLD
Amphetamines: NEGATIVE ng/mL (ref <10)
Barbiturates: NEGATIVE ng/mL (ref <10)
Benzodiazepines: NEGATIVE ng/mL (ref <0.50)
Buprenorphine: NEGATIVE ng/mL (ref <0.10)
Cocaine: NEGATIVE ng/mL (ref <5.0)
Codeine: NEGATIVE ng/mL (ref <2.5)
Cotinine: >250 ng/mL — ABNORMAL HIGH (ref <5.0)
Dihydrocodeine: NEGATIVE ng/mL (ref <2.5)
Fentanyl: NEGATIVE ng/mL (ref <0.10)
Heroin Metabolite: NEGATIVE ng/mL (ref <1.0)
Hydrocodone: NEGATIVE ng/mL (ref <2.5)
Hydromorphone: NEGATIVE ng/mL (ref <2.5)
MARIJUANA: NEGATIVE ng/mL (ref <2.5)
MDMA: NEGATIVE ng/mL (ref <10)
Meprobamate: NEGATIVE ng/mL (ref <2.5)
Methadone: NEGATIVE ng/mL (ref <5.0)
Morphine: 3.2 ng/mL — ABNORMAL HIGH (ref <2.5)
Nicotine Metabolite: POSITIVE ng/mL — AB (ref <5.0)
Norhydrocodone: NEGATIVE ng/mL (ref <2.5)
Noroxycodone: 20.9 ng/mL — ABNORMAL HIGH (ref <2.5)
Opiates: POSITIVE ng/mL — AB (ref <2.5)
Oxycodone: 62.3 ng/mL — ABNORMAL HIGH (ref <2.5)
Oxymorphone: NEGATIVE ng/mL (ref <2.5)
Phencyclidine: NEGATIVE ng/mL (ref <10)
Tapentadol: NEGATIVE ng/mL (ref <5.0)
Tramadol: NEGATIVE ng/mL (ref <5.0)
Zolpidem: NEGATIVE ng/mL (ref <5.0)

## 2023-02-25 LAB — DRUG TOX ALC METAB W/CON, ORAL FLD: Alcohol Metabolite: NEGATIVE ng/mL (ref <25)

## 2023-02-28 ENCOUNTER — Encounter: Payer: Self-pay | Admitting: Registered Nurse

## 2023-04-17 NOTE — Progress Notes (Signed)
 Subjective:    Patient ID: Joshua [name], male    DOB: [birth year], 48 y.o.   MRN: [id number]  HPI: Joshua [name] is a 48 y.o. male who returns for follow up appointment for chronic pain and medication refill. He states his  pain is located in his right shoulder, he denies falling and lower back radiating into his left lower extremity. He rates his pain 4. His current exercise regime is walking and performing stretching exercises.  Mr. Barro Morphine  equivalent is 93.75 MME.   Last Oral Swab was Performed on 02/21/2023, it was consistent.    Pain Inventory Average Pain 4 Pain Right Now 4 My pain is burning, stabbing, and aching  In the last 24 hours, has pain interfered with the following? General activity 3 Relation with others 4 Enjoyment of life 3 What TIME of day is your pain at its worst? daytime and evening Sleep (in general) Fair  Pain is worse with: walking, sitting, and standing Pain improves with: rest, therapy/exercise, and medication Relief from Meds: 4  Family History  Problem Relation Age of Onset   Hypertension Mother    Diabetes Father    Social History   Socioeconomic History   Marital status: Single    Spouse name: Not on file   Number of children: Not on file   Years of education: Not on file   Highest education level: Not on file  Occupational History   Occupation: [employer]  Tobacco Use   Smoking status: Every Day    Current packs/day: 0.00    Average packs/day: 1 pack/day for 25.0 years (25.0 ttl pk-yrs)    Types: Cigarettes    Start date: 10/23/1988    Last attempt to quit: 10/23/2013    Years since quitting: 9.4   Smokeless tobacco: Former   Tobacco comments:    .5ppd cigarettes-11/07/20  Vaping Use   Vaping status: Never Used  Substance and Sexual Activity   Alcohol  use: No    Alcohol /week: 0.0 standard drinks of alcohol    Drug use: No   Sexual activity: Yes    Birth control/protection: Condom  Other Topics Concern    Not on file  Social History Narrative   Not on file   Social Drivers of Health   Financial Resource Strain: Not on file  Food Insecurity: Not on file  Transportation Needs: Not on file  Physical Activity: Not on file  Stress: Not on file  Social Connections: Unknown (07/09/2021)   Received from [facility], [facility]   Social Network    Social Network: Not on file   Past Surgical History:  Procedure Laterality Date   KNEE SURGERY Left 2010   Past Surgical History:  Procedure Laterality Date   KNEE SURGERY Left 2010   Past Medical History:  Diagnosis Date   Disorders of sacrum    Displacement of lumbar intervertebral disc without myelopathy    Facet syndrome, lumbar    Lumbago    Thoracic or lumbosacral neuritis or radiculitis, unspecified    There were no vitals taken for this visit.  Opioid Risk Score:   Fall Risk Score:  `1  Depression screen [facility] 2/9     02/21/2023    8:29 AM 12/20/2022    8:57 AM 10/28/2022    8:48 AM 08/26/2022    8:48 AM 07/01/2022    8:16 AM 05/06/2022    8:10 AM 03/06/2022    8:56 AM  Depression screen PHQ 2/9  Decreased  Interest 0 0 0 0 0 0 0  Down, Depressed, Hopeless 0 0 0 0 0 0 0  PHQ - 2 Score 0 0 0 0 0 0 0    Review of Systems  Musculoskeletal:  Positive for back pain.       Pain in back of left leg  All other systems reviewed and are negative.     Objective:   Physical Exam Vitals and nursing note reviewed.  Constitutional:      Appearance: Normal appearance. He is obese.  Cardiovascular:     Rate and Rhythm: Normal rate and regular rhythm.     Pulses: Normal pulses.     Heart sounds: Normal heart sounds.  Pulmonary:     Effort: Pulmonary effort is normal.     Breath sounds: Normal breath sounds.  Musculoskeletal:     Comments: Normal Muscle Bulk and Muscle Testing Reveals:  Upper Extremities: Full ROM and Muscle Strength 5/5 Right AC Joint Tenderness  Lumbar Paraspinal Tenderness: L-3-L-5 Lower Extremities:  Full ROM and Muscle Strength 5/5 Arises from Chair with ease Narrow Based  Gait     Skin:    General: Skin is warm and dry.  Neurological:     Mental Status: He is alert and oriented to person, place, and time.  Psychiatric:        Mood and Affect: Mood normal.        Behavior: Behavior normal.         Assessment & Plan:  1. Chronic low back pain/ Lumbar Radiculopathy: Continue with Home Exercise Regime. 04/18/2023 2. Degenerative disk disease lumbar spine: 02/21/2023 Refilled: Morphine  ( MS Contin ) 30 mg one  tablet every 12 hours #60 and Percocet 7.5/325 mg one tablet every 8 hours prn #90. Second script e-scribe for the following month. 04/18/2023 We will continue the opioid monitoring program, this consists of regular clinic visits, examinations, urine drug screen, pill counts as well as use of [place]  Controlled Substance Reporting system. A 12 month History has been reviewed on the [place]  Controlled Substance Reporting System on 04/18/2023. 3. Tobacco Abuse: Educated on smoking cessation: He verbalizes understanding. 04/18/2023  4. Morbid Obesity: Continue with Diabetic Diet and HEP as tolerated. Continue to monitor. 04/18/2023   F/U in 2 months

## 2023-04-18 ENCOUNTER — Encounter: Payer: Self-pay | Admitting: Registered Nurse

## 2023-04-18 ENCOUNTER — Ambulatory Visit: Payer: Self-pay | Admitting: Registered Nurse

## 2023-04-18 ENCOUNTER — Encounter: Payer: Self-pay | Attending: Physical Medicine & Rehabilitation | Admitting: Registered Nurse

## 2023-04-18 VITALS — BP 123/76 | HR 64 | Ht 68.0 in | Wt 264.0 lb

## 2023-04-18 DIAGNOSIS — Z5181 Encounter for therapeutic drug level monitoring: Secondary | ICD-10-CM | POA: Insufficient documentation

## 2023-04-18 DIAGNOSIS — M5416 Radiculopathy, lumbar region: Secondary | ICD-10-CM | POA: Insufficient documentation

## 2023-04-18 DIAGNOSIS — Z79891 Long term (current) use of opiate analgesic: Secondary | ICD-10-CM | POA: Insufficient documentation

## 2023-04-18 DIAGNOSIS — G894 Chronic pain syndrome: Secondary | ICD-10-CM | POA: Insufficient documentation

## 2023-04-18 DIAGNOSIS — M5116 Intervertebral disc disorders with radiculopathy, lumbar region: Secondary | ICD-10-CM | POA: Insufficient documentation

## 2023-04-18 MED ORDER — OXYCODONE-ACETAMINOPHEN 7.5-325 MG PO TABS: 1.0000 | ORAL_TABLET | Freq: Three times a day (TID) | ORAL | 0 refills | Status: DC | PRN

## 2023-04-18 MED ORDER — MORPHINE SULFATE ER 30 MG PO TBCR: 30.0000 mg | EXTENDED_RELEASE_TABLET | Freq: Two times a day (BID) | ORAL | 0 refills | Status: DC

## 2023-06-12 NOTE — Progress Notes (Unsigned)
 Subjective:    Patient ID: Joshua [name], male    DOB: 08-29-1975, 48 y.o.   MRN: [id number]  HPI: Joshua [name] is a 48 y.o. male who returns for follow up appointment for chronic pain and medication refill. He states his pain is located in his neck radiating into his right shoulder and right arm to his elbow and lower back pain radiating into his right lower extremity and right foot. Also reports lower back pain has increased in intensity, we will order Xrays of his neck and lower back, he verbalizes understanding. He rates his pain 6. His current exercise regime is walking and performing stretching exercises.  Joshua [name] Morphine equivalent is 93.75 MME.  Oral Swab  ordered today.    Pain Inventory Average Pain 6 Pain Right Now 6 My pain is constant, sharp, stabbing, tingling, and aching  In the last 24 hours, has pain interfered with the following? General activity 6 Relation with others 4 Enjoyment of life 4 What TIME of day is your pain at its worst? 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  Tobacco Use   Smoking status: Every Day    Current packs/day: 0.00    Average packs/day: 1 pack/day for 25.0 years (25.0 ttl pk-yrs)    Types: Cigarettes    Start date: 10/23/1988    Last attempt to quit: 10/23/2013    Years since quitting: 9.6   Smokeless tobacco: Former   Tobacco comments:    .5ppd cigarettes-11/07/20  Vaping Use   Vaping status: Never Used  Substance and Sexual  Activity   Alcohol use: No    Alcohol/week: 0.0 standard drinks of alcohol   Drug use: No   Sexual activity: Yes    Birth control/protection: Condom  Other Topics Concern   Not on file  Social History Narrative   Not on file   Social Drivers of Health   Financial Resource Strain: Not on file  Food Insecurity: Not on file  Transportation Needs: Not on file  Physical Activity: Not on file  Stress: Not on file  Social Connections: Unknown (07/09/2021)   Received from [facility]   Social Network    Social Network: Not on file   Past Surgical History:  Procedure Laterality Date   KNEE SURGERY Left 2010   Past Surgical History:  Procedure Laterality Date   KNEE SURGERY Left 2010   Past Medical History:  Diagnosis Date   Disorders of sacrum    Displacement of lumbar intervertebral disc without myelopathy    Facet syndrome, lumbar    Lumbago    Thoracic or lumbosacral neuritis or radiculitis, unspecified    There were no vitals taken for this visit.  Opioid Risk Score:   Fall Risk Score:  `1  Depression screen [facility] 2/9     02/21/2023    8:29 AM 12/20/2022    8:57 AM 10/28/2022    8:48 AM 08/26/2022  8:48 AM 07/01/2022    8:16 AM 05/06/2022    8:10 AM 03/06/2022    8:56 AM  Depression screen PHQ 2/9  Decreased Interest 0 0 0 0 0 0 0  Down, Depressed, Hopeless 0 0 0 0 0 0 0  PHQ - 2 Score 0 0 0 0 0 0 0    Review of Systems  Musculoskeletal:  Positive for back pain and neck pain.       Right leg pain down to right foot Right shoulder pain  All other systems reviewed and are negative.      Objective:   Physical Exam Vitals and nursing note reviewed.  Constitutional:      Appearance: Normal appearance.  Neck:     Comments: Cervical Paraspinal Tenderness: C-5- C-6 Cardiovascular:     Rate and Rhythm: Normal rate and regular rhythm.     Pulses: Normal pulses.     Heart sounds: Normal heart sounds.  Pulmonary:     Effort: Pulmonary effort  is normal.     Breath sounds: Normal breath sounds.  Musculoskeletal:     Comments: Normal Muscle Bulk and Muscle Testing Reveals:  Upper Extremities: Full ROM and Muscle Strength 5/5 Right AC Joint Tenderness  Lumbar Paraspinal Tenderness: L-3-L-5 Lower Extremities: Full ROM and Muscle Strength 5/5  Arises from chair with ease  Narrow Based  Gait     Skin:    General: Skin is warm and dry.  Neurological:     Mental Status: He is alert and oriented to person, place, and time.  Psychiatric:        Mood and Affect: Mood normal.        Behavior: Behavior normal.         Assessment & Plan:  1. Acute Exacerbation of Chronic Low Back Pain: RX: Lumbar X-ray. Continue to monitor.  2. Cervicalgia/ Cervical Radiculitis: RX: Cervical X-ray.  3.Chronic low back pain/ Lumbar Radiculopathy: Continue with Home Exercise Regime. 06/13/2023 4. Degenerative disk disease lumbar spine: 06/13/2023 Refilled: Morphine ( MS Contin) 30 mg one  tablet every 12 hours #60 and Percocet 7.5/325 mg one tablet every 8 hours prn #90. Second script e-scribe for the following month. 06/13/2023 We will continue the opioid monitoring program, this consists of regular clinic visits, examinations, urine drug screen, pill counts as well as use of [place] Controlled Substance Reporting system. A 12 month History has been reviewed on the [place] Controlled Substance Reporting System on 06/13/2023. 5. Tobacco Abuse: Educated on smoking cessation: He verbalizes understanding. 06/13/2023 6.  Morbid Obesity: Continue with Diabetic Diet and HEP as tolerated. Continue to monitor. 06/13/2023   F/U in 2 months

## 2023-06-13 ENCOUNTER — Encounter: Payer: Self-pay | Attending: Physical Medicine & Rehabilitation | Admitting: Registered Nurse

## 2023-06-13 ENCOUNTER — Encounter: Payer: Self-pay | Admitting: Registered Nurse

## 2023-06-13 VITALS — BP 139/91 | HR 61 | Ht 68.0 in | Wt 263.0 lb

## 2023-06-13 DIAGNOSIS — M5412 Radiculopathy, cervical region: Secondary | ICD-10-CM | POA: Insufficient documentation

## 2023-06-13 DIAGNOSIS — Z5181 Encounter for therapeutic drug level monitoring: Secondary | ICD-10-CM | POA: Insufficient documentation

## 2023-06-13 DIAGNOSIS — Z79891 Long term (current) use of opiate analgesic: Secondary | ICD-10-CM | POA: Insufficient documentation

## 2023-06-13 DIAGNOSIS — G894 Chronic pain syndrome: Secondary | ICD-10-CM | POA: Diagnosis present

## 2023-06-13 DIAGNOSIS — M5416 Radiculopathy, lumbar region: Secondary | ICD-10-CM | POA: Insufficient documentation

## 2023-06-13 DIAGNOSIS — M542 Cervicalgia: Secondary | ICD-10-CM | POA: Diagnosis present

## 2023-06-13 DIAGNOSIS — M545 Low back pain, unspecified: Secondary | ICD-10-CM | POA: Insufficient documentation

## 2023-06-13 DIAGNOSIS — G8929 Other chronic pain: Secondary | ICD-10-CM | POA: Diagnosis present

## 2023-06-13 MED ORDER — MORPHINE SULFATE ER 30 MG PO TBCR: 30.0000 mg | EXTENDED_RELEASE_TABLET | Freq: Two times a day (BID) | ORAL | 0 refills | Status: DC

## 2023-06-13 MED ORDER — OXYCODONE-ACETAMINOPHEN 7.5-325 MG PO TABS: 1.0000 | ORAL_TABLET | Freq: Three times a day (TID) | ORAL | 0 refills | Status: DC | PRN

## 2023-06-17 ENCOUNTER — Telehealth: Payer: Self-pay

## 2023-06-17 ENCOUNTER — Other Ambulatory Visit: Payer: Self-pay

## 2023-06-17 LAB — DRUG TOX MONITOR 1 W/CONF, ORAL FLD
Amphetamines: NEGATIVE ng/mL (ref <10)
Barbiturates: NEGATIVE ng/mL (ref <10)
Benzodiazepines: NEGATIVE ng/mL (ref <0.50)
Buprenorphine: NEGATIVE ng/mL (ref <0.10)
Cocaine: NEGATIVE ng/mL (ref <5.0)
Codeine: NEGATIVE ng/mL (ref <2.5)
Cotinine: >250 ng/mL — ABNORMAL HIGH (ref <5.0)
Dihydrocodeine: NEGATIVE ng/mL (ref <2.5)
Fentanyl: NEGATIVE ng/mL (ref <0.10)
Heroin Metabolite: NEGATIVE ng/mL (ref <1.0)
Hydrocodone: NEGATIVE ng/mL (ref <2.5)
Hydromorphone: NEGATIVE ng/mL (ref <2.5)
MARIJUANA: NEGATIVE ng/mL (ref <2.5)
MDMA: NEGATIVE ng/mL (ref <10)
Meprobamate: NEGATIVE ng/mL (ref <2.5)
Methadone: NEGATIVE ng/mL (ref <5.0)
Morphine: 3.5 ng/mL — ABNORMAL HIGH (ref <2.5)
Nicotine Metabolite: POSITIVE ng/mL — AB (ref <5.0)
Norhydrocodone: NEGATIVE ng/mL (ref <2.5)
Noroxycodone: 19.2 ng/mL — ABNORMAL HIGH (ref <2.5)
Opiates: POSITIVE ng/mL — AB (ref <2.5)
Oxycodone: 76.5 ng/mL — ABNORMAL HIGH (ref <2.5)
Oxymorphone: NEGATIVE ng/mL (ref <2.5)
Phencyclidine: NEGATIVE ng/mL (ref <10)
Tapentadol: NEGATIVE ng/mL (ref <5.0)
Tramadol: NEGATIVE ng/mL (ref <5.0)
Zolpidem: NEGATIVE ng/mL (ref <5.0)

## 2023-06-17 LAB — DRUG TOX ALC METAB W/CON, ORAL FLD: Alcohol Metabolite: NEGATIVE ng/mL (ref <25)

## 2023-06-17 NOTE — Telephone Encounter (Signed)
 Per office policy patient would need to be seen for x-rays to be ordered under our providers and/or billed under them. Patient has not been seen since 2022 will need an OV with [name].

## 2023-06-17 NOTE — Telephone Encounter (Signed)
Pt. Needs to be seen for this. Thanks, WS 

## 2023-06-17 NOTE — Telephone Encounter (Signed)
 Patient has orders for x-rays of the neck and back from pain another provider and wants to come here to have them done. Per billing dept the orders will have to be under one of out providers. Please review and advise if you approve having the patient come in for x-rays under your name - you are listed as PCP LOV was in 2022.

## 2023-06-17 NOTE — Telephone Encounter (Signed)
 We do not do outside xray orders here at [facility]. Tried calling pt to make him aware of this and that he would need an appt with one of our providers first and see if they would order the xray before we could do it here but VM full.

## 2023-07-18 ENCOUNTER — Ambulatory Visit
Admission: RE | Admit: 2023-07-18 | Discharge: 2023-07-18 | Disposition: A | Discharge status: Home / Self Care | Source: Ambulatory Visit | Attending: Registered Nurse | Admitting: Registered Nurse

## 2023-07-18 ENCOUNTER — Telehealth: Payer: Self-pay | Admitting: Registered Nurse

## 2023-07-18 NOTE — Telephone Encounter (Signed)
 Placed a call to Mr. Thiry and Reviewed X-rays. He verbalizes understanding.

## 2023-08-08 ENCOUNTER — Encounter: Payer: Self-pay | Attending: Physical Medicine & Rehabilitation | Admitting: Registered Nurse

## 2023-08-08 ENCOUNTER — Encounter: Payer: Self-pay | Admitting: Registered Nurse

## 2023-08-08 VITALS — BP 129/81 | HR 95 | Ht 68.0 in | Wt 261.6 lb

## 2023-08-08 DIAGNOSIS — M5416 Radiculopathy, lumbar region: Secondary | ICD-10-CM | POA: Diagnosis present

## 2023-08-08 DIAGNOSIS — Z5181 Encounter for therapeutic drug level monitoring: Secondary | ICD-10-CM | POA: Diagnosis present

## 2023-08-08 DIAGNOSIS — M5412 Radiculopathy, cervical region: Secondary | ICD-10-CM | POA: Diagnosis present

## 2023-08-08 DIAGNOSIS — Z79891 Long term (current) use of opiate analgesic: Secondary | ICD-10-CM | POA: Insufficient documentation

## 2023-08-08 DIAGNOSIS — M542 Cervicalgia: Secondary | ICD-10-CM | POA: Insufficient documentation

## 2023-08-08 DIAGNOSIS — G894 Chronic pain syndrome: Secondary | ICD-10-CM | POA: Insufficient documentation

## 2023-08-08 MED ORDER — MORPHINE SULFATE ER 30 MG PO TBCR: 30.0000 mg | EXTENDED_RELEASE_TABLET | Freq: Two times a day (BID) | ORAL | 0 refills | Status: DC

## 2023-08-08 MED ORDER — OXYCODONE-ACETAMINOPHEN 7.5-325 MG PO TABS: 1.0000 | ORAL_TABLET | Freq: Three times a day (TID) | ORAL | 0 refills | Status: DC | PRN

## 2023-08-08 MED ORDER — GABAPENTIN 100 MG PO CAPS: 100.0000 mg | ORAL_CAPSULE | Freq: Three times a day (TID) | ORAL | 3 refills | Status: DC

## 2023-08-08 NOTE — Patient Instructions (Signed)
 Start Gabapentin tonight.  Take only Gabapentin 100 mg one capsule  at bedtime only for a week.   On June 6th if you are still experiencing nerve pain from your neck into your right  arm and Lower Back in your Right Leg  You can take Gabapentin 100 mg one capsule in the Morning and One Capsule at Bedtime for a week.   On June 13th if your are still experiencing nerve pain: You can take your Gabapentin 100 mg capsule one capsule three times a day.    Call or send My- Chart message if you have any questions or concerns.

## 2023-08-08 NOTE — Progress Notes (Signed)
 Subjective:    Patient ID: Joshua [name], male    DOB: 1975/12/05, 48 y.o.   MRN: [id number]  NWG:NFAO D Clopper is a 48 y.o. male who returns for follow up appointment for chronic pain and medication refill. He states his pain is located in his neck radiating into his right arm with tingling and lower back pain radiating into his right lower extremity. Also reports increase intensity of cervical radicular pain. X-rays was reviewed again, at this time he is not interested in injection, he will let provider know when he changes his mind. Gabapentin prescribed today with instructions, he verbalizes understanding. He rates his pain 6. His current exercise regime is walking and performing stretching exercises.  Mr. Degraffenreid Morphine  equivalent is 93.75 MME.   Last Oral Swab was performed on 06/13/2023, it was consistent.     Pain Inventory Average Pain 5 Pain Right Now 6 My pain is sharp, stabbing, and tingling  In the last 24 hours, has pain interfered with the following? General activity 5 Relation with others 4 Enjoyment of life 5 What TIME of day is your pain at its worst? 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  Tobacco Use   Smoking status: Every Day    Current packs/day: 0.00    Average packs/day: 1 pack/day for 25.0 years (25.0 ttl pk-yrs)    Types: Cigarettes    Start date: 10/23/1988    Last attempt to quit: 10/23/2013    Years since quitting: 9.7   Smokeless tobacco: Former    Tobacco comments:    .5ppd cigarettes-11/07/20  Vaping Use   Vaping status: Never Used  Substance and Sexual Activity   Alcohol  use: No    Alcohol /week: 0.0 standard drinks of alcohol    Drug use: No   Sexual activity: Yes    Birth control/protection: Condom  Other Topics Concern   Not on file  Social History Narrative   Not on file   Social Drivers of Health   Financial Resource Strain: Not on file  Food Insecurity: Not on file  Transportation Needs: Not on file  Physical Activity: Not on file  Stress: Not on file  Social Connections: Unknown (07/09/2021)   Received from [facility]   Social Network    Social Network: Not on file   Past Surgical History:  Procedure Laterality Date   KNEE SURGERY Left 2010   Past Surgical History:  Procedure Laterality Date   KNEE SURGERY Left 2010   Past Medical History:  Diagnosis Date   Disorders of sacrum    Displacement of lumbar intervertebral disc without myelopathy    Facet syndrome, lumbar    Lumbago    Thoracic or lumbosacral neuritis or radiculitis, unspecified    BP (!) 146/84   Pulse 95   Ht 5\' 8"  (1.727 m)   Wt 261 lb 9.6 oz (118.7 kg)   SpO2 95%   BMI 39.78 kg/m  Opioid Risk Score:   Fall Risk Score:  `1  Depression screen [facility] 2/9     08/08/2023    8:39 AM 06/13/2023    8:16 AM 02/21/2023    8:29 AM 12/20/2022    8:57 AM 10/28/2022    8:48 AM 08/26/2022    8:48 AM 07/01/2022    8:16 AM  Depression screen PHQ 2/9  Decreased Interest 0 0 0 0 0 0 0  Down, Depressed, Hopeless 0 0 0 0 0 0 0  PHQ - 2 Score 0 0 0 0 0 0 0     Review of Systems  Musculoskeletal:  Positive for back pain and neck pain.  All other systems reviewed and are negative.      Objective:   Physical Exam Vitals and nursing note reviewed.  Constitutional:      Appearance: Normal appearance. He is obese.  Neck:     Comments: Cervical Paraspinal Tenderness: C-5-C-6 Cardiovascular:     Rate and Rhythm: Normal rate  and regular rhythm.     Pulses: Normal pulses.     Heart sounds: Normal heart sounds.  Pulmonary:     Effort: Pulmonary effort is normal.     Breath sounds: Normal breath sounds.  Musculoskeletal:     Comments: Normal Muscle Bulk and Muscle Testing Reveals:  Upper Extremities: Full ROM and Muscle Strength 5/5 Right AC Joint Tenderness  Lumbar Paraspinal Tenderness: L-4-L-5 Lower Extremities: Full ROM and Muscle Strength 5/5 Arises from Chair with ease Narrow Based  Gait     Skin:    General: Skin is warm and dry.  Neurological:     Mental Status: He is alert and oriented to person, place, and time.  Psychiatric:        Mood and Affect: Mood normal.        Behavior: Behavior normal.          Assessment & Plan:  1, Cervicalgia/ Cervical Radiculitis: Refuses Injection at this time. Gabapentin prescribed with instructions. He verbalizes understanding.  2.  Chronic low back pain/ Lumbar Radiculopathy: Continue with Home Exercise Regime. 08/08/2023 3 Degenerative disk disease lumbar spine: 08/08/2023 Refilled: Morphine  ( MS Contin ) 30 mg one  tablet every 12 hours #60 and Percocet 7.5/325 mg one tablet every 8 hours prn #90. Second script e-scribe for the following month. 08/08/2023 We will continue the opioid monitoring program, this consists of regular clinic visits, examinations, urine drug screen, pill counts as well as use of [place]  Controlled Substance Reporting system. A 12 month History has been reviewed on the [place]  Controlled Substance Reporting System on 04/18/2023. 4. Tobacco Abuse: Educated on smoking cessation: He verbalizes understanding. 08/08/2023 5.  Morbid Obesity: Continue with Diabetic Diet and HEP as tolerated. Continue to monitor. 08/08/2023   F/U in 2 months

## 2023-08-15 ENCOUNTER — Ambulatory Visit: Payer: Self-pay | Admitting: Registered Nurse

## 2023-10-02 NOTE — Progress Notes (Signed)
 Subjective:    Patient ID: Joshua [name], male    DOB: October 28, 1975, 48 y.o.   MRN: [id number]  HPI: Joshua [name] is a 48 y.o. male who returns for follow up appointment for chronic pain and medication refill. He states his pain is located in his neck radiating into his right shoulder and lower back pain radiating into his right lower extremity. He rates his pain 4. His current exercise regime is walking and performing stretching exercises.  Joshua [name] Morphine  equivalent is 93.75 MME.   Last Oral Swab was Performed on 06/13/2023, it was consistent.      Pain Inventory Average Pain 4 Pain Right Now 4 My pain is sharp, stabbing, and aching  In the last 24 hours, has pain interfered with the following? General activity 4 Relation with others 3 Enjoyment of life 3 What TIME of day is your pain at its worst? varies Sleep (in general) NA  Pain is worse with: walking, bending, sitting, standing, and some activites Pain improves with: rest, therapy/exercise, and medication Relief from Meds: 5  Family History  Problem Relation Age of Onset   Hypertension Mother    Diabetes Father    Social History   Socioeconomic History   Marital status: Single    Spouse name: Not on file   Number of children: Not on file   Years of education: Not on file   Highest education level: Not on file  Occupational History   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  Tobacco Use   Smoking status: Every Day    Current packs/day: 0.00    Average packs/day: 1 pack/day for 25.0 years (25.0 ttl pk-yrs)    Types: Cigarettes    Start date: 10/23/1988    Last attempt to quit: 10/23/2013    Years since quitting: 9.9   Smokeless tobacco: Former   Tobacco comments:    .5ppd cigarettes-11/07/20  Vaping Use   Vaping status: Never Used  Substance and Sexual Activity   Alcohol  use: No    Alcohol /week: 0.0 standard drinks of alcohol    Drug use: No   Sexual activity: Yes    Birth control/protection: Condom   Other Topics Concern   Not on file  Social History Narrative   Not on file   Social Drivers of Health   Financial Resource Strain: Not on file  Food Insecurity: Not on file  Transportation Needs: Not on file  Physical Activity: Not on file  Stress: Not on file  Social Connections: Unknown (07/09/2021)   Received from [facility]   Social Network    Social Network: Not on file   Past Surgical History:  Procedure Laterality Date   KNEE SURGERY Left 2010   Past Surgical History:  Procedure Laterality Date   KNEE SURGERY Left 2010   Past Medical History:  Diagnosis Date   Disorders of sacrum    Displacement of lumbar intervertebral disc without myelopathy    Facet syndrome, lumbar    Lumbago    Thoracic or lumbosacral neuritis or radiculitis, unspecified    BP (!) 151/91   Pulse 61   Ht 5' 8 (1.727 m)   Wt 259 lb 12.8 oz (117.8 kg)   SpO2 96%   BMI 39.50 kg/m   Opioid Risk Score:   Fall Risk Score:  `1  Depression screen [facility] 2/9     10/03/2023    8:46 AM 08/08/2023    8:39 AM 06/13/2023    8:16 AM 02/21/2023  8:29 AM 12/20/2022    8:57 AM 10/28/2022    8:48 AM 08/26/2022    8:48 AM  Depression screen PHQ 2/9  Decreased Interest 0 0 0 0 0 0 0  Down, Depressed, Hopeless 0 0 0 0 0 0 0  PHQ - 2 Score 0 0 0 0 0 0 0    Review of Systems  Musculoskeletal:  Positive for back pain and neck pain.  All other systems reviewed and are negative.      Objective:   Physical Exam Vitals and nursing note reviewed.  Constitutional:      Appearance: Normal appearance.  Neck:     Comments: Cervical Paraspinal Tenderness: C-5-C-6  Cardiovascular:     Rate and Rhythm: Normal rate and regular rhythm.     Pulses: Normal pulses.     Heart sounds: Normal heart sounds.  Pulmonary:     Effort: Pulmonary effort is normal.     Breath sounds: Normal breath sounds.  Musculoskeletal:     Comments: Normal Muscle Bulk and Muscle Testing Reveals:  Upper Extremities: Full ROM  and Muscle Strength 5/5  Lumbar Paraspinal Tenderness: L-4-L-5 Lower Extremities: Full ROM and Muscle Strength 5/5 Arises from Table with ease Narrow Based  Gait     Skin:    General: Skin is warm and dry.  Neurological:     Mental Status: He is alert and oriented to person, place, and time.  Psychiatric:        Mood and Affect: Mood normal.        Behavior: Behavior normal.          Assessment & Plan:  1, Cervicalgia/ Cervical Radiculitis: Refuses Injection at this time. Gabapentin  increased  with instructions, he will call or send a My- Chart message in two weeks. He verbalizes understanding. 10/03/2023 2.  Chronic low back pain/ Lumbar Radiculopathy: Continue with Home Exercise Regime. 10/03/2023 3 Degenerative disk disease lumbar spine: 10/03/2023 Refilled: Morphine  ( MS Contin ) 30 mg one  tablet every 12 hours #60 and Percocet 7.5/325 mg one tablet every 8 hours prn #90. Second script e-scribe for the following month. 10/03/2023 We will continue the opioid monitoring program, this consists of regular clinic visits, examinations, urine drug screen, pill counts as well as use of [place]  Controlled Substance Reporting system. A 12 month History has been reviewed on the [place]  Controlled Substance Reporting System on 10/03/2023. 4. Tobacco Abuse: Educated on smoking cessation: He verbalizes understanding. 10/03/2023 5.  Morbid Obesity: Continue with Diabetic Diet and HEP as tolerated. Continue to monitor. 10/03/2023   F/U in 2 months

## 2023-10-03 ENCOUNTER — Encounter: Attending: Physical Medicine & Rehabilitation | Admitting: Registered Nurse

## 2023-10-03 ENCOUNTER — Encounter: Payer: Self-pay | Admitting: Registered Nurse

## 2023-10-03 VITALS — BP 138/83 | HR 61 | Ht 68.0 in | Wt 259.8 lb

## 2023-10-03 DIAGNOSIS — Z79891 Long term (current) use of opiate analgesic: Secondary | ICD-10-CM | POA: Diagnosis present

## 2023-10-03 DIAGNOSIS — M5412 Radiculopathy, cervical region: Secondary | ICD-10-CM | POA: Insufficient documentation

## 2023-10-03 DIAGNOSIS — Z5181 Encounter for therapeutic drug level monitoring: Secondary | ICD-10-CM | POA: Insufficient documentation

## 2023-10-03 DIAGNOSIS — M5416 Radiculopathy, lumbar region: Secondary | ICD-10-CM | POA: Insufficient documentation

## 2023-10-03 DIAGNOSIS — M542 Cervicalgia: Secondary | ICD-10-CM | POA: Diagnosis present

## 2023-10-03 DIAGNOSIS — G894 Chronic pain syndrome: Secondary | ICD-10-CM | POA: Diagnosis not present

## 2023-10-03 MED ORDER — MORPHINE SULFATE ER 30 MG PO TBCR: 30.0000 mg | EXTENDED_RELEASE_TABLET | Freq: Two times a day (BID) | ORAL | 0 refills | Status: DC

## 2023-10-03 MED ORDER — OXYCODONE-ACETAMINOPHEN 7.5-325 MG PO TABS: 1.0000 | ORAL_TABLET | Freq: Three times a day (TID) | ORAL | 0 refills | Status: DC | PRN

## 2023-10-03 NOTE — Patient Instructions (Signed)
 Gabapentin : Increase Take Gabapentin  100 mg ( 1 capsule in the morning and late afternoon) and Take two capsules ( 200 mg ) at Bedtime.  For the next 5 days   If Nerve pain persists take Gabapentin  one capsule in the morning, two capsules late afternoon and two capsules at  bedtime   Call or send a My- Chart message in two weeks    [phone]

## 2023-12-05 ENCOUNTER — Encounter: Attending: Physical Medicine & Rehabilitation | Admitting: Registered Nurse

## 2023-12-05 ENCOUNTER — Encounter: Payer: Self-pay | Admitting: Registered Nurse

## 2023-12-05 VITALS — BP 139/79 | HR 59 | Ht 68.0 in | Wt 260.0 lb

## 2023-12-05 DIAGNOSIS — M5416 Radiculopathy, lumbar region: Secondary | ICD-10-CM | POA: Insufficient documentation

## 2023-12-05 DIAGNOSIS — Z79891 Long term (current) use of opiate analgesic: Secondary | ICD-10-CM | POA: Diagnosis present

## 2023-12-05 DIAGNOSIS — Z5181 Encounter for therapeutic drug level monitoring: Secondary | ICD-10-CM | POA: Insufficient documentation

## 2023-12-05 DIAGNOSIS — M542 Cervicalgia: Secondary | ICD-10-CM | POA: Insufficient documentation

## 2023-12-05 DIAGNOSIS — G894 Chronic pain syndrome: Secondary | ICD-10-CM | POA: Insufficient documentation

## 2023-12-05 MED ORDER — MORPHINE SULFATE ER 30 MG PO TBCR: 30.0000 mg | EXTENDED_RELEASE_TABLET | Freq: Two times a day (BID) | ORAL | 0 refills | Status: DC

## 2023-12-05 MED ORDER — OXYCODONE-ACETAMINOPHEN 7.5-325 MG PO TABS: 1.0000 | ORAL_TABLET | Freq: Three times a day (TID) | ORAL | 0 refills | Status: DC | PRN

## 2023-12-05 NOTE — Progress Notes (Signed)
 Subjective:    Patient ID: Joshua [name], male    DOB: [birth year], 48 y.o.   MRN: [id number]  HPI: Joshua [name] is a 48 y.o. male who returns for follow up appointment for chronic pain and medication refill. He states his pain is located in his neck, and lower back radiating into her right lower extremity. He rates his pain 5. His current exercise regime is walking and performing stretching exercises.  Mr. Stegmann Morphine  equivalent is 93.75 MME.   Oral Swab was Performed today.      Pain Inventory Average Pain 4 Pain Right Now 5 My pain is constant, sharp, stabbing, and aching  In the last 24 hours, has pain interfered with the following? General activity 4 Relation with others 3 Enjoyment of life 3 What TIME of day is your pain at its worst? morning , daytime, evening, and night Sleep (in general) Fair  Pain is worse with: walking, bending, and standing Pain improves with: rest, therapy/exercise, and medication Relief from Meds: 4  Family History  Problem Relation Age of Onset   Hypertension Mother    Diabetes Father    Social History   Socioeconomic History   Marital status: Single    Spouse name: Not on file   Number of children: Not on file   Years of education: Not on file   Highest education level: Not on file  Occupational History   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  Tobacco Use   Smoking status: Every Day    Current packs/day: 0.00    Average packs/day: 1 pack/day for 25.0 years (25.0 ttl pk-yrs)    Types: Cigarettes    Start date: 10/23/1988    Last attempt to quit: 10/23/2013    Years since quitting: 10.1   Smokeless tobacco: Former   Tobacco comments:    .5ppd cigarettes-11/07/20  Vaping Use   Vaping status: Never Used  Substance and Sexual Activity   Alcohol  use: No    Alcohol /week: 0.0 standard drinks of alcohol    Drug use: No   Sexual activity: Yes    Birth control/protection: Condom  Other Topics Concern   Not on file  Social History  Narrative   Not on file   Social Drivers of Health   Financial Resource Strain: Not on file  Food Insecurity: Not on file  Transportation Needs: Not on file  Physical Activity: Not on file  Stress: Not on file  Social Connections: Unknown (07/09/2021)   Received from [facility]   Social Network    Social Network: Not on file   Past Surgical History:  Procedure Laterality Date   KNEE SURGERY Left 2010   Past Surgical History:  Procedure Laterality Date   KNEE SURGERY Left 2010   Past Medical History:  Diagnosis Date   Disorders of sacrum    Displacement of lumbar intervertebral disc without myelopathy    Facet syndrome, lumbar    Lumbago    Thoracic or lumbosacral neuritis or radiculitis, unspecified    There were no vitals taken for this visit.  Opioid Risk Score:   Fall Risk Score:  `1  Depression screen [facility] 2/9     10/03/2023    8:46 AM 08/08/2023    8:39 AM 06/13/2023    8:16 AM 02/21/2023    8:29 AM 12/20/2022    8:57 AM 10/28/2022    8:48 AM 08/26/2022    8:48 AM  Depression screen [facility] 2/9  Decreased Interest 0 0 0 0 0  0 0  Down, Depressed, Hopeless 0 0 0 0 0 0 0  PHQ - 2 Score 0 0 0 0 0 0 0    Review of Systems  Musculoskeletal:  Positive for back pain and neck pain.       Pain from right hip down to right foot, Right shoulder down to right hand  All other systems reviewed and are negative.      Objective:   Physical Exam Vitals and nursing note reviewed.  Constitutional:      Appearance: Normal appearance.  Neck:     Comments: Cervical Paraspinal Tenderness: C-5-C-6 Cardiovascular:     Rate and Rhythm: Normal rate and regular rhythm.     Pulses: Normal pulses.     Heart sounds: Normal heart sounds.  Pulmonary:     Effort: Pulmonary effort is normal.     Breath sounds: Normal breath sounds.  Musculoskeletal:     Comments: Normal Muscle Bulk and Muscle Testing Reveals:  Upper Extremities: Full ROM and Muscle Strength 5/5  Lumbar Paraspinal  Tenderness: L-4-L-5 Lower Extremities: Full ROM and Muscle Strength 5/5 Arises from Table with ease Narrow Based  Gait     Skin:    General: Skin is warm and dry.  Neurological:     Mental Status: He is alert and oriented to person, place, and time.  Psychiatric:        Mood and Affect: Mood normal.        Behavior: Behavior normal.          Assessment & Plan:  1, Cervicalgia/ Cervical Radiculitis:  Gabapentin  discontinued due to adverse instructions. He verbalizes understanding. 12/05/2023 2.  Chronic low back pain/ Lumbar Radiculopathy: Continue with Home Exercise Regime. 12/05/2023 3 Degenerative disk disease lumbar spine: 12/05/2023 Refilled: Morphine  ( MS Contin ) 30 mg one  tablet every 12 hours #60 and Percocet 7.5/325 mg one tablet every 8 hours prn #90. Second script e-scribe for the following month. 12/05/2023 We will continue the opioid monitoring program, this consists of regular clinic visits, examinations, urine drug screen, pill counts as well as use of [place]  Controlled Substance Reporting system. A 12 month History has been reviewed on the [place]  Controlled Substance Reporting System on 12/05/2023. 4. Tobacco Abuse: Educated on smoking cessation: He verbalizes understanding. 12/05/2023 5.  Morbid Obesity: Continue with Diabetic Diet and HEP as tolerated. Continue to monitor. 12/05/2023   F/U in 2 months

## 2023-12-10 LAB — DRUG TOX MONITOR 1 W/CONF, ORAL FLD
Amphetamines: NEGATIVE ng/mL (ref <10)
Barbiturates: NEGATIVE ng/mL (ref <10)
Benzodiazepines: NEGATIVE ng/mL (ref <0.50)
Buprenorphine: NEGATIVE ng/mL (ref <0.10)
Cocaine: NEGATIVE ng/mL (ref <5.0)
Codeine: NEGATIVE ng/mL (ref <2.5)
Cotinine: 216.6 ng/mL — ABNORMAL HIGH (ref <5.0)
Dihydrocodeine: NEGATIVE ng/mL (ref <2.5)
Fentanyl: NEGATIVE ng/mL (ref <0.10)
Heroin Metabolite: NEGATIVE ng/mL (ref <1.0)
Hydrocodone: NEGATIVE ng/mL (ref <2.5)
Hydromorphone: NEGATIVE ng/mL (ref <2.5)
MARIJUANA: NEGATIVE ng/mL (ref <2.5)
MDMA: NEGATIVE ng/mL (ref <10)
Meprobamate: NEGATIVE ng/mL (ref <2.5)
Methadone: NEGATIVE ng/mL (ref <5.0)
Morphine: 5.8 ng/mL — ABNORMAL HIGH (ref <2.5)
Nicotine Metabolite: POSITIVE ng/mL — AB (ref <5.0)
Norhydrocodone: NEGATIVE ng/mL (ref <2.5)
Noroxycodone: 29.5 ng/mL — ABNORMAL HIGH (ref <2.5)
Opiates: POSITIVE ng/mL — AB (ref <2.5)
Oxycodone: 140.2 ng/mL — ABNORMAL HIGH (ref <2.5)
Oxymorphone: NEGATIVE ng/mL (ref <2.5)
Phencyclidine: NEGATIVE ng/mL (ref <10)
Tapentadol: NEGATIVE ng/mL (ref <5.0)
Tramadol: NEGATIVE ng/mL (ref <5.0)
Zolpidem: NEGATIVE ng/mL (ref <5.0)

## 2023-12-10 LAB — DRUG TOX ALC METAB W/CON, ORAL FLD: Alcohol Metabolite: NEGATIVE ng/mL (ref <25)

## 2024-02-02 ENCOUNTER — Encounter: Attending: Physical Medicine & Rehabilitation | Admitting: Registered Nurse

## 2024-02-02 ENCOUNTER — Encounter: Payer: Self-pay | Admitting: Registered Nurse

## 2024-02-02 VITALS — BP 136/88 | HR 64 | Ht 68.0 in | Wt 261.0 lb

## 2024-02-02 DIAGNOSIS — M542 Cervicalgia: Secondary | ICD-10-CM | POA: Diagnosis not present

## 2024-02-02 DIAGNOSIS — G8929 Other chronic pain: Secondary | ICD-10-CM | POA: Diagnosis present

## 2024-02-02 DIAGNOSIS — G894 Chronic pain syndrome: Secondary | ICD-10-CM | POA: Diagnosis not present

## 2024-02-02 DIAGNOSIS — M545 Low back pain, unspecified: Secondary | ICD-10-CM | POA: Insufficient documentation

## 2024-02-02 MED ORDER — MORPHINE SULFATE ER 30 MG PO TBCR: 30.0000 mg | EXTENDED_RELEASE_TABLET | Freq: Two times a day (BID) | ORAL | 0 refills | Status: DC

## 2024-02-02 MED ORDER — OXYCODONE-ACETAMINOPHEN 7.5-325 MG PO TABS: 1.0000 | ORAL_TABLET | Freq: Three times a day (TID) | ORAL | 0 refills | Status: DC | PRN

## 2024-02-02 NOTE — Progress Notes (Signed)
 Subjective:    Patient ID: [name], male    DOB: 1975-05-10, 48 y.o.   MRN: [id number]  HPI: Joshua [name] is a 48 y.o. male who returns for follow up appointment for chronic pain and medication refill. He states his pain is located in lower back and occassionally radiating into his left lower extremity. He rates his pain 6. His current exercise regime is walking and performing stretching exercises.  Joshua [name] Morphine  equivalent is 93.75 MME.   Last Oral Swab was Performed on 12/05/2023, it was consistent.    Pain Inventory Average Pain 5 Pain Right Now 6 My pain is constant, sharp, dull, and aching  In the last 24 hours, has pain interfered with the following? General activity 3 Relation with others 3 Enjoyment of life 3 What TIME of day is your pain at its worst? daytime and evening Sleep (in general) Fair  Pain is worse with: walking, bending, sitting, and standing Pain improves with: rest, therapy/exercise, and medication Relief from Meds: 6  Family History  Problem Relation Age of Onset   Hypertension Mother    Diabetes Father    Social History   Socioeconomic History   Marital status: Single    Spouse name: Not on file   Number of children: Not on file   Years of education: Not on file   Highest education level: Not on file  Occupational History   Occupation: [employer]  Tobacco Use   Smoking status: Every Day    Current packs/day: 0.00    Average packs/day: 1 pack/day for 25.0 years (25.0 ttl pk-yrs)    Types: Cigarettes    Start date: 10/23/1988    Last attempt to quit: 10/23/2013    Years since quitting: 10.2   Smokeless tobacco: Former   Tobacco comments:    .5ppd cigarettes-11/07/20  Vaping Use   Vaping status: Never Used  Substance and Sexual Activity   Alcohol  use: No    Alcohol /week: 0.0 standard drinks of alcohol    Drug use: No   Sexual activity: Yes    Birth control/protection: Condom  Other Topics Concern   Not on file   Social History Narrative   Not on file   Social Drivers of Health   Financial Resource Strain: Not on file  Food Insecurity: Not on file  Transportation Needs: Not on file  Physical Activity: Not on file  Stress: Not on file  Social Connections: Unknown (07/09/2021)   Received from [facility]   Social Network    Social Network: Not on file   Past Surgical History:  Procedure Laterality Date   KNEE SURGERY Left 2010   Past Surgical History:  Procedure Laterality Date   KNEE SURGERY Left 2010   Past Medical History:  Diagnosis Date   Disorders of sacrum    Displacement of lumbar intervertebral disc without myelopathy    Facet syndrome, lumbar    Lumbago    Thoracic or lumbosacral neuritis or radiculitis, unspecified    There were no vitals taken for this visit.  Opioid Risk Score:   Fall Risk Score:  `1  Depression screen [facility] 2/9     12/05/2023    9:35 AM 10/03/2023    8:46 AM 08/08/2023    8:39 AM 06/13/2023    8:16 AM 02/21/2023    8:29 AM 12/20/2022    8:57 AM 10/28/2022    8:48 AM  Depression screen PHQ 2/9  Decreased Interest 0 0 0 0 0  0 0  Down, Depressed, Hopeless 0 0 0 0 0 0 0  PHQ - 2 Score 0 0 0 0 0 0 0    Review of Systems  Musculoskeletal:  Positive for back pain.       Pain down the right leg down to the right foot       Objective:   Physical Exam Vitals and nursing note reviewed.  Constitutional:      Appearance: Normal appearance.  Cardiovascular:     Rate and Rhythm: Normal rate and regular rhythm.     Pulses: Normal pulses.     Heart sounds: Normal heart sounds.  Pulmonary:     Effort: Pulmonary effort is normal.     Breath sounds: Normal breath sounds.  Musculoskeletal:     Comments: Normal Muscle Bulk and Muscle Testing Reveals:  Upper Extremities:Full ROM and Muscle Strength 5/5   Lumbar Paraspinal Tenderness: L-4-L-5 Lower Extremities: Full ROM and Muscle Strength 5/5 Arises from Chair with ease Narrow Based  Gait      Skin:    General: Skin is warm and dry.  Neurological:     Mental Status: He is alert and oriented to person, place, and time.  Psychiatric:        Mood and Affect: Mood normal.        Behavior: Behavior normal.          Assessment & Plan:  1, Cervicalgia/ Cervical Radiculitis:  Gabapentin  discontinued due to adverse instructions. He verbalizes understanding. 02/02/2024 2.  Chronic low back pain/ Lumbar Radiculopathy: Continue with Home Exercise Regime. 02/02/2024 3 Degenerative disk disease lumbar spine: 02/02/2024 Refilled: Morphine  ( MS Contin ) 30 mg one  tablet every 12 hours #60 and Percocet 7.5/325 mg one tablet every 8 hours prn #90. Second script e-scribe for the following month. 02/02/2024 We will continue the opioid monitoring program, this consists of regular clinic visits, examinations, urine drug screen, pill counts as well as use of [place]  Controlled Substance Reporting system. A 12 month History has been reviewed on the [place]  Controlled Substance Reporting System on 02/02/2024. 4. Tobacco Abuse: Educated on smoking cessation: He verbalizes understanding. 02/02/2024 5.  Morbid Obesity: Continue with Diabetic Diet and HEP as tolerated. Continue to monitor. 02/02/2024   F/U in 2 months

## 2024-02-28 ENCOUNTER — Emergency Department (HOSPITAL_BASED_OUTPATIENT_CLINIC_OR_DEPARTMENT_OTHER)

## 2024-02-28 ENCOUNTER — Encounter (HOSPITAL_BASED_OUTPATIENT_CLINIC_OR_DEPARTMENT_OTHER): Payer: Self-pay

## 2024-02-28 ENCOUNTER — Other Ambulatory Visit: Payer: Self-pay

## 2024-02-28 ENCOUNTER — Observation Stay (HOSPITAL_BASED_OUTPATIENT_CLINIC_OR_DEPARTMENT_OTHER)
Admission: EM | Admit: 2024-02-28 | Discharge: 2024-03-01 | Disposition: A | Discharge status: Home / Self Care | Attending: Surgery | Admitting: Surgery

## 2024-02-28 ENCOUNTER — Observation Stay (HOSPITAL_COMMUNITY)

## 2024-02-28 DIAGNOSIS — K8012 Calculus of gallbladder with acute and chronic cholecystitis without obstruction: Secondary | ICD-10-CM | POA: Diagnosis not present

## 2024-02-28 DIAGNOSIS — K810 Acute cholecystitis: Principal | ICD-10-CM

## 2024-02-28 DIAGNOSIS — F17210 Nicotine dependence, cigarettes, uncomplicated: Secondary | ICD-10-CM | POA: Insufficient documentation

## 2024-02-28 DIAGNOSIS — Z743 Need for continuous supervision: Secondary | ICD-10-CM | POA: Diagnosis not present

## 2024-02-28 DIAGNOSIS — R109 Unspecified abdominal pain: Secondary | ICD-10-CM | POA: Diagnosis present

## 2024-02-28 DIAGNOSIS — K819 Cholecystitis, unspecified: Secondary | ICD-10-CM | POA: Diagnosis present

## 2024-02-28 DIAGNOSIS — R1084 Generalized abdominal pain: Secondary | ICD-10-CM | POA: Diagnosis not present

## 2024-02-28 DIAGNOSIS — R1011 Right upper quadrant pain: Secondary | ICD-10-CM | POA: Diagnosis present

## 2024-02-28 DIAGNOSIS — I10 Essential (primary) hypertension: Secondary | ICD-10-CM | POA: Diagnosis not present

## 2024-02-28 HISTORY — DX: Gastro-esophageal reflux disease without esophagitis: K21.9

## 2024-02-28 LAB — URINALYSIS, ROUTINE W REFLEX MICROSCOPIC
Bacteria, UA: NONE SEEN
Bilirubin Urine: NEGATIVE
Glucose, UA: NEGATIVE mg/dL
Ketones, ur: NEGATIVE mg/dL
Leukocytes,Ua: NEGATIVE
Nitrite: NEGATIVE
Protein, ur: NEGATIVE mg/dL
Specific Gravity, Urine: 1.027 (ref 1.005–1.030)
pH: 5.5 (ref 5.0–8.0)

## 2024-02-28 LAB — COMPREHENSIVE METABOLIC PANEL WITH GFR
ALT: 23 U/L (ref 0–44)
AST: 21 U/L (ref 15–41)
Albumin: 4.7 g/dL (ref 3.5–5.0)
Alkaline Phosphatase: 77 U/L (ref 38–126)
Anion gap: 12 (ref 5–15)
BUN: 18 mg/dL (ref 6–20)
CO2: 25 mmol/L (ref 22–32)
Calcium: 10.2 mg/dL (ref 8.9–10.3)
Chloride: 102 mmol/L (ref 98–111)
Creatinine, Ser: 0.8 mg/dL (ref 0.61–1.24)
GFR, Estimated: >60 mL/min
Glucose, Bld: 173 mg/dL — ABNORMAL HIGH (ref 70–99)
Potassium: 4.3 mmol/L (ref 3.5–5.1)
Sodium: 138 mmol/L (ref 135–145)
Total Bilirubin: 0.4 mg/dL (ref 0.0–1.2)
Total Protein: 7.9 g/dL (ref 6.5–8.1)

## 2024-02-28 LAB — CBC
HCT: 46.5 % (ref 39.0–52.0)
Hemoglobin: 16 g/dL (ref 13.0–17.0)
MCH: 29.6 pg (ref 26.0–34.0)
MCHC: 34.4 g/dL (ref 30.0–36.0)
MCV: 86.1 fL (ref 80.0–100.0)
Platelets: 255 K/uL (ref 150–400)
RBC: 5.4 MIL/uL (ref 4.22–5.81)
RDW: 12 % (ref 11.5–15.5)
WBC: 14.2 K/uL — ABNORMAL HIGH (ref 4.0–10.5)
nRBC: 0 % (ref 0.0–0.2)

## 2024-02-28 LAB — LIPASE, BLOOD: Lipase: 20 U/L (ref 11–51)

## 2024-02-28 LAB — HIV ANTIBODY (ROUTINE TESTING W REFLEX): HIV Screen 4th Generation wRfx: NONREACTIVE

## 2024-02-28 MED ORDER — HYDROMORPHONE HCL 1 MG/ML IJ SOLN
1.0000 mg | INTRAMUSCULAR | Status: DC | PRN
  Administered 2024-02-28 (×2): 1 mg via INTRAVENOUS
  Filled 2024-02-28 (×2): qty 1

## 2024-02-28 MED ORDER — HYDROMORPHONE HCL 1 MG/ML IJ SOLN: 2.0000 mg | INTRAMUSCULAR | Status: DC | PRN

## 2024-02-28 MED ORDER — MORPHINE SULFATE ER 30 MG PO TBCR
30.0000 mg | EXTENDED_RELEASE_TABLET | Freq: Two times a day (BID) | ORAL | Status: DC
  Administered 2024-02-28 – 2024-03-01 (×4): 30 mg via ORAL
  Filled 2024-02-28 (×4): qty 1

## 2024-02-28 MED ORDER — IOHEXOL 300 MG/ML  SOLN
100.0000 mL | Freq: Once | INTRAMUSCULAR | Status: AC | PRN
  Administered 2024-02-28: 100 mL via INTRAVENOUS

## 2024-02-28 MED ORDER — SENNA 8.6 MG PO TABS
1.0000 | ORAL_TABLET | Freq: Two times a day (BID) | ORAL | Status: DC
  Administered 2024-02-28 – 2024-03-01 (×4): 8.6 mg via ORAL
  Filled 2024-02-28 (×5): qty 1

## 2024-02-28 MED ORDER — HYDROMORPHONE HCL 1 MG/ML IJ SOLN
1.0000 mg | Freq: Once | INTRAMUSCULAR | Status: AC
  Administered 2024-02-28: 1 mg via INTRAVENOUS
  Filled 2024-02-28: qty 1

## 2024-02-28 MED ORDER — HYDROMORPHONE HCL 1 MG/ML IJ SOLN: 0.5000 mg | Freq: Once | INTRAMUSCULAR | Status: DC

## 2024-02-28 MED ORDER — DIPHENHYDRAMINE HCL 25 MG PO CAPS: 25.0000 mg | ORAL_CAPSULE | Freq: Four times a day (QID) | ORAL | Status: DC | PRN

## 2024-02-28 MED ORDER — ONDANSETRON 4 MG PO TBDP
4.0000 mg | ORAL_TABLET | Freq: Four times a day (QID) | ORAL | Status: DC | PRN
  Administered 2024-02-28: 4 mg via ORAL
  Filled 2024-02-28: qty 1

## 2024-02-28 MED ORDER — OXYCODONE HCL 5 MG PO TABS
5.0000 mg | ORAL_TABLET | ORAL | Status: DC | PRN
  Administered 2024-02-28 – 2024-03-01 (×5): 10 mg via ORAL
  Filled 2024-02-28 (×5): qty 2

## 2024-02-28 MED ORDER — DIPHENHYDRAMINE HCL 50 MG/ML IJ SOLN: 25.0000 mg | Freq: Four times a day (QID) | INTRAMUSCULAR | Status: DC | PRN

## 2024-02-28 MED ORDER — ONDANSETRON HCL 4 MG/2ML IJ SOLN
4.0000 mg | Freq: Once | INTRAMUSCULAR | Status: AC
  Administered 2024-02-28: 4 mg via INTRAVENOUS
  Filled 2024-02-28: qty 2

## 2024-02-28 MED ORDER — SODIUM CHLORIDE 0.9 % IV SOLN
2.0000 g | INTRAVENOUS | Status: DC
  Administered 2024-02-28 – 2024-02-29 (×2): 2 g via INTRAVENOUS
  Filled 2024-02-28 (×3): qty 20

## 2024-02-28 MED ORDER — LACTATED RINGERS IV SOLN: INTRAVENOUS | Status: AC

## 2024-02-28 MED ORDER — ONDANSETRON HCL 4 MG/2ML IJ SOLN: 4.0000 mg | Freq: Four times a day (QID) | INTRAMUSCULAR | Status: DC | PRN

## 2024-02-28 MED ORDER — ACETAMINOPHEN 500 MG PO TABS
1000.0000 mg | ORAL_TABLET | Freq: Four times a day (QID) | ORAL | Status: DC
  Administered 2024-02-28 – 2024-03-01 (×5): 1000 mg via ORAL
  Filled 2024-02-28 (×6): qty 2

## 2024-02-28 NOTE — ED Triage Notes (Signed)
 He c/o ruq area abd. Pain radiating toward his back all night last night. He reports one episode of n/v.

## 2024-02-28 NOTE — ED Notes (Signed)
 Chart marked as green ready

## 2024-02-28 NOTE — Progress Notes (Signed)
 Pt called out for pain meds stating he not getting any relief. Paged Dr. Dann Hummer, called back with new orders.

## 2024-02-28 NOTE — ED Notes (Signed)
Handoff report given to [facility] 

## 2024-02-28 NOTE — H&P (Signed)
 Joshua [name] is an 48 y.o. male.   Chief Complaint: RUQ pain HPI: 48yo M with PMHx chronic back pain developed right upper quadrant abdominal pain at midnight last night.  The pain persisted and [facility]  Evaluation there included white blood cell count which was 14,200.  Liver function tests were normal.  CT scan of the abdomen pelvis was done demonstrating a distended gallbladder and mildly dilated common bile duct but no gallstones.  There were some pericholecystic inflammatory change questioning early cholecystitis.  He was accepted in transfer to [facility].  He continues to have pain in the right upper quadrant.  Past Medical History:  Diagnosis Date   Disorders of sacrum    Displacement of lumbar intervertebral disc without myelopathy    Facet syndrome, lumbar    Lumbago    Thoracic or lumbosacral neuritis or radiculitis, unspecified     Past Surgical History:  Procedure Laterality Date   KNEE SURGERY Left 2010    Family History  Problem Relation Age of Onset   Hypertension Mother    Diabetes Father    Social History:  reports that he has been smoking cigarettes. He started smoking about 35 years ago. He has a 25 pack-year smoking history. He has quit using smokeless tobacco. He reports that he does not drink alcohol  and does not use drugs.  Allergies: Allergies[1]  Medications Prior to Admission  Medication Sig Dispense Refill   morphine  (MS CONTIN ) 30 MG 12 hr tablet Take 1 tablet (30 mg total) by mouth every 12 (twelve) hours. 60 tablet 0   oxyCODONE -acetaminophen  (PERCOCET) 7.5-325 MG tablet Take 1 tablet by mouth every 8 (eight) hours as needed. 90 tablet 0    Results for orders placed or performed during the hospital encounter of 02/28/24 (from the past 48 hours)  Urinalysis, Routine w reflex microscopic -Urine, Clean Catch     Status: Abnormal   Collection Time: 02/28/24  8:41 AM  Result Value Ref Range   Color, Urine YELLOW YELLOW    APPearance CLEAR CLEAR   Specific Gravity, Urine 1.027 1.005 - 1.030   pH 5.5 5.0 - 8.0   Glucose, UA NEGATIVE NEGATIVE mg/dL   Hgb urine dipstick SMALL (A) NEGATIVE   Bilirubin Urine NEGATIVE NEGATIVE   Ketones, ur NEGATIVE NEGATIVE mg/dL   Protein, ur NEGATIVE NEGATIVE mg/dL   Nitrite NEGATIVE NEGATIVE   Leukocytes,Ua NEGATIVE NEGATIVE   RBC / HPF 0-5 0 - 5 RBC/hpf   WBC, UA 0-5 0 - 5 WBC/hpf   Bacteria, UA NONE SEEN NONE SEEN   Squamous Epithelial / HPF 0-5 0 - 5 /HPF   Mucus PRESENT     Comment: Performed at [facility], [address] [place], [place] [address]  Lipase, blood     Status: None   Collection Time: 02/28/24  8:47 AM  Result Value Ref Range   Lipase 20 11 - 51 U/L    Comment: Performed at [facility], [address] [place], [place] [address]  Comprehensive metabolic panel     Status: Abnormal   Collection Time: 02/28/24  8:47 AM  Result Value Ref Range   Sodium 138 135 - 145 mmol/L   Potassium 4.3 3.5 - 5.1 mmol/L   Chloride 102 98 - 111 mmol/L   CO2 25 22 - 32 mmol/L   Glucose, Bld 173 (H) 70 - 99 mg/dL    Comment: Glucose reference range applies only to samples taken after  fasting for at least 8 hours.   BUN 18 6 - 20 mg/dL   Creatinine, Ser 9.19 0.61 - 1.24 mg/dL   Calcium 89.7 8.9 - 89.6 mg/dL   Total Protein 7.9 6.5 - 8.1 g/dL   Albumin 4.7 3.5 - 5.0 g/dL   AST 21 15 - 41 U/L   ALT 23 0 - 44 U/L   Alkaline Phosphatase 77 38 - 126 U/L   Total Bilirubin 0.4 0.0 - 1.2 mg/dL   GFR, Estimated >39 >39 mL/min    Comment: (NOTE) Calculated using the CKD-EPI Creatinine Equation (2021)    Anion gap 12 5 - 15    Comment: Performed at [facility], [address] [place], [place] [address]  CBC     Status: Abnormal   Collection Time: 02/28/24  8:47 AM  Result Value Ref Range   WBC 14.2 (H) 4.0 - 10.5 K/uL   RBC 5.40 4.22 - 5.81 MIL/uL   Hemoglobin 16.0 13.0 - 17.0 g/dL   HCT 53.4 60.9 - 47.9  %   MCV 86.1 80.0 - 100.0 fL   MCH 29.6 26.0 - 34.0 pg   MCHC 34.4 30.0 - 36.0 g/dL   RDW 87.9 88.4 - 84.4 %   Platelets 255 150 - 400 K/uL   nRBC 0.0 0.0 - 0.2 %    Comment: Performed at [facility], [address] [place], [place] [address]   CT ABDOMEN PELVIS W CONTRAST Result Date: 02/28/2024 EXAM: CT ABDOMEN AND PELVIS WITH CONTRAST 02/28/2024 09:48:40 AM TECHNIQUE: CT of the abdomen and pelvis was performed with the administration of 100 mL iohexol  (OMNIPAQUE ) 300 MG/ML solution. Multiplanar reformatted images are provided for review. Automated exposure control, iterative reconstruction, and/or weight-based adjustment of the mA/kV was utilized to reduce the radiation dose to as low as reasonably achievable. COMPARISON: Thoracolumbar spine dated 01/17/2007. CLINICAL HISTORY: R sided abd pain, started acutely this AM. FINDINGS: LOWER CHEST: Posterior bibasilar dependent atelectasis. LIVER: Subcentimeter hypodensity of the left hepatic lobe, too small to definitively characterize, but likely a small cyst or biliary hematoma. Moderate intrahepatic biliary ductal dilation. GALLBLADDER AND BILE DUCTS: The gallbladder is distended and fluid filled with pericholecystic inflammation. No radiopaque stones visualized. Common bile duct is mildly dilated measuring 9 mm with fairly smooth tapering to the level of the ampulla. SPLEEN: No acute abnormality. PANCREAS: No acute abnormality. ADRENAL GLANDS: No acute abnormality. KIDNEYS, URETERS AND BLADDER: 1 cm cyst in the lower pole of the left kidney. No stones in the kidneys or ureters. No hydronephrosis. No perinephric or periureteral stranding. The urinary bladder is completely decompressed. GI AND BOWEL: Stomach demonstrates no acute abnormality. There is no bowel obstruction. Normal appendix. PERITONEUM AND RETROPERITONEUM: No ascites. No free air. VASCULATURE: Aorta is normal in caliber. Scattered aortoiliac atherosclerosis. LYMPH  NODES: No lymphadenopathy. REPRODUCTIVE ORGANS: No prostatomegaly. BONES AND SOFT TISSUES: Small fat containing umbilical hernia. Mild multilevel degenerative changes. No acute osseous abnormality. No focal soft tissue abnormality. IMPRESSION: 1. Fluid-filled, distended gallbladder with pericholecystic inflammation. No wall thickening or radiopaque gallstones. This may represent changes of early acute cholecystitis and a nuclear medicine compatibility scan is recommended for further characterization. 2. Moderate intrahepatic biliary ductal dilation with mild common bile duct dilation to 9 mm. While no radiopaque choledocholithiasis is visualized, the changes in the gallbladder and the biliary duct dilation may reflect changes of obstructive choledocholithiasis. A multiphase abdominal MRI with iv contrast is recommended. The gallbladder can also be assessed on that study  for possible cholecystitis. Electronically signed by: Rogelia Myers MD 02/28/2024 10:05 AM EST RP Workstation: [id number]    Review of Systems  Constitutional: Negative.   HENT: Negative.    Eyes: Negative.   Respiratory: Negative.    Cardiovascular: Negative.   Gastrointestinal:  Positive for abdominal pain. Negative for vomiting.  Endocrine: Negative.   Genitourinary: Negative.   Musculoskeletal:  Positive for back pain.  Skin: Negative.   Allergic/Immunologic: Negative.   Neurological: Negative.   Hematological: Negative.   Psychiatric/Behavioral: Negative.      Blood pressure (!) 189/81, pulse (!) 58, temperature 98 F (36.7 C), temperature source Oral, resp. rate 19, SpO2 98%. Physical Exam HENT:     Head: Normocephalic.  Cardiovascular:     Rate and Rhythm: Normal rate and regular rhythm.  Pulmonary:     Effort: Pulmonary effort is normal.     Breath sounds: Normal breath sounds. No wheezing.  Abdominal:     General: There is no distension.     Palpations: Abdomen is soft.     Tenderness: There is abdominal  tenderness in the right upper quadrant. There is no guarding or rebound.  Skin:    General: Skin is warm.  Neurological:     Mental Status: He is alert and oriented to person, place, and time.  Psychiatric:        Mood and Affect: Mood normal.      Assessment/Plan Likely cholecystitis, mildly dilated CBD with normal LFTs -somewhat unusual presentation with no gallstones noted on CT.  Will obtain right upper quadrant ultrasound for further evaluation. Likely laparoscopic cholecystectomy in AM pending U/S and F/U labs. I discussed the plan with him and his wife.  Dann FORBES Hummer, MD 02/28/2024, 3:33 PM       [1] No Known Allergies

## 2024-02-28 NOTE — Progress Notes (Signed)
 Pt transfer from [facility]. Pt sitting EOB in pain 10/10. Charge RN did assessment, pt receive pain medication.

## 2024-02-28 NOTE — Plan of Care (Signed)
  Problem: Activity: Goal: Risk for activity intolerance will decrease Outcome: Progressing   Problem: Coping: Goal: Level of anxiety will decrease Outcome: Not Progressing   Problem: Pain Managment: Goal: General experience of comfort will improve and/or be controlled Outcome: Not Progressing

## 2024-02-28 NOTE — ED Provider Notes (Signed)
 " [place] EMERGENCY DEPARTMENT AT [facility] Provider Note   CSN: [id number] Arrival date & time: 02/28/24  9170     Patient presents with: Abdominal Pain   Joshua [name] is a 48 y.o. male.   Patient on chronic narcotics due to low back pain -- presents to the emergency department today for evaluation of right sided abdominal pain.  Symptoms started in the early morning hours.  Patient states that he ate too much last night and was uncomfortable and then the pain worsened.  Pain is to the right lateral and right upper quadrant with radiation to the right back area.  He had 1 episode of vomiting.  Pain does not radiate to the groin.  No urinary symptoms.  No fevers.  No chest pain or shortness of breath.  Denies previous abdominal surgeries.       Prior to Admission medications  Medication Sig Start Date End Date Taking? Authorizing Provider  morphine  (MS CONTIN ) 30 MG 12 hr tablet Take 1 tablet (30 mg total) by mouth every 12 (twelve) hours. 02/02/24   Debby Fidela CROME, NP  oxyCODONE -acetaminophen  (PERCOCET) 7.5-325 MG tablet Take 1 tablet by mouth every 8 (eight) hours as needed. 02/02/24   Debby Fidela CROME, NP    Allergies: Patient has no known allergies.    Review of Systems  Updated Vital Signs BP (!) 175/91   Pulse (!) 58   Temp 98.1 F (36.7 C)   Resp 20   SpO2 97%   Physical Exam Vitals and nursing note reviewed.  Constitutional:      General: He is in acute distress.     Appearance: He is well-developed.     Comments: Patient appears uncomfortable, wincing in bed  HENT:     Head: Normocephalic and atraumatic.  Eyes:     General:        Right eye: No discharge.        Left eye: No discharge.     Conjunctiva/sclera: Conjunctivae normal.  Cardiovascular:     Rate and Rhythm: Normal rate and regular rhythm.     Heart sounds: Normal heart sounds.  Pulmonary:     Effort: Pulmonary effort is normal.     Breath sounds: Normal breath sounds.   Abdominal:     Palpations: Abdomen is soft.     Tenderness: There is abdominal tenderness in the right upper quadrant, epigastric area and periumbilical area. There is no guarding or rebound. Negative signs include Murphy's sign and McBurney's sign.     Comments: Diastases recti noted  Musculoskeletal:     Cervical back: Normal range of motion and neck supple.  Skin:    General: Skin is warm and dry.  Neurological:     Mental Status: He is alert.     (all labs ordered are listed, but only abnormal results are displayed) Labs Reviewed  COMPREHENSIVE METABOLIC PANEL WITH GFR - Abnormal; Notable for the following components:      Result Value   Glucose, Bld 173 (*)    All other components within normal limits  CBC - Abnormal; Notable for the following components:   WBC 14.2 (*)    All other components within normal limits  URINALYSIS, ROUTINE W REFLEX MICROSCOPIC - Abnormal; Notable for the following components:   Hgb urine dipstick SMALL (*)    All other components within normal limits  LIPASE, BLOOD  HIV ANTIBODY (ROUTINE TESTING W REFLEX)    EKG: None  Radiology: CT ABDOMEN  PELVIS W CONTRAST Result Date: 02/28/2024 EXAM: CT ABDOMEN AND PELVIS WITH CONTRAST 02/28/2024 09:48:40 AM TECHNIQUE: CT of the abdomen and pelvis was performed with the administration of 100 mL iohexol  (OMNIPAQUE ) 300 MG/ML solution. Multiplanar reformatted images are provided for review. Automated exposure control, iterative reconstruction, and/or weight-based adjustment of the mA/kV was utilized to reduce the radiation dose to as low as reasonably achievable. COMPARISON: Thoracolumbar spine dated 01/17/2007. CLINICAL HISTORY: R sided abd pain, started acutely this AM. FINDINGS: LOWER CHEST: Posterior bibasilar dependent atelectasis. LIVER: Subcentimeter hypodensity of the left hepatic lobe, too small to definitively characterize, but likely a small cyst or biliary hematoma. Moderate intrahepatic biliary  ductal dilation. GALLBLADDER AND BILE DUCTS: The gallbladder is distended and fluid filled with pericholecystic inflammation. No radiopaque stones visualized. Common bile duct is mildly dilated measuring 9 mm with fairly smooth tapering to the level of the ampulla. SPLEEN: No acute abnormality. PANCREAS: No acute abnormality. ADRENAL GLANDS: No acute abnormality. KIDNEYS, URETERS AND BLADDER: 1 cm cyst in the lower pole of the left kidney. No stones in the kidneys or ureters. No hydronephrosis. No perinephric or periureteral stranding. The urinary bladder is completely decompressed. GI AND BOWEL: Stomach demonstrates no acute abnormality. There is no bowel obstruction. Normal appendix. PERITONEUM AND RETROPERITONEUM: No ascites. No free air. VASCULATURE: Aorta is normal in caliber. Scattered aortoiliac atherosclerosis. LYMPH NODES: No lymphadenopathy. REPRODUCTIVE ORGANS: No prostatomegaly. BONES AND SOFT TISSUES: Small fat containing umbilical hernia. Mild multilevel degenerative changes. No acute osseous abnormality. No focal soft tissue abnormality. IMPRESSION: 1. Fluid-filled, distended gallbladder with pericholecystic inflammation. No wall thickening or radiopaque gallstones. This may represent changes of early acute cholecystitis and a nuclear medicine compatibility scan is recommended for further characterization. 2. Moderate intrahepatic biliary ductal dilation with mild common bile duct dilation to 9 mm. While no radiopaque choledocholithiasis is visualized, the changes in the gallbladder and the biliary duct dilation may reflect changes of obstructive choledocholithiasis. A multiphase abdominal MRI with iv contrast is recommended. The gallbladder can also be assessed on that study for possible cholecystitis. Electronically signed by: Rogelia Myers MD 02/28/2024 10:05 AM EST RP Workstation: [id number]     Procedures   Medications Ordered in the ED  cefTRIAXone  (ROCEPHIN ) 2 g in sodium chloride  0.9 %  100 mL IVPB (has no administration in time range)  acetaminophen  (TYLENOL ) tablet 1,000 mg (has no administration in time range)  HYDROmorphone  (DILAUDID ) injection 1 mg (has no administration in time range)  oxyCODONE  (Oxy IR/ROXICODONE ) immediate release tablet 5-10 mg (has no administration in time range)  diphenhydrAMINE  (BENADRYL ) capsule 25 mg (has no administration in time range)    Or  diphenhydrAMINE  (BENADRYL ) injection 25 mg (has no administration in time range)  senna (SENOKOT) tablet 8.6 mg (has no administration in time range)  ondansetron  (ZOFRAN -ODT) disintegrating tablet 4 mg (has no administration in time range)    Or  ondansetron  (ZOFRAN ) injection 4 mg (has no administration in time range)  lactated ringers  infusion (has no administration in time range)  morphine  (MS CONTIN ) 12 hr tablet 30 mg (has no administration in time range)  ondansetron  (ZOFRAN ) injection 4 mg (4 mg Intravenous Given 02/28/24 0924)  HYDROmorphone  (DILAUDID ) injection 1 mg (1 mg Intravenous Given 02/28/24 0924)  iohexol  (OMNIPAQUE ) 300 MG/ML solution 100 mL (100 mLs Intravenous Contrast Given 02/28/24 0941)  HYDROmorphone  (DILAUDID ) injection 1 mg (1 mg Intravenous Given 02/28/24 1052)   ED Course  Patient seen and examined. History obtained directly from patient and family member  at bedside.  Labs/EKG: Ordered CBC, CMP, lipase, UA.  Imaging: Ordered CT abdomen pelvis.  Medications/Fluids: Ordered: Hydromorphone , Zofran .   Most recent vital signs reviewed and are as follows: BP (!) 175/91   Pulse (!) 58   Temp 98.1 F (36.7 C)   Resp 20   SpO2 97%   Initial impression: Right-sided abdominal pain, possibly gallbladder, possibly renal colic.  Will start with CT as this will give more information than just an ultrasound.  Could need ultrasound if we need to look at the gallbladder more closely.  10:54 AM Reassessment performed. Patient appears uncomfortable after having some  improvement.  Labs personally reviewed and interpreted including: CBC with elevated white blood cell count, normal hemoglobin; CMP glucose 173 otherwise no concerning findings, normal LFTs; lipase normal; UA unremarkable.  Imaging personally visualized and interpreted including: CT demonstrates abnormal gallbladder, distended, pericholecystic inflammation.    Reviewed pertinent lab work and imaging with patient at bedside. Questions answered.   Discussed case with [place] PA-C, with general surgery, recommends admission to [facility] for surgical evaluation.  Most current vital signs reviewed and are as follows: BP (!) 168/88   Pulse (!) 54   Temp 98.1 F (36.7 C)   Resp 18   SpO2 98%   Plan: Admit to hospital.   Additional pain medication ordered, will give IV antibiotics.                                   Medical Decision Making Amount and/or Complexity of Data Reviewed Labs: ordered. Radiology: ordered.  Risk Prescription drug management. Decision regarding hospitalization.   For this patient's complaint of abdominal pain, the following conditions were considered on the differential diagnosis: gastritis/PUD, enteritis/duodenitis, appendicitis, cholelithiasis/cholecystitis, cholangitis, pancreatitis, ruptured viscus, colitis, diverticulitis, small/large bowel obstruction, proctitis, cystitis, pyelonephritis, ureteral colic, aortic dissection, aortic aneurysm. Atypical chest etiologies were also considered including ACS, PE, and pneumonia.  CT concerning for gallbladder disease.  This would be clinically consistent with the patient's symptoms.  Surgical consultation obtained, plan for admission.     Final diagnoses:  Right upper quadrant pain  Acute cholecystitis    ED Discharge Orders     None          Desiderio Chew, PA-C 02/28/24 1057    Ruthe Cornet, DO 02/28/24 1156  "

## 2024-02-29 ENCOUNTER — Observation Stay (HOSPITAL_COMMUNITY): Admitting: Anesthesiology

## 2024-02-29 ENCOUNTER — Encounter (HOSPITAL_COMMUNITY): Payer: Self-pay

## 2024-02-29 ENCOUNTER — Encounter (HOSPITAL_COMMUNITY): Admission: EM | Disposition: A | Payer: Self-pay | Source: Home / Self Care | Attending: Emergency Medicine

## 2024-02-29 ENCOUNTER — Observation Stay (HOSPITAL_COMMUNITY)

## 2024-02-29 DIAGNOSIS — K8000 Calculus of gallbladder with acute cholecystitis without obstruction: Secondary | ICD-10-CM | POA: Diagnosis not present

## 2024-02-29 HISTORY — PX: CHOLECYSTECTOMY: SHX55

## 2024-02-29 LAB — COMPREHENSIVE METABOLIC PANEL WITH GFR
ALT: 19 U/L (ref 0–44)
AST: 25 U/L (ref 15–41)
Albumin: 4.3 g/dL (ref 3.5–5.0)
Alkaline Phosphatase: 65 U/L (ref 38–126)
Anion gap: 13 (ref 5–15)
BUN: 14 mg/dL (ref 6–20)
CO2: 25 mmol/L (ref 22–32)
Calcium: 9.6 mg/dL (ref 8.9–10.3)
Chloride: 99 mmol/L (ref 98–111)
Creatinine, Ser: 0.79 mg/dL (ref 0.61–1.24)
GFR, Estimated: >60 mL/min
Glucose, Bld: 141 mg/dL — ABNORMAL HIGH (ref 70–99)
Potassium: 4.1 mmol/L (ref 3.5–5.1)
Sodium: 138 mmol/L (ref 135–145)
Total Bilirubin: 0.9 mg/dL (ref 0.0–1.2)
Total Protein: 7.2 g/dL (ref 6.5–8.1)

## 2024-02-29 LAB — CBC
HCT: 45.1 % (ref 39.0–52.0)
Hemoglobin: 15.6 g/dL (ref 13.0–17.0)
MCH: 30 pg (ref 26.0–34.0)
MCHC: 34.6 g/dL (ref 30.0–36.0)
MCV: 86.7 fL (ref 80.0–100.0)
Platelets: 231 K/uL (ref 150–400)
RBC: 5.2 MIL/uL (ref 4.22–5.81)
RDW: 12.2 % (ref 11.5–15.5)
WBC: 20.4 K/uL — ABNORMAL HIGH (ref 4.0–10.5)
nRBC: 0 % (ref 0.0–0.2)

## 2024-02-29 LAB — SURGICAL PCR SCREEN
MRSA, PCR: NEGATIVE
Staphylococcus aureus: NEGATIVE

## 2024-02-29 SURGERY — LAPAROSCOPIC CHOLECYSTECTOMY WITH INTRAOPERATIVE CHOLANGIOGRAM
Anesthesia: General | Site: Abdomen

## 2024-02-29 MED ORDER — FENTANYL CITRATE (PF) 250 MCG/5ML IJ SOLN
INTRAMUSCULAR | Status: DC | PRN
  Administered 2024-02-29 (×3): 50 ug via INTRAVENOUS
  Administered 2024-02-29: 100 ug via INTRAVENOUS

## 2024-02-29 MED ORDER — MIDAZOLAM HCL 2 MG/2ML IJ SOLN
INTRAMUSCULAR | Status: AC
  Filled 2024-02-29: qty 2

## 2024-02-29 MED ORDER — ROCURONIUM BROMIDE 10 MG/ML (PF) SYRINGE
PREFILLED_SYRINGE | INTRAVENOUS | Status: DC | PRN
  Administered 2024-02-29 (×2): 10 mg via INTRAVENOUS
  Administered 2024-02-29: 70 mg via INTRAVENOUS

## 2024-02-29 MED ORDER — SUGAMMADEX SODIUM 200 MG/2ML IV SOLN
INTRAVENOUS | Status: DC | PRN
  Administered 2024-02-29: 300 mg via INTRAVENOUS

## 2024-02-29 MED ORDER — PROPOFOL 10 MG/ML IV BOLUS
INTRAVENOUS | Status: AC
  Filled 2024-02-29: qty 20

## 2024-02-29 MED ORDER — SODIUM CHLORIDE 0.9 % IR SOLN
Status: DC | PRN
  Administered 2024-02-29: 1000 mL

## 2024-02-29 MED ORDER — SODIUM CHLORIDE 0.9 % IV SOLN
INTRAVENOUS | Status: DC | PRN
  Administered 2024-02-29: 15 mL

## 2024-02-29 MED ORDER — KETOROLAC TROMETHAMINE 15 MG/ML IJ SOLN
INTRAMUSCULAR | Status: DC | PRN
  Administered 2024-02-29: 15 mg via INTRAVENOUS

## 2024-02-29 MED ORDER — FENTANYL CITRATE (PF) 100 MCG/2ML IJ SOLN: 25.0000 ug | INTRAMUSCULAR | Status: DC | PRN

## 2024-02-29 MED ORDER — CEFAZOLIN SODIUM-DEXTROSE 2-3 GM-%(50ML) IV SOLR
INTRAVENOUS | Status: DC | PRN
  Administered 2024-02-29: 2 g via INTRAVENOUS

## 2024-02-29 MED ORDER — LACTATED RINGERS IV SOLN: INTRAVENOUS | Status: DC

## 2024-02-29 MED ORDER — CEFAZOLIN SODIUM-DEXTROSE 2-4 GM/100ML-% IV SOLN
INTRAVENOUS | Status: AC
  Filled 2024-02-29: qty 100

## 2024-02-29 MED ORDER — 0.9 % SODIUM CHLORIDE (POUR BTL) OPTIME
TOPICAL | Status: DC | PRN
  Administered 2024-02-29: 100 mL

## 2024-02-29 MED ORDER — OXYCODONE HCL 5 MG PO TABS
ORAL_TABLET | ORAL | Status: AC
  Filled 2024-02-29: qty 1

## 2024-02-29 MED ORDER — LIDOCAINE 2% (20 MG/ML) 5 ML SYRINGE
INTRAMUSCULAR | Status: DC | PRN
  Administered 2024-02-29: 100 mg via INTRAVENOUS

## 2024-02-29 MED ORDER — BUPIVACAINE HCL (PF) 0.25 % IJ SOLN
INTRAMUSCULAR | Status: AC
  Filled 2024-02-29: qty 30

## 2024-02-29 MED ORDER — NICOTINE 14 MG/24HR TD PT24
14.0000 mg | MEDICATED_PATCH | Freq: Every day | TRANSDERMAL | Status: DC
  Administered 2024-02-29 – 2024-03-01 (×2): 14 mg via TRANSDERMAL
  Filled 2024-02-29 (×2): qty 1

## 2024-02-29 MED ORDER — BUPIVACAINE HCL 0.25 % IJ SOLN
INTRAMUSCULAR | Status: DC | PRN
  Administered 2024-02-29: 30 mL

## 2024-02-29 MED ORDER — ALBUTEROL SULFATE HFA 108 (90 BASE) MCG/ACT IN AERS
INHALATION_SPRAY | RESPIRATORY_TRACT | Status: DC | PRN
  Administered 2024-02-29: 2 via RESPIRATORY_TRACT

## 2024-02-29 MED ORDER — OXYCODONE HCL 5 MG/5ML PO SOLN: 5.0000 mg | Freq: Once | ORAL | Status: AC | PRN

## 2024-02-29 MED ORDER — ACETAMINOPHEN 10 MG/ML IV SOLN: 1000.0000 mg | Freq: Once | INTRAVENOUS | Status: DC | PRN

## 2024-02-29 MED ORDER — DEXAMETHASONE SOD PHOSPHATE PF 10 MG/ML IJ SOLN
INTRAMUSCULAR | Status: DC | PRN
  Administered 2024-02-29: 10 mg via INTRAVENOUS

## 2024-02-29 MED ORDER — KETOROLAC TROMETHAMINE 30 MG/ML IJ SOLN: 30.0000 mg | Freq: Three times a day (TID) | INTRAMUSCULAR | Status: DC | PRN

## 2024-02-29 MED ORDER — ONDANSETRON HCL 4 MG/2ML IJ SOLN
INTRAMUSCULAR | Status: DC | PRN
  Administered 2024-02-29: 4 mg via INTRAVENOUS

## 2024-02-29 MED ORDER — CHLORHEXIDINE GLUCONATE 0.12 % MT SOLN
15.0000 mL | Freq: Once | OROMUCOSAL | Status: AC
  Administered 2024-02-29: 15 mL via OROMUCOSAL

## 2024-02-29 MED ORDER — FENTANYL CITRATE (PF) 250 MCG/5ML IJ SOLN
INTRAMUSCULAR | Status: AC
  Filled 2024-02-29: qty 5

## 2024-02-29 MED ORDER — ORAL CARE MOUTH RINSE: 15.0000 mL | Freq: Once | OROMUCOSAL | Status: AC

## 2024-02-29 MED ORDER — PROPOFOL 10 MG/ML IV BOLUS
INTRAVENOUS | Status: DC | PRN
  Administered 2024-02-29: 200 mg via INTRAVENOUS

## 2024-02-29 MED ORDER — DROPERIDOL 2.5 MG/ML IJ SOLN: 0.6250 mg | Freq: Once | INTRAMUSCULAR | Status: DC | PRN

## 2024-02-29 MED ORDER — OXYCODONE HCL 5 MG PO TABS
5.0000 mg | ORAL_TABLET | Freq: Once | ORAL | Status: AC | PRN
  Administered 2024-02-29: 5 mg via ORAL

## 2024-02-29 MED ORDER — HYDROMORPHONE HCL 1 MG/ML IJ SOLN
INTRAMUSCULAR | Status: AC
  Filled 2024-02-29: qty 0.5

## 2024-02-29 MED ORDER — PHENYLEPHRINE 80 MCG/ML (10ML) SYRINGE FOR IV PUSH (FOR BLOOD PRESSURE SUPPORT)
PREFILLED_SYRINGE | INTRAVENOUS | Status: DC | PRN
  Administered 2024-02-29 (×2): 80 ug via INTRAVENOUS

## 2024-02-29 MED ORDER — HYDROMORPHONE HCL 1 MG/ML IJ SOLN
INTRAMUSCULAR | Status: DC | PRN
  Administered 2024-02-29: .5 mg via INTRAVENOUS

## 2024-02-29 SURGICAL SUPPLY — 37 items
BLADE CLIPPER SURG (BLADE) IMPLANT
CANISTER SUCTION 3000ML PPV (SUCTIONS) ×1 IMPLANT
CHLORAPREP W/TINT 26 (MISCELLANEOUS) ×1 IMPLANT
CLIP APPLIE ROT 10 11.4 M/L (STAPLE) IMPLANT
CLIP LIGATING HEMO O LOK GREEN (MISCELLANEOUS) IMPLANT
COVER MAYO STAND STRL (DRAPES) ×1 IMPLANT
COVER SURGICAL LIGHT HANDLE (MISCELLANEOUS) ×1 IMPLANT
DERMABOND ADVANCED .7 DNX12 (GAUZE/BANDAGES/DRESSINGS) ×1 IMPLANT
DRAPE C-ARM 42X120 X-RAY (DRAPES) ×1 IMPLANT
ELECTRODE REM PT RTRN 9FT ADLT (ELECTROSURGICAL) ×1 IMPLANT
GLOVE BIOGEL PI IND STRL 7.0 (GLOVE) ×1 IMPLANT
GLOVE SURG SS PI 7.0 STRL IVOR (GLOVE) ×1 IMPLANT
GOWN STRL REUS W/ TWL LRG LVL3 (GOWN DISPOSABLE) ×3 IMPLANT
GRASPER SUT TROCAR 14GX15 (MISCELLANEOUS) ×1 IMPLANT
IRRIGATION SUCT STRKRFLW 2 WTP (MISCELLANEOUS) ×1 IMPLANT
KIT BASIN OR (CUSTOM PROCEDURE TRAY) ×1 IMPLANT
KIT TURNOVER KIT B (KITS) ×1 IMPLANT
NDL 22X1.5 STRL (OR ONLY) (MISCELLANEOUS) ×1 IMPLANT
NDL HYPO 22X1.5 SAFETY MO (MISCELLANEOUS) ×1 IMPLANT
NEEDLE 22X1.5 STRL (OR ONLY) (MISCELLANEOUS) ×1 IMPLANT
NEEDLE HYPO 22X1.5 SAFETY MO (MISCELLANEOUS) ×1 IMPLANT
PAD ARMBOARD POSITIONER FOAM (MISCELLANEOUS) ×1 IMPLANT
POUCH RETRIEVAL ECOSAC 10 (ENDOMECHANICALS) ×1 IMPLANT
SCISSORS LAP 5X35 DISP (ENDOMECHANICALS) ×1 IMPLANT
SET CHOLANGIOGRAPH 5 50 .035 (SET/KITS/TRAYS/PACK) ×1 IMPLANT
SET TUBE SMOKE EVAC HIGH FLOW (TUBING) ×1 IMPLANT
SLEEVE Z-THREAD 5X100MM (TROCAR) ×2 IMPLANT
SOLN 0.9% NACL POUR BTL 1000ML (IV SOLUTION) ×1 IMPLANT
SOLN STERILE WATER BTL 1000 ML (IV SOLUTION) ×1 IMPLANT
SUT MNCRL AB 4-0 PS2 18 (SUTURE) ×1 IMPLANT
SUT VICRYL 0 UR6 27IN ABS (SUTURE) IMPLANT
TOWEL GREEN STERILE (TOWEL DISPOSABLE) ×1 IMPLANT
TOWEL GREEN STERILE FF (TOWEL DISPOSABLE) ×1 IMPLANT
TRAY LAPAROSCOPIC MC (CUSTOM PROCEDURE TRAY) ×1 IMPLANT
TROCAR Z THREAD OPTICAL 12X100 (TROCAR) ×1 IMPLANT
TROCAR Z-THREAD OPTICAL 5X100M (TROCAR) ×1 IMPLANT
WARMER LAPAROSCOPE (MISCELLANEOUS) ×1 IMPLANT

## 2024-02-29 NOTE — Progress Notes (Signed)
 Pt received CHG bath, teeth were brushed, urinated and PCR swab was sent for procedure.

## 2024-02-29 NOTE — Progress Notes (Signed)
.. ° ° °  PROCEDURAL EXPEDITER PROGRESS NOTE  Patient Name: Joshua [name]  DOB:1975/08/24 Date of Admission: 02/28/2024  Date of Assessment:02/29/2024   -------------------------------------------------------------------------------------------------------------------   Brief clinical summary: Pt to OR today for laparoscopic cholecystectomy   Orders in place:  Yes - consent order not placed. Will be signed in Pre-op  Communication with surgical team if no orders: N/A  Labs, test, and orders reviewed: Y - Surgeon to review U/S and morning labs to determine is pt is a candidate for surgery.   Requires surgical clearance:  No  Barriers noted: N/A   Intervention provided by [facility] team: N/A  Barrier resolved:  not applicable   -------------------------------------------------------------------------------------------------------------------  [employer], [place], [place] Please contact us  directly via secure chat (search for [facility]) or by calling us  at [phone] [facility]).

## 2024-02-29 NOTE — Progress Notes (Signed)
" °*   Day of Surgery *   Chief Complaint/Subjective: Continued RUQ abdominal pain radiating to the back. This morning also worse in the lower abdomen  Objective: Vital signs in last 24 hours: Temp:  [97.9 F (36.6 C)-99.2 F (37.3 C)] 99.2 F (37.3 C) (12/21 0417) Pulse Rate:  [53-69] 69 (12/21 0417) Resp:  [18-20] 18 (12/21 0417) BP: (134-189)/(76-96) 134/80 (12/21 0417) SpO2:  [97 %-100 %] 98 % (12/21 0417) Last BM Date : 02/27/24 Intake/Output from previous day: 12/20 0701 - 12/21 0700 In: 1390.1 [P.O.:25; I.V.:1265.4; IV Piggyback:99.7] Out: -   PE: Gen: NAD Resp: nonlabored Card: RRR Abd: soft, TTP RuQ  Lab Results:  Recent Labs    02/28/24 0847 02/29/24 0433  WBC 14.2* 20.4*  HGB 16.0 15.6  HCT 46.5 45.1  PLT 255 231   Recent Labs    02/28/24 0847 02/29/24 0433  NA 138 138  K 4.3 4.1  CL 102 99  CO2 25 25  GLUCOSE 173* 141*  BUN 18 14  CREATININE 0.80 0.79  CALCIUM 10.2 9.6   No results for input(s): LABPROT, INR in the last 72 hours.    Component Value Date/Time   NA 138 02/29/2024 0433   NA 138 10/23/2020 1149   K 4.1 02/29/2024 0433   CL 99 02/29/2024 0433   CO2 25 02/29/2024 0433   GLUCOSE 141 (H) 02/29/2024 0433   BUN 14 02/29/2024 0433   BUN 17 10/23/2020 1149   CREATININE 0.79 02/29/2024 0433   CALCIUM 9.6 02/29/2024 0433   PROT 7.2 02/29/2024 0433   PROT 6.8 10/23/2020 1149   ALBUMIN 4.3 02/29/2024 0433   ALBUMIN 4.4 10/23/2020 1149   AST 25 02/29/2024 0433   ALT 19 02/29/2024 0433   ALKPHOS 65 02/29/2024 0433   BILITOT 0.9 02/29/2024 0433   BILITOT 0.4 10/23/2020 1149   GFRNONAA >60 02/29/2024 0433   GFRAA 128 07/14/2017 1606    Assessment/Plan  s/p Procedures: LAPAROSCOPIC CHOLECYSTECTOMY WITH INTRAOPERATIVE CHOLANGIOGRAM 02/29/2024  -OR today We discussed the etiology of her pain, we discussed treatment options and recommended surgery. We discussed details of surgery including general anesthesia, laparoscopic approach,  identification of cystic duct and common bile duct. Ligation of cystic duct and cystic artery. Possible need for intraoperative cholangiogram or open procedure. Possible risks of common bile duct injury, liver injury, cystic duct leak, bleeding, infection, post-cholecystectomy syndrome. The patient showed good understanding and all questions were answered   FEN - NPO VTE - heparin post op ID - ceftriaxone  Disposition - inpatient   LOS: 0 days   I reviewed last 24 h vitals and pain scores, last 48 h intake and output, last 24 h labs and trends, and last 24 h imaging results.  This care required high  level of medical decision making.   Herlene Righter [facility] at [facility] 02/29/2024, 8:14 AM Please see Amion for pager number during day hours 7:00am-4:30pm or 7:00am -11:30am on weekends   "

## 2024-02-29 NOTE — Anesthesia Procedure Notes (Signed)
 Procedure Name: Intubation Date/Time: 02/29/2024 8:40 AM  Performed by: Vertie Arthea RAMAN, CRNAPre-anesthesia Checklist: Patient identified, Emergency Drugs available, Suction available and Patient being monitored Patient Re-evaluated:Patient Re-evaluated prior to induction Oxygen Delivery Method: Circle System Utilized Preoxygenation: Pre-oxygenation with 100% oxygen Induction Type: IV induction Ventilation: Oral airway inserted - appropriate to patient size and Two handed mask ventilation required Laryngoscope Size: Miller and 2 Grade View: Grade I Tube type: Oral Tube size: 7.5 mm Number of attempts: 1 Airway Equipment and Method: Stylet and Oral airway Placement Confirmation: ETT inserted through vocal cords under direct vision, positive ETCO2 and breath sounds checked- equal and bilateral Tube secured with: Tape Dental Injury: Teeth and Oropharynx as per pre-operative assessment

## 2024-02-29 NOTE — Plan of Care (Signed)
  Problem: Clinical Measurements: Goal: Respiratory complications will improve Outcome: Progressing   Problem: Activity: Goal: Risk for activity intolerance will decrease Outcome: Progressing   Problem: Nutrition: Goal: Adequate nutrition will be maintained Outcome: Progressing   Problem: Elimination: Goal: Will not experience complications related to urinary retention Outcome: Progressing   Problem: Pain Managment: Goal: General experience of comfort will improve and/or be controlled Outcome: Progressing

## 2024-02-29 NOTE — Anesthesia Postprocedure Evaluation (Signed)
"   Anesthesia Post Note  Patient: Joshua [name]  Procedure(s) Performed: LAPAROSCOPIC CHOLECYSTECTOMY WITH INTRAOPERATIVE CHOLANGIOGRAM (Abdomen)     Patient location during evaluation: PACU Anesthesia Type: General Level of consciousness: awake and alert Pain management: pain level controlled Vital Signs Assessment: post-procedure vital signs reviewed and stable Respiratory status: spontaneous breathing, nonlabored ventilation, respiratory function stable and patient connected to nasal cannula oxygen Cardiovascular status: blood pressure returned to baseline and stable Postop Assessment: no apparent nausea or vomiting Anesthetic complications: no   No notable events documented.  Last Vitals:  Vitals:   02/29/24 1032 02/29/24 1048  BP:  (!) 144/65  Pulse:  65  Resp:  18  Temp: 36.4 C 36.8 C  SpO2: 93% 94%    Last Pain:  Vitals:   02/29/24 1230  TempSrc:   PainSc: 5                  [name]      "

## 2024-02-29 NOTE — Anesthesia Preprocedure Evaluation (Addendum)
"                                    Anesthesia Evaluation  Patient identified by MRN, date of birth, ID band Patient awake    Reviewed: Allergy & Precautions, H&P , NPO status , Patient's Chart, lab work & pertinent test results  Airway Mallampati: III  TM Distance: >3 FB Neck ROM: Full    Dental  (+) Poor Dentition, Missing, Dental Advisory Given   Pulmonary sleep apnea , Current Smoker   Pulmonary exam normal breath sounds clear to auscultation       Cardiovascular (-) hypertension(-) angina (-) Past MI negative cardio ROS Normal cardiovascular exam Rhythm:Regular Rate:Normal     Neuro/Psych neg Seizures  Neuromuscular disease  negative psych ROS   GI/Hepatic Neg liver ROS,neg GERD  ,,cholecystitis   Endo/Other  negative endocrine ROS    Renal/GU negative Renal ROS  negative genitourinary   Musculoskeletal negative musculoskeletal ROS (+)    Abdominal   Peds negative pediatric ROS (+)  Hematology negative hematology ROS (+)   Anesthesia Other Findings   Reproductive/Obstetrics negative OB ROS                              Anesthesia Physical Anesthesia Plan  ASA: 2  Anesthesia Plan: General   Post-op Pain Management: Tylenol  PO (pre-op)*   Induction: Intravenous  PONV Risk Score and Plan: 1 and Ondansetron , Dexamethasone , Midazolam  and Treatment may vary due to age or medical condition  Airway Management Planned: Oral ETT  Additional Equipment: None  Intra-op Plan:   Post-operative Plan: Extubation in OR  Informed Consent: I have reviewed the patients History and Physical, chart, labs and discussed the procedure including the risks, benefits and alternatives for the proposed anesthesia with the patient or authorized representative who has indicated his/her understanding and acceptance.     Dental advisory given  Plan Discussed with: CRNA  Anesthesia Plan Comments:          Anesthesia Quick  Evaluation  "

## 2024-02-29 NOTE — Plan of Care (Signed)

## 2024-02-29 NOTE — Transfer of Care (Signed)
 Immediate Anesthesia Transfer of Care Note  Patient: Joshua [name]  Procedure(s) Performed: LAPAROSCOPIC CHOLECYSTECTOMY WITH INTRAOPERATIVE CHOLANGIOGRAM (Abdomen)  Patient Location: PACU  Anesthesia Type:General  Level of Consciousness: awake, alert , and oriented  Airway & Oxygen Therapy: Patient Spontanous Breathing and Patient connected to face mask oxygen  Post-op Assessment: Report given to RN and Post -op Vital signs reviewed and stable  Post vital signs: Reviewed and stable  Last Vitals:  Vitals Value Taken Time  BP    Temp    Pulse    Resp    SpO2      Last Pain:  Vitals:   02/29/24 0818  TempSrc: Oral  PainSc: 0-No pain         Complications: No notable events documented.

## 2024-02-29 NOTE — Op Note (Signed)
 PATIENT:  Joshua [name]  48 y.o. male  PRE-OPERATIVE DIAGNOSIS:  CHOLECYSTITIS  POST-OPERATIVE DIAGNOSIS:  CHOLECYSTITIS  PROCEDURE:  Procedures: LAPAROSCOPIC CHOLECYSTECTOMY WITH INTRAOPERATIVE CHOLANGIOGRAM   SURGEON:  Jeweline Reif, Herlene Righter, MD   ASSISTANT: none  ANESTHESIA:   local and general  Indications for procedure: [name] is a 48 y.o. male with symptoms of Abdominal pain and Nausea and vomiting consistent with gallbladder disease, Confirmed by ultrasound.  Description of procedure: The patient was brought into the operative suite, placed supine. Anesthesia was administered with endotracheal tube. Patient was strapped in place and foot board was secured. All pressure points were offloaded by foam padding. The patient was prepped and draped in the usual sterile fashion.  A periumbilical incision was made and optical entry was used to enter the abdomen. 2 5 mm trocars were placed on in the right lateral space on in the right subcostal space. A 12mm trocar was placed in the subxiphoid space. Marcaine  was infused to the subxiphoid space and lateral upper right abdomen in the transversus abdominis plane. Next the patient was placed in reverse trendelenberg. The gallbladder appearedacutely inflamed and gangrenous. Omentum was adhered to the gallbladder and was taken down with cautery/blunt dissection.  The gallbladder was retracted cephalad and lateral. The peritoneum was reflected off the infundibulum working lateral to medial. The cystic duct and cystic artery were identified and further dissection revealed a critical view, due to concern for choledocholithiasis a cholangiogram was performed with ductotomy and cook catheter passed through a separate subcostal stab incision. Normal ductal anatomy was seen with emptying into the duodenum. The cystic duct and cystic artery were doubly clipped and ligated.   The gallbladder was removed off the liver bed with cautery. The  Gallbladder was placed in a specimen bag. The gallbladder fossa was irrigated and hemostasis was applied with cautery. The gallbladder was removed via the 12mm trocar. The fascial defect was closed with interrupted 0 vicryl suture via laparoscopic trans-fascial suture passer. Pneumoperitoneum was removed, all trocar were removed. All incisions were closed with 4-0 monocryl subcuticular stitch. The patient woke from anesthesia and was brought to PACU in stable condition. All counts were correct  Findings: acute calculous cholecystitis, normal ductal anatomy  Specimen: gallbladder  Blood loss: 100 ml  Local anesthesia: 30 ml Marcaine   Complications: none  PLAN OF CARE: Admit for overnight observation  PATIENT DISPOSITION:  PACU - hemodynamically stable.  Herlene Righter [facility], [place]

## 2024-03-01 ENCOUNTER — Encounter (HOSPITAL_COMMUNITY): Payer: Self-pay | Admitting: General Surgery

## 2024-03-01 LAB — CBC
HCT: 43.4 % (ref 39.0–52.0)
Hemoglobin: 14.4 g/dL (ref 13.0–17.0)
MCH: 29.3 pg (ref 26.0–34.0)
MCHC: 33.2 g/dL (ref 30.0–36.0)
MCV: 88.2 fL (ref 80.0–100.0)
Platelets: 202 K/uL (ref 150–400)
RBC: 4.92 MIL/uL (ref 4.22–5.81)
RDW: 12.5 % (ref 11.5–15.5)
WBC: 20.9 K/uL — ABNORMAL HIGH (ref 4.0–10.5)
nRBC: 0 % (ref 0.0–0.2)

## 2024-03-01 LAB — COMPREHENSIVE METABOLIC PANEL WITH GFR
ALT: 89 U/L — ABNORMAL HIGH (ref 0–44)
AST: 98 U/L — ABNORMAL HIGH (ref 15–41)
Albumin: 3.9 g/dL (ref 3.5–5.0)
Alkaline Phosphatase: 63 U/L (ref 38–126)
Anion gap: 10 (ref 5–15)
BUN: 17 mg/dL (ref 6–20)
CO2: 26 mmol/L (ref 22–32)
Calcium: 8.9 mg/dL (ref 8.9–10.3)
Chloride: 103 mmol/L (ref 98–111)
Creatinine, Ser: 0.86 mg/dL (ref 0.61–1.24)
GFR, Estimated: >60 mL/min
Glucose, Bld: 156 mg/dL — ABNORMAL HIGH (ref 70–99)
Potassium: 4.3 mmol/L (ref 3.5–5.1)
Sodium: 139 mmol/L (ref 135–145)
Total Bilirubin: 0.6 mg/dL (ref 0.0–1.2)
Total Protein: 6.6 g/dL (ref 6.5–8.1)

## 2024-03-01 NOTE — Discharge Summary (Signed)
 Physician Discharge Summary   Patient ID: Joshua [name] [id number] 48 y.o. 1975/11/27  Admit date: 02/28/2024  Discharge date and time: 03/01/2024 10:49 AM   Admitting Physician: Rolla Crimes, MD   Discharge Physician: Leonor Dawn, MD  Admission Diagnoses: Acute cholecystitis [K81.0] Cholecystitis [K81.9] Right upper quadrant pain [R10.11]  Discharge Diagnoses: Acute cholecystitis s/p laparoscopic cholecystectomy  Admission Condition: stable  Discharged Condition: good  Indication for Admission: Joshua [name] is a 48 yo male who presented to the ED with RUQ abdominal pain and leukocytosis. A CT scan showed signs of acute cholecystitis.   Hospital Course: The patient was admitted to the general surgery service and started on antibiotics. A RUQ US  showed a stone in the gallbladder neck with dilation of the common bile duct to 11mm. The patient was taken to the OR by Dr. Stevie on 11/21 for a laparoscopic cholecystectomy with intraoperative cholangiogram. The gallbladder was gangrenous, and IOC did not show any common bile duct stones. Please see op report for further details. The patient's bilirubin remained normal on POD1. He was able to tolerate a regular diet and pain was well-controlled. He remained hemodynamically stable. He was examined and deemed appropriate for discharge home.  Consults: None  Treatments: IV hydration, antibiotics: ceftriaxone , analgesia: acetaminophen , Dilaudid , and oxycodone , and surgery: laparoscopic cholecystectomy with intraoperative cholangiogram  Discharge Exam: General: sitting up in bed, NAD Neuro: alert and oriented, no focal deficits Resp: normal work of breathing on room air Abdomen: soft, nondistended, nontender to palpation. Incisions clean and dry with no erythema or induration. Extremities: warm and well-perfused   Disposition: Discharge disposition: 01-Home or Self Care       Patient Instructions:  Allergies as of 03/01/2024    No Known Allergies      Medication List     TAKE these medications    ibuprofen 200 MG tablet Commonly known as: ADVIL Take 400 mg by mouth 2 (two) times daily as needed for headache (pain).   morphine  30 MG 12 hr tablet Commonly known as: MS CONTIN  Take 1 tablet (30 mg total) by mouth every 12 (twelve) hours.   oxyCODONE -acetaminophen  7.5-325 MG tablet Commonly known as: Percocet Take 1 tablet by mouth every 8 (eight) hours as needed.       Activity: no driving while on analgesics and no heavy lifting for 4 weeks Diet: regular diet Wound Care: keep wound clean and dry  Follow-up in surgery clinic scheduled on 03/30/24 at 10:00am.  Signed: Leonor LITTIE Dawn 03/01/2024 2:56 PM

## 2024-03-01 NOTE — Plan of Care (Signed)

## 2024-03-01 NOTE — Discharge Instructions (Signed)
 "  [facility] DISCHARGE INSTRUCTIONS  Activity No heavy lifting greater than 15 pounds for 4 weeks after surgery. Ok to shower in 24 hours, but do not bathe or submerge incisions underwater. Do not drive while taking narcotic pain medication. You may drive when you are no longer taking prescription pain medication, you can comfortably wear a seatbelt, and you can safely maneuver your car and apply brakes.  Wound Care Your incisions are covered with skin glue called Dermabond. This will peel off on its own over time. You may shower and allow warm soapy water to run over your incisions. Gently pat dry. Do not submerge your incision underwater until cleared by your surgeon. Monitor your incision for any new redness, tenderness, or drainage. Many patients will experience some swelling and bruising at the incisions.  Ice packs will help.  Swelling and bruising can take several days to resolve.   Medications A  prescription for pain medication may be given to you upon discharge.  Take your pain medication as prescribed, if needed.  If narcotic pain medicine is not needed, then you may take acetaminophen  (Tylenol ) or ibuprofen (Advil) as needed. It is common to experience some constipation if taking pain medication after surgery.  Increasing fluid intake and taking a stool softener (such as Colace) will usually help or prevent this problem from occurring.  A mild laxative (Milk of Magnesia or Miralax) should be taken according to package directions if there are no bowel movements after 48 hours. Take your usually prescribed medications unless otherwise directed. If you need a refill on your pain medication, please contact your pharmacy.  They will contact our office to request authorization. Prescriptions will not be filled after 5 pm or on weekends.  When to Call Us : Fever greater than 100.5 New redness, drainage, or swelling at incision site Severe pain, nausea, or  vomiting Persistent bleeding from incisions Jaundice (yellowing of the whites of the eyes or skin)  Follow-up You have a postop appointment scheduled with Puja Maczis, PA-C on January 20 at 10:00am. This will be at the [facility] Surgery office at [address], [address], [place], [place]. Please arrive about 30 minutes prior to your scheduled appointment time.  IF YOU HAVE DISABILITY OR FAMILY LEAVE FORMS, YOU MUST BRING THEM TO THE OFFICE FOR PROCESSING.   DO NOT GIVE THEM TO YOUR DOCTOR.  The clinic staff is available to answer your questions during regular business hours.  Please dont hesitate to call and ask to speak to one of the nurses for clinical concerns.  If you have a medical emergency, go to the nearest emergency room or call 911.  A surgeon from [facility] is always on call at the hospital  [address], [place], [place]  [address] ?  [address] [address], [place], [place]   [address] [phone] ? Toll Free: [phone] ? FAX [phone] Web site: [email or web address]      Managing Your Pain After Surgery Without Opioids    Thank you for participating in our program to help patients manage their pain after surgery without opioids. This is part of our effort to provide you with the best care possible, without exposing you or your family to the risk that opioids pose.  What pain can I expect after surgery? You can expect to have some pain after surgery. This is normal. The pain is typically worse the day after surgery, and quickly begins to get better. Many studies  have found that many patients are able to manage their pain after surgery with Over-the-Counter (OTC) medications such as Tylenol  and Motrin. If you have a condition that does not allow you to take Tylenol  or Motrin, notify your surgical team.  How will I manage my pain? The best strategy for controlling your pain after surgery is around the clock pain control  with Tylenol  (acetaminophen ) and Motrin (ibuprofen or Advil). Alternating these medications with each other allows you to maximize your pain control. In addition to Tylenol  and Motrin, you can use heating pads or ice packs on your incisions to help reduce your pain.  How will I alternate your regular strength over-the-counter pain medication? You will take a dose of pain medication every three hours. Start by taking 650 mg of Tylenol  (2 pills of 325 mg) 3 hours later take 600 mg of Motrin (3 pills of 200 mg) 3 hours after taking the Motrin take 650 mg of Tylenol  3 hours after that take 600 mg of Motrin.   - 1 -  See example - if your first dose of Tylenol  is at 12:00 PM   12:00 PM Tylenol  650 mg (2 pills of 325 mg)  3:00 PM Motrin 600 mg (3 pills of 200 mg)  6:00 PM Tylenol  650 mg (2 pills of 325 mg)  9:00 PM Motrin 600 mg (3 pills of 200 mg)  Continue alternating every 3 hours   We recommend that you follow this schedule around-the-clock for at least 3 days after surgery, or until you feel that it is no longer needed. Use the table on the last page of this handout to keep track of the medications you are taking. Important: Do not take more than 3000mg  of Tylenol  or 3200mg  of Motrin in a 24-hour period. Do not take ibuprofen/Motrin if you have a history of bleeding stomach ulcers, severe kidney disease, &/or actively taking a blood thinner  What if I still have pain? If you have pain that is not controlled with the over-the-counter pain medications (Tylenol  and Motrin or Advil) you might have what we call breakthrough pain. You will receive a prescription for a small amount of an opioid pain medication such as Oxycodone , Tramadol, or Tylenol  with Codeine. Use these opioid pills in the first 24 hours after surgery if you have breakthrough pain. Do not take more than 1 pill every 4-6 hours.  If you still have uncontrolled pain after using all opioid pills, don't hesitate to call our  staff using the number provided. We will help make sure you are managing your pain in the best way possible, and if necessary, we can provide a prescription for additional pain medication.   Day 1    Time  Name of Medication Number of pills taken  Amount of Acetaminophen   Pain Level   Comments  AM PM       AM PM       AM PM       AM PM       AM PM       AM PM       AM PM       AM PM       Total Daily amount of Acetaminophen  Do not take more than  3,000 mg per day      Day 2    Time  Name of Medication Number of pills taken  Amount of Acetaminophen   Pain Level   Comments  AM PM  AM PM       AM PM       AM PM       AM PM       AM PM       AM PM       AM PM       Total Daily amount of Acetaminophen  Do not take more than  3,000 mg per day      Day 3    Time  Name of Medication Number of pills taken  Amount of Acetaminophen   Pain Level   Comments  AM PM       AM PM       AM PM       AM PM         AM PM       AM PM       AM PM       AM PM       Total Daily amount of Acetaminophen  Do not take more than  3,000 mg per day      Day 4    Time  Name of Medication Number of pills taken  Amount of Acetaminophen   Pain Level   Comments  AM PM       AM PM       AM PM       AM PM       AM PM       AM PM       AM PM       AM PM       Total Daily amount of Acetaminophen  Do not take more than  3,000 mg per day      Day 5    Time  Name of Medication Number of pills taken  Amount of Acetaminophen   Pain Level   Comments  AM PM       AM PM       AM PM       AM PM       AM PM       AM PM       AM PM       AM PM       Total Daily amount of Acetaminophen  Do not take more than  3,000 mg per day      Day 6    Time  Name of Medication Number of pills taken  Amount of Acetaminophen   Pain Level  Comments  AM PM       AM PM       AM PM       AM PM       AM PM       AM PM       AM PM       AM PM       Total Daily amount of  Acetaminophen  Do not take more than  3,000 mg per day      Day 7    Time  Name of Medication Number of pills taken  Amount of Acetaminophen   Pain Level   Comments  AM PM       AM PM       AM PM       AM PM       AM PM       AM PM       AM PM       AM PM  Total Daily amount of Acetaminophen  Do not take more than  3,000 mg per day        For additional information about how and where to safely dispose of unused opioid medications - prankcrew.uy  Disclaimer: This document contains information and/or instructional materials adapted from [facility] for the typical patient with your condition. It does not replace medical advice from your health care provider because your experience may differ from that of the typical patient. Talk to your health care provider if you have any questions about this document, your condition or your treatment plan. Adapted from [facility]  "

## 2024-03-02 LAB — SURGICAL PATHOLOGY

## 2024-03-29 NOTE — Progress Notes (Signed)
 "  Subjective:    Patient ID: NAS WAFER, male    DOB: 06-Jun-1975, 49 y.o.   MRN: [id number]  HPI: Joshua [name] is a 49 y.o. male who returns for follow up appointment for chronic pain and medication refill. He states his pain is located in his lower back and occasionally radiates into his right lower extremity. SABRA He rates his pain 4. His current exercise regime is walking and performing stretching exercises.  Joshua [name] Morphine  equivalent is 93.75 MME.   Oral Swab was Performed today.      Pain Inventory Average Pain 5 Pain Right Now 4 My pain is sharp, stabbing, and aching  In the last 24 hours, has pain interfered with the following? General activity 3 Relation with others 4 Enjoyment of life 3 What TIME of day is your pain at its worst? daytime and evening Sleep (in general) Fair  Pain is worse with: walking, bending, sitting, and standing Pain improves with: rest, therapy/exercise, and medication Relief from Meds: 6  Family History  Problem Relation Age of Onset   Hypertension Mother    Diabetes Father    Social History   Socioeconomic History   Marital status: Significant Other    Spouse name: Not on file   Number of children: Not on file   Years of education: Not on file   Highest education level: Not on file  Occupational History   Occupation: Dump [employer]  Tobacco Use   Smoking status: Every Day    Current packs/day: 0.00    Average packs/day: 1 pack/day for 25.0 years (25.0 ttl pk-yrs)    Types: Cigarettes    Start date: 10/23/1988    Last attempt to quit: 10/23/2013    Years since quitting: 10.4   Smokeless tobacco: Former   Tobacco comments:    .5ppd cigarettes-11/07/20  Vaping Use   Vaping status: Never Used  Substance and Sexual Activity   Alcohol  use: No    Alcohol /week: 0.0 standard drinks of alcohol    Drug use: No   Sexual activity: Yes    Birth control/protection: Condom  Other Topics Concern   Not on file  Social History  Narrative   Not on file   Social Drivers of Health   Tobacco Use: High Risk (02/29/2024)   Patient History    Smoking Tobacco Use: Every Day    Smokeless Tobacco Use: Former    Passive Exposure: Not on Actuary Strain: Not on file  Food Insecurity: No Food Insecurity (02/28/2024)   Epic    Worried About Programme Researcher, Broadcasting/film/video in the Last Year: Never true    Ran Out of Food in the Last Year: Never true  Transportation Needs: No Transportation Needs (02/28/2024)   Epic    Lack of Transportation (Medical): No    Lack of Transportation (Non-Medical): No  Physical Activity: Not on file  Stress: Not on file  Social Connections: Unknown (07/09/2021)   Received from [facility]   Social Network    Social Network: Not on file  Depression (PHQ2-9): Low Risk (02/02/2024)   Depression (PHQ2-9)    PHQ-2 Score: 0  Alcohol  Screen: Not on file  Housing: Low Risk (02/28/2024)   Epic    Unable to Pay for Housing in the Last Year: No    Number of Times Moved in the Last Year: 0    Homeless in the Last Year: No  Utilities: Not At Risk (02/28/2024)   Epic  Threatened with loss of utilities: No  Health Literacy: Not on file   Past Surgical History:  Procedure Laterality Date   CHOLECYSTECTOMY N/A 02/29/2024   Procedure: LAPAROSCOPIC CHOLECYSTECTOMY WITH INTRAOPERATIVE CHOLANGIOGRAM;  Surgeon: Kinsinger, Herlene Righter, MD;  Location: [facility] OR;  Service: General;  Laterality: N/A;   KNEE SURGERY Left 2010   Past Surgical History:  Procedure Laterality Date   CHOLECYSTECTOMY N/A 02/29/2024   Procedure: LAPAROSCOPIC CHOLECYSTECTOMY WITH INTRAOPERATIVE CHOLANGIOGRAM;  Surgeon: Stevie Herlene Righter, MD;  Location: [facility] OR;  Service: General;  Laterality: N/A;   KNEE SURGERY Left 2010   Past Medical History:  Diagnosis Date   Disorders of sacrum    Displacement of lumbar intervertebral disc without myelopathy    Facet syndrome, lumbar    GERD (gastroesophageal reflux disease)     Lumbago    Thoracic or lumbosacral neuritis or radiculitis, unspecified    There were no vitals taken for this visit.  Opioid Risk Score:   Fall Risk Score:  `1  Depression screen [facility] 2/9     02/02/2024    8:13 AM 12/05/2023    9:35 AM 10/03/2023    8:46 AM 08/08/2023    8:39 AM 06/13/2023    8:16 AM 02/21/2023    8:29 AM 12/20/2022    8:57 AM  Depression screen PHQ 2/9  Decreased Interest  0 0 0 0 0 0  Down, Depressed, Hopeless 0 0 0 0 0 0 0  PHQ - 2 Score 0 0 0 0 0 0 0  Altered sleeping 0        PHQ-9 Score 0          Review of Systems  Musculoskeletal:  Positive for back pain.       Left leg pain from buttock down to foot.  All other systems reviewed and are negative.      Objective:   Physical Exam Vitals and nursing note reviewed.  Constitutional:      Appearance: Normal appearance.  Cardiovascular:     Rate and Rhythm: Normal rate and regular rhythm.     Pulses: Normal pulses.     Heart sounds: Normal heart sounds.  Pulmonary:     Effort: Pulmonary effort is normal.     Breath sounds: Normal breath sounds.  Musculoskeletal:     Comments: Normal Muscle Bulk and Muscle Testing Reveals:  Upper Extremities: Full ROM and Muscle Strength 5/5  Lumbar Paraspinal Tenderness: L-4-L-5 Lower Extremities: Full ROM and Muscle Strength 5/5 Arises from Table with ease Narrow Based  Gait     Skin:    General: Skin is warm and dry.  Neurological:     Mental Status: He is alert and oriented to person, place, and time.  Psychiatric:        Mood and Affect: Mood normal.        Behavior: Behavior normal.          Assessment & Plan:  1, Cervicalgia/ Cervical Radiculitis: No complaints today.  Gabapentin  discontinued due to adverse instructions. He verbalizes understanding. 04/02/2024 2.  Chronic low back pain/ Lumbar Radiculopathy: Continue with Home Exercise Regime. 04/02/2024 3 Degenerative disk disease lumbar spine: 04/02/2024 Refilled: Morphine  ( MS Contin ) 30 mg  one  tablet every 12 hours #60 and Percocet 7.5/325 mg one tablet every 8 hours prn #90. Second script e-scribe for the following month. 04/02/2024 We will continue the opioid monitoring program, this consists of regular clinic visits, examinations, urine drug screen, pill counts as well  as use of [place]  Controlled Substance Reporting system. A 12 month History has been reviewed on the [place]  Controlled Substance Reporting System on 04/02/2024. 4. Tobacco Abuse: Educated on smoking cessation: He verbalizes understanding. 04/02/2024 5.  Morbid Obesity: Continue with Diabetic Diet and HEP as tolerated. Continue to monitor. 04/02/2024   F/U in 2 months       "

## 2024-04-02 ENCOUNTER — Telehealth: Payer: Self-pay

## 2024-04-02 ENCOUNTER — Encounter: Attending: Physical Medicine & Rehabilitation | Admitting: Registered Nurse

## 2024-04-02 ENCOUNTER — Encounter: Payer: Self-pay | Admitting: Registered Nurse

## 2024-04-02 VITALS — BP 128/66 | HR 67 | Ht 68.0 in | Wt 256.6 lb

## 2024-04-02 DIAGNOSIS — M542 Cervicalgia: Secondary | ICD-10-CM | POA: Insufficient documentation

## 2024-04-02 DIAGNOSIS — Z5181 Encounter for therapeutic drug level monitoring: Secondary | ICD-10-CM | POA: Diagnosis not present

## 2024-04-02 DIAGNOSIS — G894 Chronic pain syndrome: Secondary | ICD-10-CM | POA: Diagnosis not present

## 2024-04-02 DIAGNOSIS — Z79891 Long term (current) use of opiate analgesic: Secondary | ICD-10-CM | POA: Diagnosis not present

## 2024-04-02 DIAGNOSIS — M545 Low back pain, unspecified: Secondary | ICD-10-CM | POA: Diagnosis not present

## 2024-04-02 DIAGNOSIS — G8929 Other chronic pain: Secondary | ICD-10-CM | POA: Diagnosis present

## 2024-04-02 MED ORDER — MORPHINE SULFATE ER 30 MG PO TBCR: 30.0000 mg | EXTENDED_RELEASE_TABLET | Freq: Two times a day (BID) | ORAL | 0 refills | Status: DC

## 2024-04-02 MED ORDER — OXYCODONE-ACETAMINOPHEN 7.5-325 MG PO TABS: 1.0000 | ORAL_TABLET | Freq: Three times a day (TID) | ORAL | 0 refills | Status: AC | PRN

## 2024-04-02 MED ORDER — OXYCODONE-ACETAMINOPHEN 7.5-325 MG PO TABS: 1.0000 | ORAL_TABLET | Freq: Three times a day (TID) | ORAL | 0 refills | Status: DC | PRN

## 2024-04-02 MED ORDER — MORPHINE SULFATE ER 30 MG PO TBCR: 30.0000 mg | EXTENDED_RELEASE_TABLET | Freq: Two times a day (BID) | ORAL | 0 refills | Status: AC

## 2024-04-02 NOTE — Telephone Encounter (Signed)
 Joshua [name] (Key: [id number]) PA Case ID #: [id number] DX Code:  G89.4, M51.26 Need Help? Call us  at [phone] Status sent iconSent to Plan today Drug oxyCODONE -Acetaminophen  7.5-325MG  tablets ePA cloud logo Form [facility] & Exchange Electronic PA Form [phone] NCPDP)

## 2024-04-02 NOTE — Telephone Encounter (Signed)
 Joshua [name] (Key: [id number]) PA Case ID #: [id number] DX Code:  G89.4, M51.26 Need Help? Call us  at [phone] Status sent iconSent to Plan today Drug Morphine  Sulfate 30MG  tablets ePA cloud logo Form [facility] & Exchange Electronic PA Form [phone] NCPDP)

## 2024-04-07 LAB — DRUG TOX MONITOR 1 W/CONF, ORAL FLD
Amphetamines: NEGATIVE ng/mL
Barbiturates: NEGATIVE ng/mL
Benzodiazepines: NEGATIVE ng/mL
Buprenorphine: NEGATIVE ng/mL
Cocaine: NEGATIVE ng/mL
Codeine: NEGATIVE ng/mL
Cotinine: 236.3 ng/mL — ABNORMAL HIGH
Dihydrocodeine: NEGATIVE ng/mL
Fentanyl: NEGATIVE ng/mL
Heroin Metabolite: NEGATIVE ng/mL
Hydrocodone: NEGATIVE ng/mL
Hydromorphone: NEGATIVE ng/mL
MARIJUANA: NEGATIVE ng/mL
MDMA: NEGATIVE ng/mL
Meprobamate: NEGATIVE ng/mL
Methadone: NEGATIVE ng/mL
Morphine: 12.5 ng/mL — ABNORMAL HIGH
Nicotine Metabolite: POSITIVE ng/mL — AB
Norhydrocodone: NEGATIVE ng/mL
Noroxycodone: 33.3 ng/mL — ABNORMAL HIGH
Opiates: POSITIVE ng/mL — AB
Oxycodone: 164.1 ng/mL — ABNORMAL HIGH
Oxymorphone: NEGATIVE ng/mL
Phencyclidine: NEGATIVE ng/mL
Tapentadol: NEGATIVE ng/mL
Tramadol: NEGATIVE ng/mL
Zolpidem: NEGATIVE ng/mL

## 2024-04-07 LAB — DRUG TOX ALC METAB W/CON, ORAL FLD: Alcohol Metabolite: NEGATIVE ng/mL

## 2024-04-08 NOTE — Telephone Encounter (Signed)
 This request has been deemed withdrawn because review of records and plan benefit coverage shows the drug is covered without prior auth. The dispensing pharmacy may enter the appropriate clarification codes to override the rejection.

## 2024-05-28 ENCOUNTER — Encounter: Admitting: Registered Nurse

## 2024-07-30 ENCOUNTER — Encounter: Admitting: Registered Nurse
